# Patient Record
Sex: Female | Born: 1950 | Race: White | Hispanic: No | Smoking: Never smoker
Health system: Southern US, Community
[De-identification: ages and names within clinical notes are randomized; demographics above are authoritative.]

## PROBLEM LIST (undated history)

## (undated) DIAGNOSIS — F32A Depression, unspecified: Secondary | ICD-10-CM

## (undated) DIAGNOSIS — I509 Heart failure, unspecified: Secondary | ICD-10-CM

## (undated) DIAGNOSIS — I2510 Atherosclerotic heart disease of native coronary artery without angina pectoris: Secondary | ICD-10-CM

## (undated) DIAGNOSIS — E119 Type 2 diabetes mellitus without complications: Secondary | ICD-10-CM

## (undated) DIAGNOSIS — I4891 Unspecified atrial fibrillation: Secondary | ICD-10-CM

## (undated) DIAGNOSIS — F419 Anxiety disorder, unspecified: Secondary | ICD-10-CM

## (undated) DIAGNOSIS — K219 Gastro-esophageal reflux disease without esophagitis: Secondary | ICD-10-CM

## (undated) DIAGNOSIS — I499 Cardiac arrhythmia, unspecified: Secondary | ICD-10-CM

## (undated) DIAGNOSIS — I10 Essential (primary) hypertension: Secondary | ICD-10-CM

## (undated) DIAGNOSIS — E785 Hyperlipidemia, unspecified: Secondary | ICD-10-CM

## (undated) HISTORY — DX: Gastro-esophageal reflux disease without esophagitis: K21.9

## (undated) HISTORY — DX: Atherosclerotic heart disease of native coronary artery without angina pectoris: I25.10

## (undated) HISTORY — DX: Unspecified atrial fibrillation: I48.91

## (undated) HISTORY — DX: Hyperlipidemia, unspecified: E78.5

## (undated) HISTORY — PX: TRIGGER FINGER RELEASE: SHX641

## (undated) HISTORY — PX: CATARACT EXTRACTION, BILATERAL: SHX1313

## (undated) HISTORY — DX: Type 2 diabetes mellitus without complications: E11.9

## (undated) HISTORY — DX: Cardiac arrhythmia, unspecified: I49.9

## (undated) HISTORY — DX: Essential (primary) hypertension: I10

## (undated) HISTORY — DX: Depression, unspecified: F32.A

## (undated) HISTORY — PX: BELPHAROPTOSIS REPAIR: SHX369

## (undated) HISTORY — DX: Anxiety disorder, unspecified: F41.9

## (undated) HISTORY — DX: Heart failure, unspecified: I50.9

---

## 2005-09-27 ENCOUNTER — Ambulatory Visit: Payer: Self-pay | Admitting: Family Medicine

## 2006-02-15 ENCOUNTER — Ambulatory Visit: Payer: Self-pay | Admitting: Family Medicine

## 2008-04-30 ENCOUNTER — Ambulatory Visit: Payer: Self-pay | Admitting: Family Medicine

## 2013-11-06 DIAGNOSIS — IMO0001 Reserved for inherently not codable concepts without codable children: Secondary | ICD-10-CM | POA: Insufficient documentation

## 2014-08-04 DIAGNOSIS — H269 Unspecified cataract: Secondary | ICD-10-CM | POA: Insufficient documentation

## 2016-12-27 DIAGNOSIS — H2513 Age-related nuclear cataract, bilateral: Secondary | ICD-10-CM | POA: Insufficient documentation

## 2017-02-14 DIAGNOSIS — H2512 Age-related nuclear cataract, left eye: Secondary | ICD-10-CM | POA: Insufficient documentation

## 2017-07-10 DIAGNOSIS — H02403 Unspecified ptosis of bilateral eyelids: Secondary | ICD-10-CM | POA: Insufficient documentation

## 2018-08-20 ENCOUNTER — Ambulatory Visit
Admission: EM | Admit: 2018-08-20 | Discharge: 2018-08-20 | Disposition: A | Discharge status: Home / Self Care | Payer: BC Managed Care – PPO | Attending: Family Medicine | Admitting: Family Medicine

## 2018-08-20 ENCOUNTER — Other Ambulatory Visit: Payer: Self-pay

## 2018-08-20 DIAGNOSIS — R197 Diarrhea, unspecified: Secondary | ICD-10-CM

## 2018-08-20 LAB — COMPREHENSIVE METABOLIC PANEL
ALK PHOS: 103 U/L (ref 38–126)
ALT: 22 U/L (ref 0–44)
AST: 18 U/L (ref 15–41)
Albumin: 4.1 g/dL (ref 3.5–5.0)
Anion gap: 10 (ref 5–15)
BUN: 17 mg/dL (ref 8–23)
CO2: 25 mmol/L (ref 22–32)
CREATININE: 0.91 mg/dL (ref 0.44–1.00)
Calcium: 9.2 mg/dL (ref 8.9–10.3)
Chloride: 97 mmol/L — ABNORMAL LOW (ref 98–111)
GFR calc Af Amer: >60 mL/min (ref >60)
Glucose, Bld: 333 mg/dL — ABNORMAL HIGH (ref 70–99)
Potassium: 4 mmol/L (ref 3.5–5.1)
Sodium: 132 mmol/L — ABNORMAL LOW (ref 135–145)
Total Bilirubin: 0.4 mg/dL (ref 0.3–1.2)
Total Protein: 7.6 g/dL (ref 6.5–8.1)

## 2018-08-20 LAB — CBC WITH DIFFERENTIAL/PLATELET
BASOS ABS: 0.1 10*3/uL (ref 0–0.1)
Basophils Relative: 1 %
Eosinophils Absolute: 0.1 10*3/uL (ref 0–0.7)
Eosinophils Relative: 2 %
HEMATOCRIT: 44 % (ref 35.0–47.0)
HEMOGLOBIN: 15.1 g/dL (ref 12.0–16.0)
LYMPHS PCT: 23 %
Lymphs Abs: 1.7 10*3/uL (ref 1.0–3.6)
MCH: 29.6 pg (ref 26.0–34.0)
MCHC: 34.3 g/dL (ref 32.0–36.0)
MCV: 86.4 fL (ref 80.0–100.0)
MONO ABS: 0.6 10*3/uL (ref 0.2–0.9)
Monocytes Relative: 8 %
NEUTROS ABS: 5.1 10*3/uL (ref 1.4–6.5)
Neutrophils Relative %: 66 %
Platelets: 296 10*3/uL (ref 150–440)
RBC: 5.09 MIL/uL (ref 3.80–5.20)
RDW: 12.9 % (ref 11.5–14.5)
WBC: 7.6 10*3/uL (ref 3.6–11.0)

## 2018-08-20 NOTE — ED Triage Notes (Signed)
Patient complains of diarrhea x 3 weeks. Patient states that she had a 1/2 teaspoon of bad egg salad the day before symptoms started. Patient states that this has been occurring after eating. Patient states that she did not have any symptoms over the last weekend but symptoms returned last night.

## 2018-08-20 NOTE — Discharge Instructions (Signed)
Imodium AD over the counter as needed Clear liquids, then advance diet slowly as tolerated

## 2019-02-16 ENCOUNTER — Other Ambulatory Visit: Payer: Self-pay

## 2019-02-16 ENCOUNTER — Encounter: Payer: Self-pay | Admitting: Emergency Medicine

## 2019-02-16 ENCOUNTER — Emergency Department
Admission: EM | Admit: 2019-02-16 | Discharge: 2019-02-16 | Disposition: A | Discharge status: Home / Self Care | Payer: Medicare Other | Attending: Emergency Medicine | Admitting: Emergency Medicine

## 2019-02-16 DIAGNOSIS — E119 Type 2 diabetes mellitus without complications: Secondary | ICD-10-CM | POA: Diagnosis not present

## 2019-02-16 DIAGNOSIS — R197 Diarrhea, unspecified: Secondary | ICD-10-CM | POA: Diagnosis present

## 2019-02-16 DIAGNOSIS — Z794 Long term (current) use of insulin: Secondary | ICD-10-CM | POA: Insufficient documentation

## 2019-02-16 DIAGNOSIS — K529 Noninfective gastroenteritis and colitis, unspecified: Secondary | ICD-10-CM | POA: Diagnosis not present

## 2019-02-16 DIAGNOSIS — Z79899 Other long term (current) drug therapy: Secondary | ICD-10-CM | POA: Insufficient documentation

## 2019-02-16 LAB — COMPREHENSIVE METABOLIC PANEL
ALT: 12 U/L (ref 0–44)
ANION GAP: 12 (ref 5–15)
AST: 12 U/L — ABNORMAL LOW (ref 15–41)
Albumin: 3.6 g/dL (ref 3.5–5.0)
Alkaline Phosphatase: 70 U/L (ref 38–126)
BILIRUBIN TOTAL: 0.8 mg/dL (ref 0.3–1.2)
BUN: 21 mg/dL (ref 8–23)
CALCIUM: 8.7 mg/dL — AB (ref 8.9–10.3)
CO2: 21 mmol/L — ABNORMAL LOW (ref 22–32)
CREATININE: 0.84 mg/dL (ref 0.44–1.00)
Chloride: 101 mmol/L (ref 98–111)
Glucose, Bld: 445 mg/dL — ABNORMAL HIGH (ref 70–99)
Potassium: 4 mmol/L (ref 3.5–5.1)
Sodium: 134 mmol/L — ABNORMAL LOW (ref 135–145)
Total Protein: 7.1 g/dL (ref 6.5–8.1)

## 2019-02-16 LAB — URINALYSIS, COMPLETE (UACMP) WITH MICROSCOPIC
BILIRUBIN URINE: NEGATIVE
Bacteria, UA: NONE SEEN
HGB URINE DIPSTICK: NEGATIVE
Ketones, ur: 20 mg/dL — AB
LEUKOCYTE UA: NEGATIVE
NITRITE: NEGATIVE
PH: 6 (ref 5.0–8.0)
Protein, ur: NEGATIVE mg/dL
SPECIFIC GRAVITY, URINE: 1.022 (ref 1.005–1.030)

## 2019-02-16 LAB — CBC WITH DIFFERENTIAL/PLATELET
Abs Immature Granulocytes: 0.04 10*3/uL (ref 0.00–0.07)
BASOS PCT: 1 %
Basophils Absolute: 0 10*3/uL (ref 0.0–0.1)
EOS ABS: 0 10*3/uL (ref 0.0–0.5)
EOS PCT: 0 %
HEMATOCRIT: 37 % (ref 36.0–46.0)
Hemoglobin: 12.6 g/dL (ref 12.0–15.0)
Immature Granulocytes: 1 %
LYMPHS ABS: 0.7 10*3/uL (ref 0.7–4.0)
Lymphocytes Relative: 10 %
MCH: 29.5 pg (ref 26.0–34.0)
MCHC: 34.1 g/dL (ref 30.0–36.0)
MCV: 86.7 fL (ref 80.0–100.0)
MONO ABS: 0.3 10*3/uL (ref 0.1–1.0)
MONOS PCT: 4 %
Neutro Abs: 5.9 10*3/uL (ref 1.7–7.7)
Neutrophils Relative %: 84 %
Platelets: 258 10*3/uL (ref 150–400)
RBC: 4.27 MIL/uL (ref 3.87–5.11)
RDW: 12.7 % (ref 11.5–15.5)
WBC: 7 10*3/uL (ref 4.0–10.5)
nRBC: 0 % (ref 0.0–0.2)

## 2019-02-16 LAB — GLUCOSE, CAPILLARY
GLUCOSE-CAPILLARY: 389 mg/dL — AB (ref 70–99)
GLUCOSE-CAPILLARY: 329 mg/dL — AB (ref 70–99)

## 2019-02-16 LAB — TROPONIN I: Troponin I: <0.03 ng/mL (ref <0.03)

## 2019-02-16 MED ORDER — SODIUM CHLORIDE 0.9 % IV BOLUS
1000.0000 mL | Freq: Once | INTRAVENOUS | Status: AC
  Administered 2019-02-16: 1000 mL via INTRAVENOUS

## 2019-02-16 MED ORDER — ONDANSETRON HCL 4 MG/2ML IJ SOLN
4.0000 mg | Freq: Once | INTRAMUSCULAR | Status: AC
  Administered 2019-02-16: 4 mg via INTRAVENOUS
  Filled 2019-02-16: qty 2

## 2019-02-16 MED ORDER — SODIUM CHLORIDE 0.9 % IV BOLUS
500.0000 mL | Freq: Once | INTRAVENOUS | Status: AC
  Administered 2019-02-16: 500 mL via INTRAVENOUS

## 2019-02-16 MED ORDER — PROMETHAZINE HCL 25 MG/ML IJ SOLN: 12.5000 mg | Freq: Once | INTRAMUSCULAR | Status: DC

## 2019-02-16 MED ORDER — PROMETHAZINE HCL 12.5 MG PO TABS: 12.5000 mg | ORAL_TABLET | Freq: Four times a day (QID) | ORAL | 0 refills | Status: DC | PRN

## 2019-02-16 NOTE — ED Notes (Signed)
Pt verbalized understanding of discharge instructions. NAD at this time. 

## 2019-02-16 NOTE — ED Notes (Signed)
Dt Ginger Ale given for PO challenge.

## 2019-02-16 NOTE — Discharge Instructions (Signed)
Continue to  check your sugars and take your insulin as prescribed drink plenty of fluids that do not contain sugars, and return to the emergency room for any new or worrisome symptoms including abdominal pain vomiting or fever or bleeding.

## 2019-02-16 NOTE — ED Notes (Signed)
MD McShane at bedside  

## 2019-02-16 NOTE — ED Triage Notes (Signed)
Pt to ED by EMS with c/o of N/V and Hypoglycemia. Pt took 70/30 insulin at approx 11:30. Upon EMS arrival pt's CBG 430.

## 2019-02-16 NOTE — ED Provider Notes (Addendum)
[facility] Emergency Department Provider Note  ____________________________________________   I have reviewed the triage vital signs and the nursing notes. Where available I have reviewed prior notes and, if possible and indicated, outside hospital notes.    HISTORY  Chief Complaint Hyperglycemia    HPI Kylie Thomas is a 68 y.o. female  History of irritable bowel syndrome triggered by stress, history of diabetes mellitus with a hemoglobin A1c she describes as being around 8 or so, history of insulin-dependent diabetes, she had some diarrhea for last couple days and a few episodes of dry heaving today and she feels nauseated.  She denies any fever or chills, she does not any focal abdominal pain.  No melena bright red blood per rectum no hematemesis.  No other alleviating or aggravating symptoms.  No chest pain no exertional symptoms no shortness of breath. *   History reviewed. No pertinent past medical history.  There are no active problems to display for this patient.   Past Surgical History:  Procedure Laterality Date  . BELPHAROPTOSIS REPAIR    . CATARACT EXTRACTION, BILATERAL    . CESAREAN SECTION    . TRIGGER FINGER RELEASE     x 7    Prior to Admission medications   Medication Sig Start Date End Date Taking? Authorizing Provider  buPROPion (WELLBUTRIN XL) 150 MG 24 hr tablet Take 150 mg by mouth 2 (two) times daily. 06/03/18   [provider]  dorzolamide-timolol (COSOPT) 22.3-6.8 MG/ML ophthalmic solution INSTILL 1 DROP INTO AFFECTED EYE(S) TWICE DAILY 07/08/18   [provider]  DULoxetine (CYMBALTA) 60 MG capsule Take 60 mg by mouth daily. 06/03/18   [provider]  gabapentin (NEURONTIN) 300 MG capsule Take by mouth.    [provider]  glucose blood (ONE TOUCH ULTRA TEST) test strip OneTouch Ultra Blue Test Strip    [provider]  Insulin Detemir (LEVEMIR FLEXTOUCH) 100 UNIT/ML Pen every morning  before breakfast.    [provider]  rosuvastatin (CRESTOR) 10 MG tablet Take 10 mg by mouth daily. 06/12/18   [provider]  TRULICITY 1.5 MG/0.5ML SOPN INJECT 1 PEN WEEKLY AS DIRECTED 07/01/18   [provider]    Allergies Atorvastatin and Sulfa antibiotics  Family History  Problem Relation Age of Onset  . Heart disease Mother   . Diabetes Father   . Heart attack Father     Social History Social History   Tobacco Use  . Smoking status: Never Smoker  . Smokeless tobacco: Never Used  Substance Use Topics  . Alcohol use: Not Currently  . Drug use: Not Currently    Review of Systems Constitutional: No fever/chills Eyes: No visual changes. ENT: No sore throat. No stiff neck no neck pain Cardiovascular: Denies chest pain. Respiratory: Denies shortness of breath. Gastrointestinal:   no vomiting.  No diarrhea.  No constipation. Genitourinary: Negative for dysuria. Musculoskeletal: Negative lower extremity swelling Skin: Negative for rash. Neurological: Negative for severe headaches, focal weakness or numbness.   ____________________________________________   PHYSICAL EXAM:  VITAL SIGNS: ED Triage Vitals  Enc Vitals Group     BP 02/16/19 1306 (!) 180/87     Pulse Rate 02/16/19 1306 (!) 103     Resp 02/16/19 1306 14     Temp 02/16/19 1306 97.6 F (36.4 C)     Temp Source 02/16/19 1306 Oral     SpO2 02/16/19 1306 100 %     Weight 02/16/19 1301 105 lb (47.6  kg)     Height 02/16/19 1301 5' (1.524 m)     Head Circumference --      Peak Flow --      Pain Score 02/16/19 1259 0     Pain Loc --      Pain Edu? --      Excl. in GC? --     Constitutional: Alert and oriented. Well appearing and in no acute distress. Eyes: Conjunctivae are normal Head: Atraumatic HEENT: No congestion/rhinnorhea. Mucous membranes are moist.  Oropharynx non-erythematous Neck:   Nontender with no meningismus, no masses, no stridor Cardiovascular: Normal rate,  regular rhythm. Grossly normal heart sounds.  Good peripheral circulation. Respiratory: Normal respiratory effort.  No retractions. Lungs CTAB. Abdominal: Soft and nontender. No distention. No guarding no rebound Back:  There is no focal tenderness or step off.  there is no midline tenderness there are no lesions noted. there is no CVA tenderness Musculoskeletal: No lower extremity tenderness, no upper extremity tenderness. No joint effusions, no DVT signs strong distal pulses no edema Neurologic:  Normal speech and language. No gross focal neurologic deficits are appreciated.  Skin:  Skin is warm, dry and intact. No rash noted. Psychiatric: Mood and affect are normal. Speech and behavior are normal.  ____________________________________________   LABS (all labs ordered are listed, but only abnormal results are displayed)  Labs Reviewed  GLUCOSE, CAPILLARY - Abnormal; Notable for the following components:      Result Value   Glucose-Capillary 389 (*)    All other components within normal limits  BLOOD GAS, VENOUS - Abnormal; Notable for the following components:   pCO2, Ven 40 (*)    All other components within normal limits  CBC WITH DIFFERENTIAL/PLATELET  COMPREHENSIVE METABOLIC PANEL  URINALYSIS, COMPLETE (UACMP) WITH MICROSCOPIC  TROPONIN I    Pertinent labs  results that were available during my care of the patient were reviewed by me and considered in my medical decision making (see chart for details). ____________________________________________  EKG  I personally interpreted any EKGs ordered by me or triage Sinus rhythm rate 103, tachycardia noted, normal axis, no acute ST elevation or depression.  Borderline prolonged QT ____________________________________________  RADIOLOGY  Pertinent labs & imaging results that were available during my care of the patient were reviewed by me and considered in my medical decision making (see chart for details). If possible, patient  and/or family made aware of any abnormal findings.  No results found. ____________________________________________    PROCEDURES  Procedure(s) performed: None  Procedures  Critical Care performed: None  ____________________________________________   INITIAL IMPRESSION / ASSESSMENT AND PLAN / ED COURSE  Pertinent labs & imaging results that were available during my care of the patient were reviewed by me and considered in my medical decision making (see chart for details).  Patient here with nausea and vomiting, abdomen is benign, she seems somewhat anxious but otherwise well appearing, she has not actually vomited here she states she feels nauseated.  Low suspicion for ACS but we did send cardiac enzymes and EKG.  Belly is nonsurgical.  She is with diarrhea and vomiting large community burden of same this is most likely a viral pathology we will see if we get her feeling better.  His blood sugars are somewhat elevated here,, we will send a VBG to ensure that she is not in DKA and we will give her IV fluids.  ----------------------------------------- 2:51 PM on 02/16/2019 -----------------------------------------  Peers trending down patient is in no acute distress  tolerating p.o. requesting discharge, abdomen remains benign.  We talked about admission versus discharge she is strongly of the opinion that she would like to go home.  No evidence of DKA, she has not actually vomited or had any diarrhea here.  She looks quite well and she is texting on her telephone and requesting discharge.  We will discharge her with close outpatient follow-up return precautions given and understood.  Her blood sugar somewhat elevated still however it is trending down and again I do not want a be too aggressive in dropping it as she will take her insulin soon this evening.   ____________________________________________   FINAL CLINICAL IMPRESSION(S) / ED DIAGNOSES  Final diagnoses:  None       This chart was dictated using voice recognition software.  Despite best efforts to proofread,  errors can occur which can change meaning.      Jeanmarie PlantMcShane,  A, MD 02/16/19 1347    Jeanmarie PlantMcShane,  A, MD 02/16/19 [phone]

## 2019-02-25 LAB — BLOOD GAS, VENOUS
Bicarbonate: 23.7 mmol/L (ref 20.0–28.0)
PCO2 VEN: 40 mmHg — AB (ref 44.0–60.0)
pH, Ven: 7.38 (ref 7.250–7.430)

## 2020-04-05 ENCOUNTER — Ambulatory Visit: Payer: Self-pay | Admitting: Podiatry

## 2020-04-14 ENCOUNTER — Encounter: Payer: Self-pay | Admitting: Podiatry

## 2020-04-14 ENCOUNTER — Other Ambulatory Visit: Payer: Self-pay

## 2020-04-14 ENCOUNTER — Ambulatory Visit: Payer: Medicare PPO | Admitting: Podiatry

## 2020-04-14 DIAGNOSIS — M79676 Pain in unspecified toe(s): Secondary | ICD-10-CM | POA: Diagnosis not present

## 2020-04-14 DIAGNOSIS — B351 Tinea unguium: Secondary | ICD-10-CM

## 2020-04-14 DIAGNOSIS — M6530 Trigger finger, unspecified finger: Secondary | ICD-10-CM | POA: Insufficient documentation

## 2020-04-14 DIAGNOSIS — M19049 Primary osteoarthritis, unspecified hand: Secondary | ICD-10-CM | POA: Insufficient documentation

## 2020-04-14 NOTE — Progress Notes (Signed)
  Subjective:  Patient ID: Kylie Thomas, female    DOB: 1951-02-14,  MRN: [id number] HPI Chief Complaint  Patient presents with  . Diabetes    Foot exam - trim nails if needed today and would like to start coming every 6 months for nail care  . New Patient (Initial Visit)    69 y.o. female presents with the above complaint.   ROS: Denies fever chills nausea vomiting muscle aches pains calf pain back pain chest pain or headache.  No past medical history on file. Past Surgical History:  Procedure Laterality Date  . BELPHAROPTOSIS REPAIR    . CATARACT EXTRACTION, BILATERAL    . CESAREAN SECTION    . TRIGGER FINGER RELEASE     x 7    Current Outpatient Medications:  .  aspirin 325 MG tablet, Take 325 mg by mouth daily., Disp: , Rfl:  .  lisinopril (ZESTRIL) 10 MG tablet, Take 10 mg by mouth daily., Disp: , Rfl:  .  pantoprazole (PROTONIX) 20 MG tablet, Take 20 mg by mouth daily., Disp: , Rfl:  .  Pioglitazone HCl (ACTOS PO), Take by mouth., Disp: , Rfl:  .  buPROPion (WELLBUTRIN XL) 150 MG 24 hr tablet, Take 150 mg by mouth 2 (two) times daily., Disp: , Rfl: 2 .  dorzolamide-timolol (COSOPT) 22.3-6.8 MG/ML ophthalmic solution, INSTILL 1 DROP INTO AFFECTED EYE(S) TWICE DAILY, Disp: , Rfl: 0 .  DULoxetine (CYMBALTA) 60 MG capsule, Take 60 mg by mouth daily., Disp: , Rfl: 5 .  gabapentin (NEURONTIN) 300 MG capsule, Take by mouth., Disp: , Rfl:  .  glucose blood (ONE TOUCH ULTRA TEST) test strip, OneTouch Ultra Blue Test Strip, Disp: , Rfl:  .  Insulin Detemir (LEVEMIR FLEXTOUCH) 100 UNIT/ML Pen, every morning before breakfast., Disp: , Rfl:  .  TRULICITY 1.5 MG/0.5ML SOPN, INJECT 1 PEN WEEKLY AS DIRECTED, Disp: , Rfl: 5  Allergies  Allergen Reactions  . Atorvastatin Other (See Comments)    Other reaction(s): Arthralgia (Joint Pain) Severe cramps  . Remifentanil     Other reaction(s): Irregular Heart Rate  . Fentanyl   . Sulfa Antibiotics Nausea Only and Other (See Comments)    Review of Systems Objective:  There were no vitals filed for this visit.  General: Well developed, nourished, in no acute distress, alert and oriented x3   Dermatological: Skin is warm, dry and supple bilateral. Nails x 10 are thick yellow and dystrophic possibly mycotic.; remaining integument appears unremarkable at this time. There are no open sores, no preulcerative lesions, no rash or signs of infection present.  Vascular: Dorsalis Pedis artery and Posterior Tibial artery pedal pulses are 2/4 bilateral with immedate capillary fill time. Pedal hair growth present. No varicosities and no lower extremity edema present bilateral.   Neruologic: Grossly intact via light touch bilateral. Vibratory intact via tuning fork bilateral. Protective threshold with Semmes Wienstein monofilament intact to all pedal sites bilateral. Patellar and Achilles deep tendon reflexes 2+ bilateral. No Babinski or clonus noted bilateral.   Musculoskeletal: No gross boney pedal deformities bilateral. No pain, crepitus, or limitation noted with foot and ankle range of motion bilateral. Muscular strength 5/5 in all groups tested bilateral.  Gait: Unassisted, Nonantalgic.    Radiographs:  None taken  Assessment & Plan:   Assessment: Pain in limb secondary to onychomycosis.  Diabetes mellitus type 2 without complications.  Plan: Debrided toenails 1 through 5 bilateral.     Worth Kober T. [place], [place]

## 2020-10-18 ENCOUNTER — Ambulatory Visit: Payer: Medicare PPO | Admitting: Podiatry

## 2021-01-19 ENCOUNTER — Ambulatory Visit: Payer: Medicare PPO | Admitting: Podiatry

## 2021-05-15 ENCOUNTER — Inpatient Hospital Stay (HOSPITAL_COMMUNITY)
Admit: 2021-05-15 | Discharge: 2021-05-15 | Disposition: A | Discharge status: Home / Self Care | Payer: Medicare HMO | Attending: Internal Medicine | Admitting: Internal Medicine

## 2021-05-15 ENCOUNTER — Inpatient Hospital Stay
Admission: EM | Admit: 2021-05-15 | Discharge: 2021-05-19 | DRG: 286 | Disposition: A | Discharge status: Home / Self Care | Payer: Medicare HMO | Attending: Internal Medicine | Admitting: Internal Medicine

## 2021-05-15 ENCOUNTER — Emergency Department: Payer: Medicare HMO

## 2021-05-15 DIAGNOSIS — Z882 Allergy status to sulfonamides status: Secondary | ICD-10-CM

## 2021-05-15 DIAGNOSIS — I252 Old myocardial infarction: Secondary | ICD-10-CM | POA: Diagnosis not present

## 2021-05-15 DIAGNOSIS — E1169 Type 2 diabetes mellitus with other specified complication: Secondary | ICD-10-CM | POA: Diagnosis present

## 2021-05-15 DIAGNOSIS — I5043 Acute on chronic combined systolic (congestive) and diastolic (congestive) heart failure: Secondary | ICD-10-CM | POA: Diagnosis present

## 2021-05-15 DIAGNOSIS — R6510 Systemic inflammatory response syndrome (SIRS) of non-infectious origin without acute organ dysfunction: Secondary | ICD-10-CM | POA: Diagnosis not present

## 2021-05-15 DIAGNOSIS — Z8249 Family history of ischemic heart disease and other diseases of the circulatory system: Secondary | ICD-10-CM

## 2021-05-15 DIAGNOSIS — T68XXXA Hypothermia, initial encounter: Secondary | ICD-10-CM | POA: Diagnosis present

## 2021-05-15 DIAGNOSIS — N1831 Chronic kidney disease, stage 3a: Secondary | ICD-10-CM | POA: Diagnosis present

## 2021-05-15 DIAGNOSIS — Z833 Family history of diabetes mellitus: Secondary | ICD-10-CM

## 2021-05-15 DIAGNOSIS — Z87891 Personal history of nicotine dependence: Secondary | ICD-10-CM | POA: Diagnosis not present

## 2021-05-15 DIAGNOSIS — I480 Paroxysmal atrial fibrillation: Secondary | ICD-10-CM | POA: Diagnosis present

## 2021-05-15 DIAGNOSIS — Z7982 Long term (current) use of aspirin: Secondary | ICD-10-CM

## 2021-05-15 DIAGNOSIS — I5023 Acute on chronic systolic (congestive) heart failure: Secondary | ICD-10-CM

## 2021-05-15 DIAGNOSIS — E1165 Type 2 diabetes mellitus with hyperglycemia: Secondary | ICD-10-CM | POA: Diagnosis present

## 2021-05-15 DIAGNOSIS — Z955 Presence of coronary angioplasty implant and graft: Secondary | ICD-10-CM

## 2021-05-15 DIAGNOSIS — K219 Gastro-esophageal reflux disease without esophagitis: Secondary | ICD-10-CM | POA: Diagnosis present

## 2021-05-15 DIAGNOSIS — N183 Chronic kidney disease, stage 3 unspecified: Secondary | ICD-10-CM | POA: Diagnosis present

## 2021-05-15 DIAGNOSIS — Z79899 Other long term (current) drug therapy: Secondary | ICD-10-CM

## 2021-05-15 DIAGNOSIS — Z7901 Long term (current) use of anticoagulants: Secondary | ICD-10-CM

## 2021-05-15 DIAGNOSIS — Z794 Long term (current) use of insulin: Secondary | ICD-10-CM

## 2021-05-15 DIAGNOSIS — Z7902 Long term (current) use of antithrombotics/antiplatelets: Secondary | ICD-10-CM

## 2021-05-15 DIAGNOSIS — I482 Chronic atrial fibrillation, unspecified: Secondary | ICD-10-CM | POA: Diagnosis present

## 2021-05-15 DIAGNOSIS — J9601 Acute respiratory failure with hypoxia: Secondary | ICD-10-CM | POA: Diagnosis not present

## 2021-05-15 DIAGNOSIS — N390 Urinary tract infection, site not specified: Secondary | ICD-10-CM | POA: Diagnosis present

## 2021-05-15 DIAGNOSIS — E785 Hyperlipidemia, unspecified: Secondary | ICD-10-CM | POA: Diagnosis not present

## 2021-05-15 DIAGNOSIS — F418 Other specified anxiety disorders: Secondary | ICD-10-CM | POA: Diagnosis present

## 2021-05-15 DIAGNOSIS — E1122 Type 2 diabetes mellitus with diabetic chronic kidney disease: Secondary | ICD-10-CM | POA: Diagnosis present

## 2021-05-15 DIAGNOSIS — E1129 Type 2 diabetes mellitus with other diabetic kidney complication: Secondary | ICD-10-CM | POA: Diagnosis present

## 2021-05-15 DIAGNOSIS — J9621 Acute and chronic respiratory failure with hypoxia: Secondary | ICD-10-CM | POA: Diagnosis present

## 2021-05-15 DIAGNOSIS — F419 Anxiety disorder, unspecified: Secondary | ICD-10-CM | POA: Diagnosis present

## 2021-05-15 DIAGNOSIS — I130 Hypertensive heart and chronic kidney disease with heart failure and stage 1 through stage 4 chronic kidney disease, or unspecified chronic kidney disease: Secondary | ICD-10-CM | POA: Diagnosis present

## 2021-05-15 DIAGNOSIS — Z9981 Dependence on supplemental oxygen: Secondary | ICD-10-CM

## 2021-05-15 DIAGNOSIS — F32A Depression, unspecified: Secondary | ICD-10-CM | POA: Diagnosis present

## 2021-05-15 DIAGNOSIS — R778 Other specified abnormalities of plasma proteins: Secondary | ICD-10-CM | POA: Diagnosis present

## 2021-05-15 DIAGNOSIS — I2510 Atherosclerotic heart disease of native coronary artery without angina pectoris: Secondary | ICD-10-CM | POA: Diagnosis present

## 2021-05-15 DIAGNOSIS — I10 Essential (primary) hypertension: Secondary | ICD-10-CM | POA: Diagnosis present

## 2021-05-15 DIAGNOSIS — Z20822 Contact with and (suspected) exposure to covid-19: Secondary | ICD-10-CM | POA: Diagnosis present

## 2021-05-15 DIAGNOSIS — I509 Heart failure, unspecified: Secondary | ICD-10-CM | POA: Insufficient documentation

## 2021-05-15 DIAGNOSIS — D72829 Elevated white blood cell count, unspecified: Secondary | ICD-10-CM | POA: Diagnosis present

## 2021-05-15 DIAGNOSIS — I2584 Coronary atherosclerosis due to calcified coronary lesion: Secondary | ICD-10-CM | POA: Diagnosis present

## 2021-05-15 DIAGNOSIS — E860 Dehydration: Secondary | ICD-10-CM | POA: Diagnosis present

## 2021-05-15 DIAGNOSIS — A419 Sepsis, unspecified organism: Secondary | ICD-10-CM | POA: Diagnosis present

## 2021-05-15 DIAGNOSIS — Z888 Allergy status to other drugs, medicaments and biological substances status: Secondary | ICD-10-CM

## 2021-05-15 LAB — RESP PANEL BY RT-PCR (FLU A&B, COVID) ARPGX2
Influenza A by PCR: NEGATIVE
Influenza B by PCR: NEGATIVE
SARS Coronavirus 2 by RT PCR: NEGATIVE

## 2021-05-15 LAB — COMPREHENSIVE METABOLIC PANEL
ALT: 16 U/L (ref 0–44)
AST: 22 U/L (ref 15–41)
Albumin: 3.3 g/dL — ABNORMAL LOW (ref 3.5–5.0)
Alkaline Phosphatase: 104 U/L (ref 38–126)
Anion gap: 16 — ABNORMAL HIGH (ref 5–15)
BUN: 42 mg/dL — ABNORMAL HIGH (ref 8–23)
CO2: 20 mmol/L — ABNORMAL LOW (ref 22–32)
Calcium: 8.5 mg/dL — ABNORMAL LOW (ref 8.9–10.3)
Chloride: 96 mmol/L — ABNORMAL LOW (ref 98–111)
Creatinine, Ser: 1.7 mg/dL — ABNORMAL HIGH (ref 0.44–1.00)
GFR, Estimated: 32 mL/min — ABNORMAL LOW (ref >60)
Glucose, Bld: 378 mg/dL — ABNORMAL HIGH (ref 70–99)
Potassium: 3.8 mmol/L (ref 3.5–5.1)
Sodium: 132 mmol/L — ABNORMAL LOW (ref 135–145)
Total Bilirubin: 0.5 mg/dL (ref 0.3–1.2)
Total Protein: 7.2 g/dL (ref 6.5–8.1)

## 2021-05-15 LAB — PROCALCITONIN
Procalcitonin: <0.1 ng/mL
Procalcitonin: <0.1 ng/mL

## 2021-05-15 LAB — URINALYSIS, COMPLETE (UACMP) WITH MICROSCOPIC
Bilirubin Urine: NEGATIVE
Glucose, UA: >=500 mg/dL — AB
Hgb urine dipstick: NEGATIVE
Ketones, ur: NEGATIVE mg/dL
Nitrite: NEGATIVE
Protein, ur: NEGATIVE mg/dL
Specific Gravity, Urine: 1.009 (ref 1.005–1.030)
Squamous Epithelial / HPF: NONE SEEN (ref 0–5)
pH: 5 (ref 5.0–8.0)

## 2021-05-15 LAB — CBG MONITORING, ED
Glucose-Capillary: 342 mg/dL — ABNORMAL HIGH (ref 70–99)
Glucose-Capillary: 320 mg/dL — ABNORMAL HIGH (ref 70–99)
Glucose-Capillary: 270 mg/dL — ABNORMAL HIGH (ref 70–99)

## 2021-05-15 LAB — URINE DRUG SCREEN, QUALITATIVE (ARMC ONLY)
Amphetamines, Ur Screen: NOT DETECTED
Barbiturates, Ur Screen: NOT DETECTED
Benzodiazepine, Ur Scrn: NOT DETECTED
Cannabinoid 50 Ng, Ur ~~LOC~~: NOT DETECTED
Cocaine Metabolite,Ur ~~LOC~~: NOT DETECTED
MDMA (Ecstasy)Ur Screen: NOT DETECTED
Methadone Scn, Ur: NOT DETECTED
Opiate, Ur Screen: NOT DETECTED
Phencyclidine (PCP) Ur S: NOT DETECTED
Tricyclic, Ur Screen: NOT DETECTED

## 2021-05-15 LAB — TROPONIN I (HIGH SENSITIVITY)
Troponin I (High Sensitivity): 88 ng/L — ABNORMAL HIGH (ref <18)
Troponin I (High Sensitivity): 73 ng/L — ABNORMAL HIGH (ref <18)
Troponin I (High Sensitivity): 81 ng/L — ABNORMAL HIGH (ref <18)
Troponin I (High Sensitivity): 79 ng/L — ABNORMAL HIGH (ref <18)

## 2021-05-15 LAB — LACTIC ACID, PLASMA: Lactic Acid, Venous: 1.4 mmol/L (ref 0.5–1.9)

## 2021-05-15 LAB — ECHOCARDIOGRAM COMPLETE
AR max vel: 1.76 cm2
AV Area VTI: 1.72 cm2
AV Area mean vel: 1.8 cm2
AV Mean grad: 3 mmHg
AV Peak grad: 5.5 mmHg
Ao pk vel: 1.17 m/s
Area-P 1/2: 3.06 cm2
Calc EF: 53.8 %
Height: 60 in
MV VTI: 0.92 cm2
P 1/2 time: 719 msec
S' Lateral: 2.5 cm
Single Plane A2C EF: 51.9 %
Single Plane A4C EF: 54.1 %
Weight: 1744 oz

## 2021-05-15 LAB — CBC
HCT: 39.3 % (ref 36.0–46.0)
Hemoglobin: 12.9 g/dL (ref 12.0–15.0)
MCH: 30.1 pg (ref 26.0–34.0)
MCHC: 32.8 g/dL (ref 30.0–36.0)
MCV: 91.6 fL (ref 80.0–100.0)
Platelets: 408 10*3/uL — ABNORMAL HIGH (ref 150–400)
RBC: 4.29 MIL/uL (ref 3.87–5.11)
RDW: 12.9 % (ref 11.5–15.5)
WBC: 15.4 10*3/uL — ABNORMAL HIGH (ref 4.0–10.5)
nRBC: 0 % (ref 0.0–0.2)

## 2021-05-15 LAB — PROTIME-INR
INR: 1.3 — ABNORMAL HIGH (ref 0.8–1.2)
Prothrombin Time: 15.9 seconds — ABNORMAL HIGH (ref 11.4–15.2)

## 2021-05-15 LAB — APTT: aPTT: 30 seconds (ref 24–36)

## 2021-05-15 LAB — BRAIN NATRIURETIC PEPTIDE: B Natriuretic Peptide: 1372.8 pg/mL — ABNORMAL HIGH (ref 0.0–100.0)

## 2021-05-15 LAB — GLUCOSE, CAPILLARY: Glucose-Capillary: 179 mg/dL — ABNORMAL HIGH (ref 70–99)

## 2021-05-15 LAB — HIV ANTIBODY (ROUTINE TESTING W REFLEX): HIV Screen 4th Generation wRfx: NONREACTIVE

## 2021-05-15 MED ORDER — INSULIN ASPART 100 UNIT/ML IJ SOLN
0.0000 [IU] | Freq: Three times a day (TID) | INTRAMUSCULAR | Status: DC
  Administered 2021-05-15: 5 [IU] via SUBCUTANEOUS
  Administered 2021-05-15: 7 [IU] via SUBCUTANEOUS
  Administered 2021-05-16: 1 [IU] via SUBCUTANEOUS
  Administered 2021-05-16: 5 [IU] via SUBCUTANEOUS
  Administered 2021-05-16: 1 [IU] via SUBCUTANEOUS
  Administered 2021-05-17: 5 [IU] via SUBCUTANEOUS
  Administered 2021-05-17 (×2): 3 [IU] via SUBCUTANEOUS
  Administered 2021-05-18 (×2): 5 [IU] via SUBCUTANEOUS
  Administered 2021-05-19: 3 [IU] via SUBCUTANEOUS
  Filled 2021-05-15 (×11): qty 1

## 2021-05-15 MED ORDER — METOPROLOL SUCCINATE ER 25 MG PO TB24
25.0000 mg | ORAL_TABLET | Freq: Two times a day (BID) | ORAL | Status: DC
  Administered 2021-05-15: 25 mg via ORAL
  Filled 2021-05-15 (×3): qty 1

## 2021-05-15 MED ORDER — DORZOLAMIDE HCL-TIMOLOL MAL 2-0.5 % OP SOLN
1.0000 [drp] | Freq: Two times a day (BID) | OPHTHALMIC | Status: DC
  Administered 2021-05-15 – 2021-05-19 (×8): 1 [drp] via OPHTHALMIC
  Filled 2021-05-15 (×2): qty 10

## 2021-05-15 MED ORDER — NITROGLYCERIN 0.4 MG SL SUBL: 0.4000 mg | SUBLINGUAL_TABLET | SUBLINGUAL | Status: DC | PRN

## 2021-05-15 MED ORDER — SODIUM CHLORIDE 0.9 % IV SOLN
1.0000 g | INTRAVENOUS | Status: DC
  Administered 2021-05-15: 1 g via INTRAVENOUS
  Filled 2021-05-15 (×2): qty 10

## 2021-05-15 MED ORDER — INSULIN GLARGINE 100 UNIT/ML ~~LOC~~ SOLN
10.0000 [IU] | Freq: Every day | SUBCUTANEOUS | Status: DC
  Administered 2021-05-15 – 2021-05-19 (×3): 10 [IU] via SUBCUTANEOUS
  Filled 2021-05-15 (×5): qty 0.1

## 2021-05-15 MED ORDER — METOPROLOL SUCCINATE ER 50 MG PO TB24: 50.0000 mg | ORAL_TABLET | Freq: Two times a day (BID) | ORAL | Status: DC

## 2021-05-15 MED ORDER — FUROSEMIDE 10 MG/ML IJ SOLN: 40.0000 mg | Freq: Two times a day (BID) | INTRAMUSCULAR | Status: DC

## 2021-05-15 MED ORDER — FUROSEMIDE 10 MG/ML IJ SOLN
40.0000 mg | Freq: Two times a day (BID) | INTRAMUSCULAR | Status: DC
  Administered 2021-05-16: 40 mg via INTRAVENOUS
  Filled 2021-05-15: qty 4

## 2021-05-15 MED ORDER — LORATADINE 10 MG PO TABS: 10.0000 mg | ORAL_TABLET | Freq: Every day | ORAL | Status: DC | PRN

## 2021-05-15 MED ORDER — PANTOPRAZOLE SODIUM 20 MG PO TBEC
20.0000 mg | DELAYED_RELEASE_TABLET | Freq: Every day | ORAL | Status: DC
  Administered 2021-05-15 – 2021-05-19 (×5): 20 mg via ORAL
  Filled 2021-05-15 (×5): qty 1

## 2021-05-15 MED ORDER — ACETAMINOPHEN 325 MG PO TABS
650.0000 mg | ORAL_TABLET | Freq: Four times a day (QID) | ORAL | Status: DC | PRN
  Administered 2021-05-17: 650 mg via ORAL
  Filled 2021-05-15: qty 2

## 2021-05-15 MED ORDER — HEPARIN BOLUS VIA INFUSION
3000.0000 [IU] | Freq: Once | INTRAVENOUS | Status: DC
  Filled 2021-05-15: qty 3000

## 2021-05-15 MED ORDER — MIRTAZAPINE 15 MG PO TABS
7.5000 mg | ORAL_TABLET | Freq: Every day | ORAL | Status: DC
  Administered 2021-05-15 – 2021-05-18 (×4): 7.5 mg via ORAL
  Filled 2021-05-15 (×4): qty 1

## 2021-05-15 MED ORDER — INSULIN ASPART 100 UNIT/ML IJ SOLN
0.0000 [IU] | Freq: Every day | INTRAMUSCULAR | Status: DC
Start: 2021-05-15 — End: 2021-05-19
  Administered 2021-05-16: 5 [IU] via SUBCUTANEOUS
  Filled 2021-05-15: qty 1

## 2021-05-15 MED ORDER — GABAPENTIN 300 MG PO CAPS: 300.0000 mg | ORAL_CAPSULE | Freq: Every day | ORAL | Status: DC | PRN

## 2021-05-15 MED ORDER — FUROSEMIDE 10 MG/ML IJ SOLN
20.0000 mg | Freq: Once | INTRAMUSCULAR | Status: AC
  Administered 2021-05-15: 20 mg via INTRAVENOUS
  Filled 2021-05-15: qty 2

## 2021-05-15 MED ORDER — ONDANSETRON HCL 4 MG/2ML IJ SOLN
4.0000 mg | Freq: Three times a day (TID) | INTRAMUSCULAR | Status: DC | PRN
  Filled 2021-05-15: qty 2

## 2021-05-15 MED ORDER — AMIODARONE HCL 200 MG PO TABS
200.0000 mg | ORAL_TABLET | Freq: Every day | ORAL | Status: DC
  Administered 2021-05-15 – 2021-05-19 (×5): 200 mg via ORAL
  Filled 2021-05-15 (×5): qty 1

## 2021-05-15 MED ORDER — ONDANSETRON HCL 4 MG/2ML IJ SOLN
INTRAMUSCULAR | Status: AC
  Administered 2021-05-15: 4 mg via INTRAVENOUS
  Filled 2021-05-15: qty 2

## 2021-05-15 MED ORDER — ROSUVASTATIN CALCIUM 10 MG PO TABS
40.0000 mg | ORAL_TABLET | Freq: Every morning | ORAL | Status: DC
  Administered 2021-05-15 – 2021-05-19 (×5): 40 mg via ORAL
  Filled 2021-05-15 (×2): qty 4
  Filled 2021-05-15: qty 2
  Filled 2021-05-15 (×2): qty 4

## 2021-05-15 MED ORDER — ONDANSETRON HCL 4 MG/2ML IJ SOLN: 4.0000 mg | Freq: Once | INTRAMUSCULAR | Status: AC

## 2021-05-15 MED ORDER — ALBUTEROL SULFATE (2.5 MG/3ML) 0.083% IN NEBU: 3.0000 mL | INHALATION_SOLUTION | RESPIRATORY_TRACT | Status: DC | PRN

## 2021-05-15 MED ORDER — APIXABAN 5 MG PO TABS
5.0000 mg | ORAL_TABLET | Freq: Two times a day (BID) | ORAL | Status: DC
  Administered 2021-05-15 – 2021-05-16 (×3): 5 mg via ORAL
  Filled 2021-05-15 (×3): qty 1

## 2021-05-15 MED ORDER — MAGNESIUM OXIDE 400 MG PO TABS
400.0000 mg | ORAL_TABLET | Freq: Every day | ORAL | Status: DC
  Administered 2021-05-15: 400 mg via ORAL
  Filled 2021-05-15 (×4): qty 1

## 2021-05-15 MED ORDER — HEPARIN (PORCINE) 25000 UT/250ML-% IV SOLN: 600.0000 [IU]/h | INTRAVENOUS | Status: DC

## 2021-05-15 MED ORDER — FUROSEMIDE 10 MG/ML IJ SOLN
20.0000 mg | Freq: Once | INTRAMUSCULAR | Status: AC
  Administered 2021-05-15: 20 mg via INTRAVENOUS
  Filled 2021-05-15: qty 4

## 2021-05-15 MED ORDER — DULOXETINE HCL 30 MG PO CPEP
60.0000 mg | ORAL_CAPSULE | Freq: Every day | ORAL | Status: DC
  Administered 2021-05-15 – 2021-05-19 (×5): 60 mg via ORAL
  Filled 2021-05-15: qty 2
  Filled 2021-05-15: qty 1
  Filled 2021-05-15 (×3): qty 2

## 2021-05-15 MED ORDER — HYDRALAZINE HCL 20 MG/ML IJ SOLN: 5.0000 mg | INTRAMUSCULAR | Status: DC | PRN

## 2021-05-15 MED ORDER — CLOPIDOGREL BISULFATE 75 MG PO TABS
75.0000 mg | ORAL_TABLET | Freq: Every day | ORAL | Status: DC
  Administered 2021-05-15 – 2021-05-19 (×5): 75 mg via ORAL
  Filled 2021-05-15 (×5): qty 1

## 2021-05-15 NOTE — ED Triage Notes (Addendum)
Pt arrives with rales, orthopnea. Pt is nauseated. Pt appears in distress. Pt pale.80%^per ems pox at home 74%, pt on 3lpm with pox improved to 92%.

## 2021-05-15 NOTE — ED Notes (Signed)
Pt tolerating bipap in triage well.

## 2021-05-15 NOTE — ED Notes (Signed)
Pt refused purewick.

## 2021-05-15 NOTE — Plan of Care (Signed)
  Problem: Activity: Goal: Risk for activity intolerance will decrease Outcome: Progressing   Problem: Nutrition: Goal: Adequate nutrition will be maintained Outcome: Progressing   Problem: Coping: Goal: Level of anxiety will decrease Outcome: Progressing   

## 2021-05-15 NOTE — ED Provider Notes (Signed)
[facility] Emergency Department Provider Note  ____________________________________________  Time seen: Approximately 7:18 AM  I have reviewed the triage vital signs and the nursing notes.   HISTORY  Chief Complaint Respiratory Distress   HPI Kylie Thomas is a 70 y.o. female with history of STEMI 2 weeks ago with severe CAD status post PCI to the mid circumflex with a drug-eluting stent on Plavix, paroxysmal atrial fibrillation on Eliquis, diabetes, former smoker who presents for evaluation of shortness of breath.  Patient reports intermittent shortness of breath with exertion and orthopnea since being discharged after her STEMI.  Last week she was told by her doctor that she looked dehydrated and therefore her Lasix was cut in half.  Over the last 48 hours she reports progressively worsening shortness of breath and orthopnea with became severe this evening.  Patient reports that she was not able to sleep last night at all.  She has a cough productive of clear phlegm.  She denies chest pain, fever chills, nausea or vomiting.  She denies missing any of her doses of Eliquis or Plavix.  She denies any personal or family history of PE or DVT, hemoptysis, exogenous hormones, leg pain or swelling.  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 Authorizing Provider  aspirin 325 MG tablet Take 325 mg by mouth daily.    [provider]  buPROPion (WELLBUTRIN XL) 150 MG 24 hr tablet Take 150 mg by mouth 2 (two) times daily. 06/03/18   [provider]  dorzolamide-timolol (COSOPT) 22.3-6.8 MG/ML ophthalmic solution INSTILL 1 DROP INTO AFFECTED EYE(S) TWICE DAILY 07/08/18   [provider]  DULoxetine (CYMBALTA) 60 MG capsule Take 60 mg by mouth daily. 06/03/18   [provider]  gabapentin (NEURONTIN) 300 MG capsule Take by mouth.    [provider]  glucose blood (ONE TOUCH ULTRA TEST) test strip OneTouch Ultra Blue Test Strip    [provider]  Insulin Detemir (LEVEMIR FLEXTOUCH) 100 UNIT/ML Pen every morning before breakfast.    [provider]  lisinopril (ZESTRIL) 10 MG tablet Take 10 mg by mouth daily.    [provider]  pantoprazole (PROTONIX) 20 MG tablet Take 20 mg by mouth daily.    [provider]  Pioglitazone HCl (ACTOS PO) Take by mouth.    [provider]  TRULICITY 1.5 MG/0.5ML SOPN INJECT 1 PEN WEEKLY AS DIRECTED 07/01/18   [provider]    Allergies Atorvastatin, Remifentanil, Fentanyl, and Sulfa antibiotics  Family History  Problem Relation Age of Onset   Heart  disease Mother    Diabetes Father    Heart attack Father     Social History Social History   Tobacco Use   Smoking status: Never   Smokeless tobacco: Never  Vaping Use   Vaping Use: Never used  Substance Use Topics   Alcohol use: Not Currently   Drug use: Not Currently    Review of Systems  Constitutional: Negative for fever. Eyes: Negative for visual changes. ENT:  Negative for sore throat. Neck: No neck pain  Cardiovascular: Negative for chest pain. + orthopnea Respiratory: +shortness of breath. Gastrointestinal: Negative for abdominal pain, vomiting or diarrhea. Genitourinary: Negative for dysuria. Musculoskeletal: Negative for back pain. Skin: Negative for rash. Neurological: Negative for headaches, weakness or numbness. Psych: No SI or HI  ____________________________________________   PHYSICAL EXAM:  VITAL SIGNS: ED Triage Vitals  Enc Vitals Group     BP 05/15/21 0617 (!) 154/112     Pulse Rate 05/15/21 0617 74     Resp 05/15/21 0617 (!) 36     Temp 05/15/21 0620 (!) 92 F (33.3 C)     Temp Source 05/15/21 0620 Axillary     SpO2 05/15/21 0617 90 %     Weight 05/15/21 0617 109 lb (49.4 kg)     Height 05/15/21 0617 5' (1.524 m)     Head Circumference --      Peak Flow --      Pain Score --      Pain Loc --      Pain Edu? --      Excl. in GC? --     Constitutional: Alert and oriented, moderate respiratory distress.  HEENT:      Head: Normocephalic and atraumatic.         Eyes: Conjunctivae are normal. Sclera is non-icteric.       Mouth/Throat: Mucous membranes are moist.       Neck: Supple with no signs of meningismus. Cardiovascular: Regular rate and rhythm. No murmurs, gallops, or rubs. 2+ symmetrical distal pulses are present in all extremities. No JVD. Respiratory: Increased work of breathing, retractions, tachypneic, hypoxic to the low 80s on room air which improved to 91% on 15 L nonrebreather, crackles bilaterally with decreased breath sounds on bilateral bases Gastrointestinal: Soft, non tender, and non distended. Musculoskeletal:  No edema, cyanosis, or erythema of extremities. Neurologic: Normal speech and language. Face is symmetric. Moving all extremities. No gross focal neurologic deficits are appreciated. Skin: Skin is warm, dry and intact. No rash noted. Psychiatric: Mood and affect are normal. Speech and  behavior are normal.  ____________________________________________   LABS (all labs ordered are listed, but only abnormal results are displayed)  Labs Reviewed  CBC - Abnormal; Notable for the following components:      Result Value   WBC 15.4 (*)    Platelets 408 (*)    All other components within normal limits  COMPREHENSIVE METABOLIC PANEL - Abnormal; Notable for the following components:   Sodium 132 (*)    Chloride 96 (*)    CO2 20 (*)    Glucose, Bld 378 (*)    BUN 42 (*)    Creatinine, Ser 1.70 (*)    Calcium 8.5 (*)    Albumin 3.3 (*)    GFR, Estimated 32 (*)    Anion gap 16 (*)    All other components within normal limits  CBG MONITORING, ED - Abnormal; Notable for the following components:   Glucose-Capillary 342 (*)  All other components within normal limits  TROPONIN I (HIGH SENSITIVITY) - Abnormal; Notable for the following components:   Troponin I (High Sensitivity) 88 (*)    All other components within normal limits  RESP PANEL BY RT-PCR (FLU A&B, COVID) ARPGX2  BRAIN NATRIURETIC PEPTIDE  PROCALCITONIN   ____________________________________________  EKG  ED ECG REPORT I, Nita Sickle, the attending physician, personally viewed and interpreted this ECG.  Sinus rhythm with a rate of 72, low voltage QRS, normal QTC, normal axis, no ST elevations or depressions. ____________________________________________  RADIOLOGY  I have personally reviewed the images performed during this visit and I agree with the Radiologist's read.   Interpretation by Radiologist:  DG Chest 1 View  Result Date: 05/15/2021 CLINICAL DATA:  70 year old female with shortness of breath and respiratory distress. EXAM: CHEST  1 VIEW COMPARISON:  Chest radiographs 02/15/2006. FINDINGS: Portable AP upright view at 0634 hours. Similar lung volumes, and visible mediastinal contours appear normal. But there is diffuse increased pulmonary interstitial opacity, patchy bilateral  perihilar opacity and confluent bibasilar opacity which most resembles pleural effusions. No pneumothorax or air bronchograms. Visualized tracheal air column is within normal limits. Paucity of bowel gas in the upper abdomen. No acute osseous abnormality identified. IMPRESSION: Abnormal lungs most suggestive of acute pulmonary edema with bilateral pleural effusions and atelectasis. Bilateral infection with pleural effusions felt less likely. Electronically Signed   By: Odessa Fleming M.D.   On: 05/15/2021 07:06     ____________________________________________   PROCEDURES  Procedure(s) performed:yes .1-3 Lead EKG Interpretation  Date/Time: 05/15/2021 7:21 AM Performed by: Nita Sickle, MD Authorized by: Nita Sickle, MD     Interpretation: abnormal     ECG rate assessment: normal     Rhythm: sinus rhythm     Ectopy: none     Conduction: normal    Critical Care performed: yes  CRITICAL CARE5 min Performed by: Nita Sickle  ?  Total critical care time: 35 min  Critical care time was exclusive of separately billable procedures and treating other patients.  Critical care was necessary to treat or prevent imminent or life-threatening deterioration.  Critical care was time spent personally by me on the following activities: development of treatment plan with patient and/or surrogate as well as nursing, discussions with consultants, evaluation of patient's response to treatment, examination of patient, obtaining history from patient or surrogate, ordering and performing treatments and interventions, ordering and review of laboratory studies, ordering and review of radiographic studies, pulse oximetry and re-evaluation of patient's condition.  ____________________________________________   INITIAL IMPRESSION / ASSESSMENT AND PLAN / ED COURSE  70 y.o. female with history of STEMI 2 weeks ago with severe CAD status post PCI to the mid circumflex with a drug-eluting stent on  Plavix, paroxysmal atrial fibrillation on Eliquis, diabetes, former smoker who presents for evaluation of shortness of breath and orthopnea.  Patient arrives in moderate respiratory distress, hypoxic to the low 80s which only improved to 91% on a 15 L nonrebreather, patient looks gray, diaphoretic, actively vomiting.  She has no pitting edema but crackles bilaterally with decrease air movement on the basis bilaterally.  Chest x-ray visualized by me consistent with a CHF exacerbation, confirmed by radiology.  Patient was placed on BiPAP.  EKG with no signs of ischemia.  Elevated white count of 15.4 and elevated platelets of 408.  First troponin is elevated at 88, last trop at [facility] was 119K on 5/27.  Chemistry panel showing creatinine of 1.7 which is improved from  prior from 3 days ago which was 2.03.  Blood glucose of 378 with an anion gap of 16.  Procalcitonin and BNP are pending.  Will give IV Lasix, start patient on IV heparin and get her admitted to the hospitalist service.      _____________________________________________ Please note:  Patient was evaluated in Emergency Department today for the symptoms described in the history of present illness. Patient was evaluated in the context of the global COVID-19 pandemic, which necessitated consideration that the patient might be at risk for infection with the SARS-CoV-2 virus that causes COVID-19. Institutional protocols and algorithms that pertain to the evaluation of patients at risk for COVID-19 are in a state of rapid change based on information released by regulatory bodies including the CDC and federal and state organizations. These policies and algorithms were followed during the patient's care in the ED.  Some ED evaluations and interventions may be delayed as a result of limited staffing during the pandemic.   [place] Controlled Substance Database was reviewed by me. ____________________________________________   FINAL CLINICAL IMPRESSION(S) / ED  DIAGNOSES   Final diagnoses:  Acute respiratory failure with hypoxia (HCC)  Acute congestive heart failure, unspecified heart failure type (HCC)      NEW MEDICATIONS STARTED DURING THIS VISIT:  ED Discharge Orders     None        Note:  This document was prepared using Dragon voice recognition software and may include unintentional dictation errors.    Don PerkingVeronese, [place], MD 05/15/21 [phone]

## 2021-05-15 NOTE — ED Notes (Signed)
Called lab to come draw second set of blood cultures

## 2021-05-15 NOTE — H&P (Signed)
History and Physical    [name]:[id number] DOB: 07/09/1951 DOA: 05/15/2021  Referring MD/NP/PA:   PCP: Estell Harpin, MD   Patient coming from:  The patient is coming from home.  At baseline, pt is independent for most of ADL.        Chief Complaint: SOB  HPI: Kylie Thomas is a 70 y.o. female with medical history significant of hypertension, hyperlipidemia, diabetes mellitus, GERD, depression with anxiety, cataract, atrial fibrillation on Eliquis, former smoker, CKD stage III, CAD, recent STEMI s/p PCI w/ DESx1, sCHF with EF of 45%, who presents with shortness of breath.  Patient was recently hospitalized to [facility] due to STEMI and s/p PCI w/ DES two weeks ago. She has new CHF and A fib. She is discharged on lasix and Eliquis. She is taking lasix 60 mg bid. Last week she was told by her doctor that she looked dehydrated and therefore her Lasix was cut in half. She states that she developed shortness of breath in the past 2 days, which has been progressively worsening, particularly on exertion.  Patient denies chest pain, no fever or chills.  She has dry cough.  Patient has nausea, no vomiting or abdominal pain.  She states she has chronic mild intermittent diarrhea due to IBS, which has improved recently.  No symptoms of UTI. Patient has severe respiratory distress, BiPAP is started in ED.  ED Course: pt was found to have BNP 1372, troponin level 88, 73, negative COVID PCR, positive urinalysis for UTI (hazy appearance, moderate amount of leukocyte, many bacteria, WBC 11-20), worsening renal function, temperature 92, blood pressure 144/106, heart rate 55, RR 36.  Chest x-ray showed pulmonary edema.  Patient is admitted to progressive bed as inpatient.  Review of Systems:   General: no fevers, chills, no body weight gain, has fatigue HEENT: no blurry vision, hearing changes or sore throat Respiratory: has dyspnea, coughing, no wheezing CV: no chest pain, no palpitations GI: has  nausea,no  vomiting, abdominal pain, has diarrhea, no constipation GU: no dysuria, burning on urination, increased urinary frequency, hematuria  Ext: has trace leg edema Neuro: no unilateral weakness, numbness, or tingling, no vision change or hearing loss Skin: no rash, no skin tear. MSK: No muscle spasm, no deformity, no limitation of range of movement in spin Heme: No easy bruising.  Travel history: No recent long distant travel.  Allergy:  Allergies  Allergen Reactions   Atorvastatin Other (See Comments)    Other reaction(s): Arthralgia (Joint Pain) Severe cramps   Remifentanil     Other reaction(s): Irregular Heart Rate   Fentanyl    Sulfa Antibiotics Nausea Only and Other (See Comments)    No past medical history on file.  Past Surgical History:  Procedure Laterality Date   BELPHAROPTOSIS REPAIR     CATARACT EXTRACTION, BILATERAL     CESAREAN SECTION     TRIGGER FINGER RELEASE     x 7    Social History:  reports that she has never smoked. She has never used smokeless tobacco. She reports previous alcohol use. She reports previous drug use.  Family History:  Family History  Problem Relation Age of Onset   Heart disease Mother    Diabetes Father    Heart attack Father      Prior to Admission medications   Medication Sig Start Date End Date Taking? Authorizing Provider  aspirin 325 MG tablet Take 325 mg by mouth daily.    [provider]  buPROPion (WELLBUTRIN XL) 150 MG 24 hr tablet Take 150 mg by mouth 2 (two) times daily. 06/03/18   [provider]  dorzolamide-timolol (COSOPT) 22.3-6.8 MG/ML ophthalmic solution INSTILL 1 DROP INTO AFFECTED EYE(S) TWICE DAILY 07/08/18   [provider]  DULoxetine (CYMBALTA) 60 MG capsule Take 60 mg by mouth daily. 06/03/18   [provider]  gabapentin (NEURONTIN) 300 MG capsule Take by mouth.    [provider]  glucose blood (ONE TOUCH ULTRA TEST) test strip OneTouch Ultra Blue Test  Strip    [provider]  Insulin Detemir (LEVEMIR FLEXTOUCH) 100 UNIT/ML Pen every morning before breakfast.    [provider]  lisinopril (ZESTRIL) 10 MG tablet Take 10 mg by mouth daily.    [provider]  pantoprazole (PROTONIX) 20 MG tablet Take 20 mg by mouth daily.    [provider]  Pioglitazone HCl (ACTOS PO) Take by mouth.    [provider]  TRULICITY 1.5 MG/0.5ML SOPN INJECT 1 PEN WEEKLY AS DIRECTED 07/01/18   [provider]    Physical Exam: Vitals:   05/15/21 0720 05/15/21 1230 05/15/21 1315 05/15/21 1330  BP: (!) 144/106 113/63  120/64  Pulse: (!) 55 (!) 58 64 (!) 57  Resp: 10 14 15 16   Temp:      TempSrc:      SpO2: 100% 96% 95% 91%  Weight:      Height:       General: Not in acute distress HEENT:       Eyes: PERRL, EOMI, no scleral icterus.       ENT: No discharge from the ears and nose, no pharynx injection, no tonsillar enlargement.        Neck: positive JVD, no bruit, no mass felt. Heme: No neck lymph node enlargement. Cardiac: S1/S2, RRR, No murmurs, No gallops or rubs. Respiratory: has fine crackles bilaterally GI: Soft, nondistended, nontender, no rebound pain, no organomegaly, BS present. GU: No hematuria Ext: has trace leg edema bilaterally. 1+DP/PT pulse bilaterally. Musculoskeletal: No joint deformities, No joint redness or warmth, no limitation of ROM in spin. Skin: No rashes.  Neuro: Alert, oriented X3, cranial nerves II-XII grossly intact, moves all extremities normally. Psych: Patient is not psychotic, no suicidal or hemocidal ideation.  Labs on Admission: I have personally reviewed following labs and imaging studies  CBC: Recent Labs  Lab 05/15/21 0617  WBC 15.4*  HGB 12.9  HCT 39.3  MCV 91.6  PLT 408*   Basic Metabolic Panel: Recent Labs  Lab 05/15/21 0617  NA 132*  K 3.8  CL 96*  CO2 20*  GLUCOSE 378*  BUN 42*  CREATININE 1.70*  CALCIUM 8.5*   GFR: Estimated  Creatinine Clearance: 22.1 mL/min (A) (by C-G formula based on SCr of 1.7 mg/dL (H)). Liver Function Tests: Recent Labs  Lab 05/15/21 0617  AST 22  ALT 16  ALKPHOS 104  BILITOT 0.5  PROT 7.2  ALBUMIN 3.3*   No results for input(s): LIPASE, AMYLASE in the last 168 hours. No results for input(s): AMMONIA in the last 168 hours. Coagulation Profile: Recent Labs  Lab 05/15/21 0628  INR 1.3*   Cardiac Enzymes: No results for input(s): CKTOTAL, CKMB, CKMBINDEX, TROPONINI in the last 168 hours. BNP (last 3 results) No results for input(s): PROBNP in the last 8760 hours. HbA1C: No results for input(s): HGBA1C in the last 72 hours. CBG: Recent Labs  Lab 05/15/21 0617 05/15/21 1257  GLUCAP 342* 320*  Lipid Profile: No results for input(s): CHOL, HDL, LDLCALC, TRIG, CHOLHDL, LDLDIRECT in the last 72 hours. Thyroid Function Tests: No results for input(s): TSH, T4TOTAL, FREET4, T3FREE, THYROIDAB in the last 72 hours. Anemia Panel: No results for input(s): VITAMINB12, FOLATE, FERRITIN, TIBC, IRON, RETICCTPCT in the last 72 hours. Urine analysis:    Component Value Date/Time   COLORURINE YELLOW (A) 05/15/2021 0942   APPEARANCEUR HAZY (A) 05/15/2021 0942   LABSPEC 1.009 05/15/2021 0942   PHURINE 5.0 05/15/2021 0942   GLUCOSEU >=500 (A) 05/15/2021 0942   HGBUR NEGATIVE 05/15/2021 0942   BILIRUBINUR NEGATIVE 05/15/2021 0942   KETONESUR NEGATIVE 05/15/2021 0942   PROTEINUR NEGATIVE 05/15/2021 0942   NITRITE NEGATIVE 05/15/2021 0942   LEUKOCYTESUR MODERATE (A) 05/15/2021 0942   Sepsis Labs: @LABRCNTIP (procalcitonin:4,lacticidven:4) ) Recent Results (from the past 240 hour(s))  Resp Panel by RT-PCR (Flu A&B, Covid) Nasopharyngeal Swab     Status: None   Collection Time: 05/15/21  7:56 AM   Specimen: Nasopharyngeal Swab; Nasopharyngeal(NP) swabs in vial transport medium  Result Value Ref Range Status   SARS Coronavirus 2 by RT PCR NEGATIVE NEGATIVE Final    Comment:  (NOTE) SARS-CoV-2 target nucleic acids are NOT DETECTED.  The SARS-CoV-2 RNA is generally detectable in upper respiratory specimens during the acute phase of infection. The lowest concentration of SARS-CoV-2 viral copies this assay can detect is 138 copies/mL. A negative result does not preclude SARS-Cov-2 infection and should not be used as the sole basis for treatment or other patient management decisions. A negative result may occur with  improper specimen collection/handling, submission of specimen other than nasopharyngeal swab, presence of viral mutation(s) within the areas targeted by this assay, and inadequate number of viral copies(<138 copies/mL). A negative result must be combined with clinical observations, patient history, and epidemiological information. The expected result is Negative.  Fact Sheet for Patients:  05/17/21  Fact Sheet for Healthcare Providers:  BloggerCourse.com  This test is no t yet approved or cleared by the [employer] FDA and  has been authorized for detection and/or diagnosis of SARS-CoV-2 by FDA under an Emergency Use Authorization (EUA). This EUA will remain  in effect (meaning this test can be used) for the duration of the COVID-19 declaration under Section 564(b)(1) of the Act, 21 U.S.C.section 360bbb-3(b)(1), unless the authorization is terminated  or revoked sooner.       Influenza A by PCR NEGATIVE NEGATIVE Final   Influenza B by PCR NEGATIVE NEGATIVE Final    Comment: (NOTE) The Xpert Xpress SARS-CoV-2/FLU/RSV plus assay is intended as an aid in the diagnosis of influenza from Nasopharyngeal swab specimens and should not be used as a sole basis for treatment. Nasal washings and aspirates are unacceptable for Xpert Xpress SARS-CoV-2/FLU/RSV testing.  Fact Sheet for Patients: [place]  Fact Sheet for Healthcare  Providers: BloggerCourse.com  This test is not yet approved or cleared by the [employer].[place] FDA and has been authorized for detection and/or diagnosis of SARS-CoV-2 by FDA under an Emergency Use Authorization (EUA). This EUA will remain in effect (meaning this test can be used) for the duration of the COVID-19 declaration under Section 564(b)(1) of the Act, 21 U.S.C. section 360bbb-3(b)(1), unless the authorization is terminated or revoked.  Performed at [facility], [address] [place], [place]      Radiological Exams on Admission: DG Chest 1 View  Result Date: 05/15/2021 CLINICAL DATA:  70 year old female with shortness of breath and respiratory distress. EXAM: CHEST  1  VIEW COMPARISON:  Chest radiographs 02/15/2006. FINDINGS: Portable AP upright view at 0634 hours. Similar lung volumes, and visible mediastinal contours appear normal. But there is diffuse increased pulmonary interstitial opacity, patchy bilateral perihilar opacity and confluent bibasilar opacity which most resembles pleural effusions. No pneumothorax or air bronchograms. Visualized tracheal air column is within normal limits. Paucity of bowel gas in the upper abdomen. No acute osseous abnormality identified. IMPRESSION: Abnormal lungs most suggestive of acute pulmonary edema with bilateral pleural effusions and atelectasis. Bilateral infection with pleural effusions felt less likely. Electronically Signed   By: Odessa Fleming M.D.   On: 05/15/2021 07:06     EKG: I have personally reviewed.  Seems to be sinus rhythm, QTC 466, low voltage, poor R wave progression.   Assessment/Plan Principal Problem:   Acute on chronic systolic CHF (congestive heart failure) (HCC) Active Problems:   Acute respiratory failure with hypoxia (HCC)   HTN (hypertension)   HLD (hyperlipidemia)   Type II diabetes mellitus with renal manifestations (HCC)   Depression with anxiety   CKD (chronic  kidney disease), stage IIIa   CAD (coronary artery disease)   Atrial fibrillation, chronic (HCC)   Hypothermia   Sepsis (HCC)   Elevated troponin   UTI (urinary tract infection)   Acute respiratory failure with hypoxia due to acute on chronic systolic CHF (congestive heart failure) [facility]): Patient has trace leg edema, but has positive JVD, elevated BNP 1372, pulmonary edema chest x-ray, clinically consistent with CHF exacerbation.  2D echo on 04/27/2021 showed EF of 45%.  -Will admit to progressive unit as inpatient -Lasix 40 mg bid by IV ( pt received 20 mg Lasix earlier, will give another 20 mg today) -2d echo -Daily weights -strict I/O's -Low salt diet -Fluid restriction -Obtain REDs Vest reading  Elevated troponin and hx of CAD: s/p of DES recently. Trop 88 -->73. No chest pain.  Likely due to demand ischemia. -Continue Crestor, Plavix, metoprolol -Trend troponin - Check FLP - Patient had A1c 11.8 on 04/22/2021 - pt is not taking ASA  Atrial fibrillation, chronic: Heart rate 55 -Continue Eliquis and amiodarone -Decrease metoprolol dose from 50 to 25 mg twice daily due to bradycardia, with holding parameter for heart rate less than 60  HTN (hypertension) -IV hydralazine as needed -Patient is on IV Lasix -Hold Cozaar and lisinopril due to worsening renal function -Metoprolol  HLD (hyperlipidemia) -Crestor  Type II diabetes mellitus with renal manifestations [facility]): Recent A1c 11.8, poorly controlled.  Patient taking NovoLog and Levemir.  Blood sugar 378, bicarbonate 20, anion gap 16, slightly elevated.  Urinalysis negative for ketones.  Will not treat as DKA. -Continue home dose of Levemir 10 unit daily -Sliding scale insulin  Depression with anxiety -Continue home medications  CKD (chronic kidney disease), stage IIIa: Recent creatinine 0.88 on 6/2, 1.45 on 6/8, 2.03 on 6/16, today patient's creatinine is 1.70, BUN 42, still worse than baseline.  Likely due to UTI -Hold  lisinopril mucosa -Follow-up renal function by BMP  Hypothermia -Bair hugger  Sepsis due to UTI (urinary tract infection): Patient meets criteria for sepsis with leukocytosis with WBC 15.4, tachypnea with RR 36 and hypothermia with temperature 92. -IV Rocephin -Follow-up blood culture urine culture -Will not give IV fluids due to CHF exacerbation -Check procalcitonin and lactic acid level     DVT ppx: on eliquis Code Status: Full code Family Communication:   Yes, patient's  son by phone Disposition Plan:  Anticipate discharge back to previous environment Consults called:  none Admission status and Level of care: Progressive Cardiac:    as inpt        Status is: Inpatient  Remains inpatient appropriate because:Inpatient level of care appropriate due to severity of illness  Dispo: The patient is from: Home              Anticipated d/c is to: Home              Patient currently is not medically stable to d/c.   Difficult to place patient No          Date of Service 05/15/2021    Lorretta Harp [facility]   If 7PM-7AM, please contact night-coverage [email or web address] 05/15/2021, 2:27 PM

## 2021-05-15 NOTE — ED Triage Notes (Signed)
Pt arrives in resp distress. Pale, vomiting, diaphoretic. Pt placed on nrb at 15lpm for pox on 6lpm of 80%. Pt to have bipap in triage while waiting on room. Pt states she also believes her blood sugar is low.

## 2021-05-15 NOTE — ED Notes (Signed)
RT removed Bipap from pt and placed pt on 2L oxygen [place]. Pt remains 93% on 2L.

## 2021-05-16 LAB — CBC
HCT: 33.4 % — ABNORMAL LOW (ref 36.0–46.0)
Hemoglobin: 11.1 g/dL — ABNORMAL LOW (ref 12.0–15.0)
MCH: 29.4 pg (ref 26.0–34.0)
MCHC: 33.2 g/dL (ref 30.0–36.0)
MCV: 88.4 fL (ref 80.0–100.0)
Platelets: 311 10*3/uL (ref 150–400)
RBC: 3.78 MIL/uL — ABNORMAL LOW (ref 3.87–5.11)
RDW: 12.9 % (ref 11.5–15.5)
WBC: 8.9 10*3/uL (ref 4.0–10.5)
nRBC: 0 % (ref 0.0–0.2)

## 2021-05-16 LAB — BASIC METABOLIC PANEL
Anion gap: 6 (ref 5–15)
BUN: 35 mg/dL — ABNORMAL HIGH (ref 8–23)
CO2: 33 mmol/L — ABNORMAL HIGH (ref 22–32)
Calcium: 8.5 mg/dL — ABNORMAL LOW (ref 8.9–10.3)
Chloride: 99 mmol/L (ref 98–111)
Creatinine, Ser: 1.63 mg/dL — ABNORMAL HIGH (ref 0.44–1.00)
GFR, Estimated: 34 mL/min — ABNORMAL LOW (ref >60)
Glucose, Bld: 135 mg/dL — ABNORMAL HIGH (ref 70–99)
Potassium: 3.5 mmol/L (ref 3.5–5.1)
Sodium: 138 mmol/L (ref 135–145)

## 2021-05-16 LAB — GLUCOSE, CAPILLARY
Glucose-Capillary: 141 mg/dL — ABNORMAL HIGH (ref 70–99)
Glucose-Capillary: 125 mg/dL — ABNORMAL HIGH (ref 70–99)
Glucose-Capillary: 284 mg/dL — ABNORMAL HIGH (ref 70–99)
Glucose-Capillary: 374 mg/dL — ABNORMAL HIGH (ref 70–99)

## 2021-05-16 LAB — MAGNESIUM: Magnesium: 2 mg/dL (ref 1.7–2.4)

## 2021-05-16 LAB — TROPONIN I (HIGH SENSITIVITY): Troponin I (High Sensitivity): 78 ng/L — ABNORMAL HIGH (ref <18)

## 2021-05-16 LAB — LIPID PANEL
Cholesterol: 95 mg/dL (ref 0–200)
HDL: 40 mg/dL — ABNORMAL LOW (ref >40)
LDL Cholesterol: 45 mg/dL (ref 0–99)
Total CHOL/HDL Ratio: 2.4 RATIO
Triglycerides: 52 mg/dL (ref <150)
VLDL: 10 mg/dL (ref 0–40)

## 2021-05-16 MED ORDER — FUROSEMIDE 10 MG/ML IJ SOLN: 40.0000 mg | Freq: Every day | INTRAMUSCULAR | Status: DC

## 2021-05-16 MED ORDER — APIXABAN 2.5 MG PO TABS: 2.5000 mg | ORAL_TABLET | Freq: Two times a day (BID) | ORAL | Status: DC

## 2021-05-16 NOTE — Progress Notes (Signed)
[facility] Progress Note  Patient: Kylie Thomas    YQI:[id number]  DOA: 05/15/2021     Date of Service: the patient was seen and examined on 05/16/2021  Brief hospital course: Past medical history of HTN, HLD, type II DM, GERD, depression chronic A. fib, CKD 3B, CAD with recent STEMI SP PCI to circumflex, HFrEF with EF 45%. Presents with complaints of acute worsening of shortness of breath and hypoxia likely secondary to flash pulmonary edema. Currently plan is IV Lasix and follow cardiology recommendation.  Assessment and Plan: 1.  Acute hypoxic respiratory failure, POA Acute on chronic systolic CHF/pulmonary edema Presenting complaints of shortness of breath.  Currently needing 3 L of oxygen. Positive JVD on exam.  Chest x-ray shows vascular congestion. BNP elevated. Started on IV Lasix 40 mg twice daily. EKG shows no acute ischemia and troponins are also not consistent with ACS territory. Continue with IV Lasix and monitor response. Currently consulted for further assistance.  2.  CAD with recent STEMI Elevated troponin Posterior STEMI on May 2022.  Cath showed circumflex and LAD disease.  Had stent placed on circumflex. Loaded with Brilinta back then but currently on Plavix and therapeutic anticoagulation with Eliquis. Continue Crestor continue metoprolol. Troponins not consistent with ACS although not sure whether LAD stenosis is playing a role in patient's presentation with flash pulmonary edema. Cardiology consult for further assistance.  3.  Chronic A. fib Started to have this since her recent hospitalization for STEMI. Continue metoprolol continue Eliquis continue amiodarone.  4.  HTN Blood pressure soft. Will hold medication.  5.  HLD Continue Crestor  6.  Type II DM, uncontrolled with hyperglycemia with long-term insulin use with renal complication Renal function stable. Holding home regimen. Continue current sliding scale and Levemir. Monitor.  7.   CKD 3A Baseline serum creatinine around 1.41.5.  Currently stable.  Monitor.  8.  Depression with anxiety. Continue home medication. No suicidal ideation.  9.  Concern for sepsis or UTI.  Ruled out. Patient had met SIRS criteria with leucocytosis and tachypnea but likely due to presentation with acute CHF.  Leucocytosis is resolved, tachypnea improving, no fever or chills.  Stop Antibiotics.   Diet: cardiac diet DVT Prophylaxis: Therapeutic Anticoagulation with apixaban       Advance goals of care discussion: Full code  Family Communication: no family was present at bedside, at the time of interview.   Disposition:  Status is: Inpatient  Remains inpatient appropriate because:Inpatient level of care appropriate due to severity of illness  Dispo: The patient is from: Home              Anticipated d/c is to: Home              Patient currently is not medically stable to d/c.   Difficult to place patient No        Subjective: continue to to have shortness of breath, no chest pain has some chest tightness. No nausea  Physical Exam:  General: Appear in mild distress, no Rash; Oral Mucosa Clear, moist. no Abnormal Neck Mass Or lumps, Conjunctiva normal  Cardiovascular: S1 and S2 Present, no Murmur, Respiratory: increased respiratory effort, Bilateral Air entry present and bilateral  Crackles, no wheezes Abdomen: Bowel Sound present, Soft and no tenderness Extremities: trace Pedal edema Neurology: alert and oriented to time, place, and person affect appropriate. no new focal deficit Gait not checked due to patient safety concerns  Vitals:   05/16/21 0540 05/16/21 0740 05/16/21 1131  05/16/21 1639  BP: (!) 108/57 (!) 107/52 (!) 98/52 (!) 96/49  Pulse: (!) 56 (!) 58 (!) 58 61  Resp: '19 18 18 20  ' Temp: 98.7 F (37.1 C) 98.5 F (36.9 C) 97.9 F (36.6 C) 98 F (36.7 C)  TempSrc: Oral     SpO2: 95% 93% 92% 93%  Weight:      Height:        Intake/Output Summary (Last  24 hours) at 05/16/2021 1756 Last data filed at 05/16/2021 1050 Gross per 24 hour  Intake 100 ml  Output 900 ml  Net -800 ml   Filed Weights   05/15/21 0617  Weight: 49.4 kg    Data Reviewed: I have personally reviewed and interpreted daily labs, tele strips, imaging. I reviewed all nursing notes, pharmacy notes, vitals, pertinent old records I have discussed plan of care as described above with RN and patient/family.  CBC: Recent Labs  Lab 05/15/21 0617 05/16/21 0540  WBC 15.4* 8.9  HGB 12.9 11.1*  HCT 39.3 33.4*  MCV 91.6 88.4  PLT 408* 024   Basic Metabolic Panel: Recent Labs  Lab 05/15/21 0617 05/16/21 0540  NA 132* 138  K 3.8 3.5  CL 96* 99  CO2 20* 33*  GLUCOSE 378* 135*  BUN 42* 35*  CREATININE 1.70* 1.63*  CALCIUM 8.5* 8.5*  MG  --  2.0    Studies: No results found.  Scheduled Meds:  amiodarone  200 mg Oral Daily   clopidogrel  75 mg Oral Daily   dorzolamide-timolol  1 drop Both Eyes BID   DULoxetine  60 mg Oral Daily   [START ON 05/17/2021] furosemide  40 mg Intravenous Daily   insulin aspart  0-5 Units Subcutaneous QHS   insulin aspart  0-9 Units Subcutaneous TID WC   insulin glargine  10 Units Subcutaneous Daily   mirtazapine  7.5 mg Oral QHS   pantoprazole  20 mg Oral Daily   rosuvastatin  40 mg Oral q morning   Continuous Infusions: PRN Meds: acetaminophen, albuterol, gabapentin, hydrALAZINE, loratadine, nitroGLYCERIN, ondansetron (ZOFRAN) IV  Time spent: 35 minutes  Author: Berle Mull, MD [facility] 05/16/2021 5:56 PM  To reach On-call, see care teams to locate the attending and reach out via [email or web address]. Between 7PM-7AM, please contact night-coverage If you still have difficulty reaching the attending provider, please page the [facility] (Director on Call) for [facility] on amion for assistance.

## 2021-05-16 NOTE — Plan of Care (Signed)
  Problem: Nutrition: Goal: Adequate nutrition will be maintained Outcome: Progressing   Problem: Coping: Goal: Level of anxiety will decrease Outcome: Progressing   

## 2021-05-16 NOTE — Evaluation (Signed)
Physical Therapy Evaluation Patient Details Name: Kylie Thomas MRN: [id number] DOB: September 30, 1951 Today's Date: 05/16/2021   History of Present Illness  Pt admitted for acute/chronic CHF with recent hospital stay at [facility] due to STEMI and is s/p PCI with stent. History includes HTN, HLD, DM, GERD, Depression, and AFib. Primary complaint is nausea. Per patient, possibly going for stent placement tomorrow.  Clinical Impression  Pt is a pleasant 70 year old female who was admitted for acute/chronic CHF. Pt performs bed mobility with indep, transfers with mod I, and ambulation with cga and RW. Pt then able to perform further ambulation without AD with slight unsteadiness. Educated on continued use of AD due to acute illness. Pt demonstrates all bed mobility/transfers/ambulation at baseline level. More appropriate for cardiac rehab at this time to address needs. Pt does not require any further PT needs at this time. Pt will be dc in house and does not require follow up. RN aware. Will dc current orders.  SaO2 on room air at rest = 86% SaO2 on 3L at rest = 90% SaO2 on 3 liters of O2 while ambulating = 85%; cues for PLB with improvement to 95% upon resting     Follow Up Recommendations No PT follow up (recommend cardiac rehab-wants to go to [facility])    Equipment Recommendations  None recommended by PT    Recommendations for Other Services       Precautions / Restrictions Precautions Precautions: None Restrictions Weight Bearing Restrictions: No      Mobility  Bed Mobility Overal bed mobility: Independent             General bed mobility comments: safe technique    Transfers Overall transfer level: Modified independent Equipment used: Rolling walker (2 wheeled)             General transfer comment: slightly impulsive, however generally stable throughout.  Ambulation/Gait Ambulation/Gait assistance: Min guard Gait Distance (Feet): 200 Feet Assistive device: Rolling walker (2  wheeled) Gait Pattern/deviations: Step-through pattern     General Gait Details: ambulated around RN station with step through gait pattern. Does fatigue with increased ambulation with O2 sats decreasing to 85% on 3L of O2. Able to perform last 20' without AD with slight unsteadiness, however no overt LOB.  Stairs            Wheelchair Mobility    Modified Rankin (Stroke Patients Only)       Balance Overall balance assessment: History of Falls;Needs assistance Sitting-balance support: Feet supported Sitting balance-Leahy Scale: Normal     Standing balance support: No upper extremity supported Standing balance-Leahy Scale: Fair                               Pertinent Vitals/Pain Pain Assessment: No/denies pain    Home Living Family/patient expects to be discharged to:: Private residence Living Arrangements: Children (recovering at daughter's house since dc) Available Help at Discharge: Family;Available 24 hours/day (daughter) Type of Home: House Home Access: Stairs to enter Entrance Stairs-Rails: Can reach both Entrance Stairs-Number of Steps: 4 Home Layout: One level Home Equipment:  (3 wheeled RW, standing RW) Additional Comments: planning to return to daughter's home for recovery    Prior Function Level of Independence: Independent with assistive device(s)         Comments: + 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 Was using rollator prior to Helen.     Hand Dominance  Extremity/Trunk Assessment   Upper Extremity Assessment Upper Extremity Assessment: Overall WFL for tasks assessed    Lower Extremity Assessment Lower Extremity Assessment: Overall WFL for tasks assessed       Communication   Communication: No difficulties  Cognition Arousal/Alertness: Awake/alert Behavior During Therapy: WFL for tasks assessed/performed Overall Cognitive Status: Within Functional Limits for tasks assessed                                  General Comments: slightly manic behavior, impulsive      General Comments      Exercises     Assessment/Plan    PT Assessment All further PT needs can be met in the next venue of care  PT Problem List Decreased balance;Decreased mobility;Cardiopulmonary status limiting activity       PT Treatment Interventions      PT Goals (Current goals can be found in the Care Plan section)  Acute Rehab PT Goals Patient Stated Goal: to go home PT Goal Formulation: All assessment and education complete, DC therapy Time For Goal Achievement: 05/16/21 Potential to Achieve Goals: Good    Frequency     Barriers to discharge        Co-evaluation               AM-PAC PT "6 Clicks" Mobility  Outcome Measure Help needed turning from your back to your side while in a flat bed without using bedrails?: None Help needed moving from lying on your back to sitting on the side of a flat bed without using bedrails?: None Help needed moving to and from a bed to a chair (including a wheelchair)?: None Help needed standing up from a chair using your arms (e.g., wheelchair or bedside chair)?: None Help needed to walk in hospital room?: None Help needed climbing 3-5 steps with a railing? : A Little 6 Click Score: 23    End of Session Equipment Utilized During Treatment: Gait belt;Oxygen Activity Tolerance: Patient tolerated treatment well Patient left: in bed Nurse Communication: Mobility status PT Visit Diagnosis: Difficulty in walking, not elsewhere classified (R26.2)    Time: 1245-8099 PT Time Calculation (min) (ACUTE ONLY): 35 min   Charges:   PT Evaluation $PT Eval Low Complexity: 1 Low PT Treatments $Gait Training: 8-22 mins        Greggory Stallion, PT, DPT [phone]   [name] 05/16/2021, 4:12 PM

## 2021-05-16 NOTE — Consult Note (Signed)
[facility] Cardiology Consultation Note  Patient ID: Kylie Thomas, MRN: [id number], DOB/AGE: [birth year] 70 y.o. Admit date: 05/15/2021   Date of Consult: 05/16/2021 Primary Physician: Estell Harpin, MD Primary Cardiologist: [facility]  Chief Complaint:  Chief Complaint  Patient presents with   Respiratory Distress   Reason for Consult:  Heart failure  HPI: 70 y.o. female with known coronary disease status post PCI and stent placement of proximal left circumflex and occluded PDA and a 70% left anterior descending artery who has had multiple episodes of severe shortness of breath and chest discomfort since her primary PCI.  Patient does have diabetes hypertension hyperlipidemia and now a new onset flash pulmonary edema with an EKG showing normal sinus rhythm left atrial enlargement and poor R wave progression.  After intravenous Lasix the patient did feel somewhat better and her oxygenation improved.  BNP was 1372 glomerular filtration rate is 32 troponin is 80/73 without evidence of acute coronary syndrome.  She does have known paroxysmal nonvalvular atrial fibrillation on anticoagulation as well as amiodarone maintaining normal sinus rhythm at this time  No past medical history on file.    Surgical History:  Past Surgical History:  Procedure Laterality Date   BELPHAROPTOSIS REPAIR     CATARACT EXTRACTION, BILATERAL     CESAREAN SECTION     TRIGGER FINGER RELEASE     x 7     Home Meds: Prior to Admission medications   Medication Sig Start Date End Date Taking? Authorizing Provider  amiodarone (PACERONE) 200 MG tablet Take 200 mg by mouth daily. 04/28/21  Yes [provider]  clopidogrel (PLAVIX) 75 MG tablet Take 75 mg by mouth daily. 04/28/21  Yes [provider]  dorzolamide-timolol (COSOPT) 22.3-6.8 MG/ML ophthalmic solution Place 1 drop into both eyes 2 (two) times daily. 07/08/18  Yes [provider]  DULoxetine (CYMBALTA) 60 MG capsule Take 60 mg by mouth  daily. 06/03/18  Yes [provider]  ELIQUIS 5 MG TABS tablet Take 5 mg by mouth 2 (two) times daily. 04/28/21  Yes [provider]  furosemide (LASIX) 20 MG tablet Take 40 mg by mouth 2 (two) times daily. 05/05/21  Yes [provider]  gabapentin (NEURONTIN) 300 MG capsule Take 300 mg by mouth daily as needed.   Yes [provider]  insulin aspart (NOVOLOG) 100 UNIT/ML injection Inject 4 Units into the skin with breakfast, with lunch, and with evening meal. And sliding scale 04/28/21 05/28/21 Yes [provider]  insulin glargine (LANTUS) 100 UNIT/ML injection Inject 10 Units into the skin daily. 04/28/21 05/28/21 Yes [provider]  loratadine (CLARITIN) 10 MG tablet Take 10 mg by mouth daily as needed for allergies.   Yes [provider]  losartan (COZAAR) 25 MG tablet Take 12.5 mg by mouth every morning. 04/28/21  Yes [provider]  magnesium oxide (MAG-OX) 400 MG tablet Take 400 mg by mouth daily.   Yes [provider]  metoprolol succinate (TOPROL-XL) 50 MG 24 hr tablet Take 50 mg by mouth 2 (two) times daily. 04/28/21  Yes [provider]  mirtazapine (REMERON) 7.5 MG tablet Take 7.5 mg by mouth at bedtime. 04/28/21  Yes [provider]  nitroGLYCERIN (NITROSTAT) 0.4 MG SL tablet Place 0.4 mg under the tongue every 5 (five) minutes as needed. 04/28/21  Yes [provider]  pantoprazole (PROTONIX) 20 MG tablet Take 20 mg by mouth daily.   Yes [provider]  rosuvastatin (CRESTOR) 40 MG tablet  Take 40 mg by mouth every morning. 04/28/21  Yes [provider]  aspirin 325 MG tablet Take 325 mg by mouth daily. Patient not taking: Reported on 05/15/2021    [provider]  buPROPion (WELLBUTRIN XL) 150 MG 24 hr tablet Take 150 mg by mouth 2 (two) times daily. Patient not taking: Reported on 05/15/2021 06/03/18   [provider]  glucose blood (ONE TOUCH ULTRA TEST) test strip  OneTouch Ultra Blue Test Strip    [provider]  hyoscyamine (LEVBID) 0.375 MG 12 hr tablet Take 0.375 mg by mouth daily. Patient not taking: Reported on 05/15/2021 11/25/20   [provider]  insulin detemir (LEVEMIR) 100 UNIT/ML FlexPen every morning before breakfast. Patient not taking: Reported on 05/15/2021    [provider]  lisinopril (ZESTRIL) 10 MG tablet Take 10 mg by mouth daily. Patient not taking: Reported on 05/15/2021    [provider]  Pioglitazone HCl (ACTOS PO) Take by mouth. Patient not taking: Reported on 05/15/2021    [provider]  TRULICITY 1.5 MG/0.5ML SOPN INJECT 1 PEN WEEKLY AS DIRECTED Patient not taking: Reported on 05/15/2021 07/01/18   [provider]    Inpatient Medications:   amiodarone  200 mg Oral Daily   clopidogrel  75 mg Oral Daily   dorzolamide-timolol  1 drop Both Eyes BID   DULoxetine  60 mg Oral Daily   [START ON 05/17/2021] furosemide  40 mg Intravenous Daily   insulin aspart  0-5 Units Subcutaneous QHS   insulin aspart  0-9 Units Subcutaneous TID WC   insulin glargine  10 Units Subcutaneous Daily   mirtazapine  7.5 mg Oral QHS   pantoprazole  20 mg Oral Daily   rosuvastatin  40 mg Oral q morning     Allergies:  Allergies  Allergen Reactions   Atorvastatin Other (See Comments)    Other reaction(s): Arthralgia (Joint Pain) Severe cramps   Remifentanil     Other reaction(s): Irregular Heart Rate   Fentanyl    Sulfa Antibiotics Nausea Only and Other (See Comments)    Social History   Socioeconomic History   Marital status: Married    Spouse name: Not on file   Number of children: Not on file   Years of education: Not on file   Highest education level: Not on file  Occupational History   Not on file  Tobacco Use   Smoking status: Never   Smokeless tobacco: Never  Vaping Use   Vaping Use: Never used  Substance and Sexual Activity   Alcohol use: Not Currently   Drug use:  Not Currently   Sexual activity: Not on file  Other Topics Concern   Not on file  Social History Narrative   Not on file   Social Determinants of Health   Financial Resource Strain: Not on file  Food Insecurity: Not on file  Transportation Needs: Not on file  Physical Activity: Not on file  Stress: Not on file  Social Connections: Not on file  Intimate Partner Violence: Not on file     Family History  Problem Relation Age of Onset   Heart disease Mother    Diabetes Father    Heart attack Father      Review of Systems Positive for shortness of breath Negative for: General:  chills, fever, night sweats or weight changes.  Cardiovascular: PND orthopnea syncope dizziness  Dermatological skin lesions rashes Respiratory: Cough congestion Urologic: Frequent urination urination at night and hematuria  Abdominal: negative for nausea, vomiting, diarrhea, bright red blood per rectum, melena, or hematemesis Neurologic: negative for visual changes, and/or hearing changes  All other systems reviewed and are otherwise negative except as noted above.  Labs: No results for input(s): CKTOTAL, CKMB, TROPONINI in the last 72 hours. Lab Results  Component Value Date   WBC 8.9 05/16/2021   HGB 11.1 (L) 05/16/2021   HCT 33.4 (L) 05/16/2021   MCV 88.4 05/16/2021   PLT 311 05/16/2021    Recent Labs  Lab 05/15/21 0617 05/16/21 0540  NA 132* 138  K 3.8 3.5  CL 96* 99  CO2 20* 33*  BUN 42* 35*  CREATININE 1.70* 1.63*  CALCIUM 8.5* 8.5*  PROT 7.2  --   BILITOT 0.5  --   ALKPHOS 104  --   ALT 16  --   AST 22  --   GLUCOSE 378* 135*   Lab Results  Component Value Date   CHOL 95 05/16/2021   HDL 40 (L) 05/16/2021   LDLCALC 45 05/16/2021   TRIG 52 05/16/2021   No results found for: DDIMER  Radiology/Studies:  DG Chest 1 View  Result Date: 05/15/2021 CLINICAL DATA:  70 year old female with shortness of breath and respiratory distress. EXAM: CHEST  1 VIEW COMPARISON:  Chest  radiographs 02/15/2006. FINDINGS: Portable AP upright view at 0634 hours. Similar lung volumes, and visible mediastinal contours appear normal. But there is diffuse increased pulmonary interstitial opacity, patchy bilateral perihilar opacity and confluent bibasilar opacity which most resembles pleural effusions. No pneumothorax or air bronchograms. Visualized tracheal air column is within normal limits. Paucity of bowel gas in the upper abdomen. No acute osseous abnormality identified. IMPRESSION: Abnormal lungs most suggestive of acute pulmonary edema with bilateral pleural effusions and atelectasis. Bilateral infection with pleural effusions felt less likely. Electronically Signed   By: Odessa Fleming M.D.   On: 05/15/2021 07:06   ECHOCARDIOGRAM COMPLETE  Result Date: 05/15/2021    ECHOCARDIOGRAM REPORT   Patient Name:   KAITLYNNE WENZ Date of Exam: 05/15/2021 Medical Rec #:  [id number]      Height:       60.0 in Accession #:    [id number]     Weight:       109.0 lb Date of Birth:  04/25/1951      BSA:          1.442 m Patient Age:    69 years       BP:           144/106 mmHg Patient Gender: F              HR:           55 bpm. Exam Location:  [facility] Procedure: 2D Echo Indications:     Systolic CHF  History:         Patient has no prior history of Echocardiogram examinations.                  Previous Myocardial Infarction; Risk Factors:Hypertension.  Sonographer:     L Thornton-Maynard Referring Phys:  [id number] Brien Few NIU Diagnosing Phys: Julien Nordmann MD IMPRESSIONS  1. Select images concerning for small inferior wall thrombus.  2. Left ventricular ejection fraction, by estimation, is 40 to 45%. The left ventricle has mildly decreased function. The left ventricle demonstrates regional wall motion abnormalities (inferior wall hypokinesis). There is mild left ventricular hypertrophy. Left ventricular diastolic parameters are consistent with Grade II diastolic dysfunction (pseudonormalization).  3. Right ventricular systolic  function is normal. The right ventricular size is normal. There is moderately elevated pulmonary artery systolic pressure. The estimated right ventricular systolic pressure is 49.7 mmHg.  4. Left atrial size was mildly dilated.  5. The mitral valve is normal in structure, though not well visualized. Severe mitral valve regurgitation.  6. Tricuspid valve regurgitation is moderate.  7. The aortic valve is normal in structure. Aortic valve regurgitation is mild.  8. The inferior vena cava is dilated in size with >50% respiratory variability, suggesting right atrial pressure of 8 mmHg. FINDINGS  Left Ventricle: Left ventricular ejection fraction, by estimation, is 40 to 45%. The left ventricle has mildly decreased function. The left ventricle demonstrates regional wall motion abnormalities. The left ventricular internal cavity size was normal in size. There is mild left ventricular hypertrophy. Left ventricular diastolic parameters are consistent with Grade II diastolic dysfunction (pseudonormalization). Right Ventricle: The right ventricular size is normal. No increase in right ventricular wall thickness. Right ventricular systolic function is normal. There is moderately elevated pulmonary artery systolic pressure. The tricuspid regurgitant velocity is 3.15 m/s, and with an assumed right atrial pressure of 10 mmHg, the estimated right ventricular systolic pressure is 49.7 mmHg. Left Atrium: Left atrial size was mildly dilated. Right Atrium: Right atrial size was normal in size. Pericardium: There is no evidence of pericardial effusion. Mitral Valve: The mitral valve was not well visualized. Severe mitral valve regurgitation. No evidence of mitral valve stenosis. MV peak gradient, 9.7 mmHg. The mean mitral valve gradient is 3.0 mmHg. Tricuspid Valve: The tricuspid valve is normal in structure. Tricuspid valve regurgitation is moderate . No evidence of tricuspid stenosis. Aortic Valve: The aortic valve is normal in  structure. Aortic valve regurgitation is mild. Aortic regurgitation PHT measures 719 msec. No aortic stenosis is present. Aortic valve mean gradient measures 3.0 mmHg. Aortic valve peak gradient measures 5.5 mmHg. Aortic valve area, by VTI measures 1.72 cm. Pulmonic Valve: The pulmonic valve was normal in structure. Pulmonic valve regurgitation is not visualized. No evidence of pulmonic stenosis. Aorta: The aortic root is normal in size and structure. Venous: The inferior vena cava is dilated in size with greater than 50% respiratory variability, suggesting right atrial pressure of 8 mmHg. IAS/Shunts: No atrial level shunt detected by color flow Doppler.  LEFT VENTRICLE PLAX 2D LVIDd:         3.70 cm     Diastology LVIDs:         2.50 cm     LV e' medial:    3.08 cm/s LV PW:         1.00 cm     LV E/e' medial:  41.2 LV IVS:        1.30 cm     LV e' lateral:   4.65 cm/s LVOT diam:     1.80 cm     LV E/e' lateral: 27.3 LV SV:         49 LV SV Index:   34 LVOT Area:     2.54 cm  LV Volumes (MOD) LV vol d, MOD A2C: 68.4 ml LV vol d, MOD A4C: 68.6 ml LV vol s, MOD A2C: 32.9 ml LV vol s, MOD A4C: 31.5 ml LV SV MOD A2C:     35.5 ml LV SV MOD A4C:     68.6 ml LV SV MOD BP:      37.2 ml RIGHT VENTRICLE RV S prime:     11.40 cm/s TAPSE (  M-mode): 2.6 cm LEFT ATRIUM             Index       RIGHT ATRIUM          Index LA diam:        4.40 cm 3.05 cm/m  RA Area:     9.65 cm LA Vol (A2C):   50.2 ml 34.81 ml/m RA Volume:   19.80 ml 13.73 ml/m LA Vol (A4C):   54.5 ml 37.79 ml/m LA Biplane Vol: 54.4 ml 37.72 ml/m  AORTIC VALVE                   PULMONIC VALVE AV Area (Vmax):    1.76 cm    PV Vmax:       0.90 m/s AV Area (Vmean):   1.80 cm    PV Peak grad:  3.2 mmHg AV Area (VTI):     1.72 cm AV Vmax:           117.00 cm/s AV Vmean:          86.300 cm/s AV VTI:            0.284 m AV Peak Grad:      5.5 mmHg AV Mean Grad:      3.0 mmHg LVOT Vmax:         80.90 cm/s LVOT Vmean:        61.100 cm/s LVOT VTI:          0.192 m  LVOT/AV VTI ratio: 0.68 AI PHT:            719 msec  AORTA Ao Root diam: 2.80 cm MITRAL VALVE                TRICUSPID VALVE MV Area (PHT): 3.06 cm     TR Peak grad:   39.7 mmHg MV Area VTI:   0.92 cm     TR Vmax:        315.00 cm/s MV Peak grad:  9.7 mmHg MV Mean grad:  3.0 mmHg     SHUNTS MV Vmax:       1.56 m/s     Systemic VTI:  0.19 m MV Vmean:      73.0 cm/s    Systemic Diam: 1.80 cm MV E velocity: 127.00 cm/s MV A velocity: 93.80 cm/s MV E/A ratio:  1.35 Julien Nordmannimothy Gollan MD Electronically signed by Julien Nordmannimothy Gollan MD Signature Date/Time: 05/15/2021/3:45:18 PM    Final     EKG: Normal sinus rhythm left atrial enlargement poor R wave progression  Weights: Filed Weights   05/15/21 0617  Weight: 49.4 kg     Physical Exam: Blood pressure (!) 96/49, pulse 61, temperature 98 F (36.7 C), resp. rate 20, height 5' (1.524 m), weight 49.4 kg, SpO2 93 %. Body mass index is 21.29 kg/m. General: Well developed, well nourished, in no acute distress. Head eyes ears nose throat: Normocephalic, atraumatic, sclera non-icteric, no xanthomas, nares are without discharge. No apparent thyromegaly and/or mass  Lungs: Normal respiratory effort.  no wheezes, n bibasilar rales, no rhonchi.  Heart: RRR with normal S1 S2. no murmur gallop, no rub, PMI is normal size and placement, carotid upstroke normal without bruit, jugular venous pressure is normal Abdomen: Soft, non-tender, non-distended with normoactive bowel sounds. No hepatomegaly. No rebound/guarding. No obvious abdominal masses. Abdominal aorta is normal size without bruit Extremities: N trace edema. no cyanosis, no clubbing, no ulcers  Peripheral : 2+ bilateral upper extremity pulses,  2+ bilateral femoral pulses, 2+ bilateral dorsal pedal pulse Neuro: Alert and oriented. No facial asymmetry. No focal deficit. Moves all extremities spontaneously. Musculoskeletal: Normal muscle tone without kyphosis Psych:  Responds to questions appropriately with a normal  affect.    Assessment: 70 year old female with recent ST elevation myocardial infarction status post PCI and stent placement left circumflex with a completely occluded PDA and collaterals from LAD to PDA with 70% stenosis of LAD with paroxysmal nonvalvular atrial fibrillation and acute systolic dysfunction congestive heart failure with glomerular filtration rate 32 and an elevated BNP and no current evidence of acute coronary syndrome  Plan: 1.  Continuation of amiodarone for maintenance of normal sinus rhythm 2.  Single antiplatelet therapy for recent episode of STEMI 3.  No additional blood pressure medication management due to low blood pressure 4.  Intravenous furosemide for pulmonary edema lower extremity edema and acute systolic dysfunction congestive heart failure and flash pulmonary edema 5.  Further consideration of cardiac catheterization to assess coronary anatomy and further treatment thereof is necessary.  Patient understands the risk and benefits of cardiac catheterization.  This includes the possibility of death stroke heart attack infection bleeding or blood clot she is at low risk for conscious sedation  Signed, Lamar Blinks M.D. [facility] Cardiology 05/16/2021, 7:33 PM

## 2021-05-17 LAB — BASIC METABOLIC PANEL
Anion gap: 7 (ref 5–15)
BUN: 30 mg/dL — ABNORMAL HIGH (ref 8–23)
CO2: 31 mmol/L (ref 22–32)
Calcium: 8.4 mg/dL — ABNORMAL LOW (ref 8.9–10.3)
Chloride: 98 mmol/L (ref 98–111)
Creatinine, Ser: 1.1 mg/dL — ABNORMAL HIGH (ref 0.44–1.00)
GFR, Estimated: 54 mL/min — ABNORMAL LOW (ref >60)
Glucose, Bld: 218 mg/dL — ABNORMAL HIGH (ref 70–99)
Potassium: 3.2 mmol/L — ABNORMAL LOW (ref 3.5–5.1)
Sodium: 136 mmol/L (ref 135–145)

## 2021-05-17 LAB — GLUCOSE, CAPILLARY
Glucose-Capillary: 238 mg/dL — ABNORMAL HIGH (ref 70–99)
Glucose-Capillary: 299 mg/dL — ABNORMAL HIGH (ref 70–99)
Glucose-Capillary: 232 mg/dL — ABNORMAL HIGH (ref 70–99)
Glucose-Capillary: 66 mg/dL — ABNORMAL LOW (ref 70–99)
Glucose-Capillary: 84 mg/dL (ref 70–99)
Glucose-Capillary: 235 mg/dL — ABNORMAL HIGH (ref 70–99)

## 2021-05-17 LAB — MAGNESIUM: Magnesium: 1.9 mg/dL (ref 1.7–2.4)

## 2021-05-17 MED ORDER — SODIUM CHLORIDE 0.9 % IV SOLN: 250.0000 mL | INTRAVENOUS | Status: DC | PRN

## 2021-05-17 MED ORDER — FUROSEMIDE 10 MG/ML IJ SOLN
40.0000 mg | Freq: Every day | INTRAMUSCULAR | Status: DC
  Administered 2021-05-17 – 2021-05-18 (×2): 40 mg via INTRAVENOUS
  Filled 2021-05-17 (×2): qty 4

## 2021-05-17 MED ORDER — POTASSIUM CHLORIDE CRYS ER 20 MEQ PO TBCR
40.0000 meq | EXTENDED_RELEASE_TABLET | Freq: Once | ORAL | Status: AC
  Administered 2021-05-17: 40 meq via ORAL
  Filled 2021-05-17: qty 2

## 2021-05-17 MED ORDER — ASPIRIN 81 MG PO CHEW
81.0000 mg | CHEWABLE_TABLET | ORAL | Status: AC
  Administered 2021-05-18: 81 mg via ORAL
  Filled 2021-05-17: qty 1

## 2021-05-17 MED ORDER — SODIUM CHLORIDE 0.9 % WEIGHT BASED INFUSION
1.0000 mL/kg/h | INTRAVENOUS | Status: DC
  Administered 2021-05-18: 1 mL/kg/h via INTRAVENOUS

## 2021-05-17 MED ORDER — SODIUM CHLORIDE 0.9 % WEIGHT BASED INFUSION
3.0000 mL/kg/h | INTRAVENOUS | Status: AC
  Administered 2021-05-18: 3 mL/kg/h via INTRAVENOUS

## 2021-05-17 MED ORDER — SODIUM CHLORIDE 0.9% FLUSH: 3.0000 mL | INTRAVENOUS | Status: DC | PRN

## 2021-05-17 MED ORDER — SODIUM CHLORIDE 0.9% FLUSH
3.0000 mL | Freq: Two times a day (BID) | INTRAVENOUS | Status: DC
  Administered 2021-05-17 – 2021-05-19 (×4): 3 mL via INTRAVENOUS

## 2021-05-17 NOTE — Consult Note (Signed)
   Heart Failure Nurse Navigator Note  HFrEF 45%  She presented to the emergency room with complaints of SOB.  BNP was 1373  Comorbidities:  Hypertension Hyperlipidemia Diabetes Atrial fibrillation Chronic kidney disease Coronary artery disease with recent stenting  Medications:  Amiodarone 200 mg daily Plavix 75 mg daily Lasix 40 mg iv Crestor 40 mg every morning  Labs:  Sodium 136, potassium 3.2, chloride 98, CO2 21, BUN 30, creatinine 1.10 magnesium 1.9 Intake not documented Output 900 Weight 50.8 up from 49.9 kg of yesterday     Initial meeting with patient.  She is currently lying in bed in no acute distress.   Discussed heart failure and function of her heart.  Stressed the importance of low-sodium diet and fluid restriction.  Discussed daily weights and what to report to physician.  Discussed following up in the heart failure outpatient clinic.  She was given living with heart failure teaching booklet, zone magnet.  Continue to follow.  Tresa Endo RN [facility]

## 2021-05-17 NOTE — Progress Notes (Signed)
CSW consulted for heart failure screen, notes heart failure RN Ricarda Frame has seen. No discharge needs identified at this time.   [place], [place] [phone]

## 2021-05-17 NOTE — Progress Notes (Signed)
Inpatient Diabetes Program Recommendations  AACE/ADA: New Consensus Statement on Inpatient Glycemic Control  Target Ranges:  Prepandial:   less than 140 mg/dL      Peak postprandial:   less than 180 mg/dL (1-2 hours)      Critically ill patients:  140 - 180 mg/dL   Results for ARLYNE, BRANDES (MRN [id number]) as of 05/17/2021 10:32  Ref. Range 05/16/2021 07:41 05/16/2021 11:34 05/16/2021 16:41 05/16/2021 20:13 05/17/2021 07:43  Glucose-Capillary Latest Ref Range: 70 - 99 mg/dL 177 (H) 116 (H) 579 (H) 374 (H) 238 (H)    Review of Glycemic Control  Diabetes history: DM Outpatient Diabetes medications: Lantus 10 units daily, Novolog 4 units TID with meals Current orders for Inpatient glycemic control: Lantus 10 units daily, Novolog 0-9 units TID with meals, Novolog 0-5 units QHS  Inpatient Diabetes Program Recommendations:    Insulin: Please consider ordering Novolog 3 units TID with meals for meal coverage if patient eats at least 50% of meals.  Thanks, Orlando Penner, RN, MSN, CDE Diabetes Coordinator Inpatient Diabetes Program [phone] (Team Pager from 8am to 5pm)

## 2021-05-17 NOTE — Progress Notes (Addendum)
[facility] Progress Note  Patient: Kylie Thomas    EQA:[id number]  DOA: 05/15/2021     Date of Service: the patient was seen and examined on 05/17/2021  Brief hospital course: Past medical history of HTN, HLD, type II DM, GERD, depression chronic A. fib, CKD 3B, CAD with recent STEMI SP PCI to circumflex, HFrEF with EF 45%. Presents with complaints of acute worsening of shortness of breath and hypoxia likely secondary to flash pulmonary edema. Currently plan is IV Lasix and follow cardiology recommendation.  Assessment and Plan: 1.  Acute hypoxic respiratory failure, POA Acute on chronic systolic CHF/pulmonary edema Presenting complaints of shortness of breath.  Currently needing 3 L of oxygen. Positive JVD on exam.  Chest x-ray shows vascular congestion. BNP elevated. Started on IV Lasix 40 mg twice daily. EKG shows no acute ischemia and troponins are also not consistent with ACS territory. Continue with IV Lasix and monitor response. Currently consulted for further assistance.  2.  CAD with recent STEMI Elevated troponin Posterior STEMI on May 2022.  Cath showed circumflex and LAD disease.  Had stent placed on circumflex. Loaded with Brilinta back then but currently on Plavix and therapeutic anticoagulation with Eliquis. Continue Crestor continue metoprolol. Troponins not consistent with ACS although not sure whether LAD stenosis is playing a role in patient's presentation with flash pulmonary edema. Cardiology consult for further assistance. Cardiac cath scheduled for tomorrow.  3.  Chronic A. fib Started to have this since her recent hospitalization for STEMI. Continue metoprolol continue amiodarone. Hold Eliquis for cath tomorrow.  4.  HTN Blood pressure soft. Will hold medication.  5.  HLD Continue Crestor  6.  Type II DM, uncontrolled with hyperglycemia with long-term insulin use with renal complication Renal function stable. Holding home regimen. Continue  current sliding scale and Levemir. Monitor.  7.  CKD 3A Baseline serum creatinine around 1.4-1.5.  Currently stable.  Monitor.  8.  Depression with anxiety. Continue home medication. No suicidal ideation.  9.  Concern for sepsis or UTI.  Ruled out. Asymptomatic bacteriuria Patient had met SIRS criteria with leucocytosis and tachypnea but likely due to presentation with acute CHF.  Leucocytosis is resolved, tachypnea improving, no fever or chills.  Stop Antibiotics.  Patient remains afebrile without antibiotics. Urine culture is growing E. faecalis.  Diet: cardiac diet DVT Prophylaxis: Therapeutic Anticoagulation with apixaban       Advance goals of care discussion: Full code  Family Communication: no family was present at bedside, at the time of interview.   Disposition:  Status is: Inpatient  Remains inpatient appropriate because:Inpatient level of care appropriate due to severity of illness  Dispo: The patient is from: Home              Anticipated d/c is to: Home              Patient currently is not medically stable to d/c.   Difficult to place patient No  Subjective: Breathing still heavy.  No nausea no vomiting.  Occasional chest tightness still present.  No abdominal pain.  Physical Exam:  General: Appear in mild distress, no Rash; Oral Mucosa Clear, moist. no Abnormal Neck Mass Or lumps, Conjunctiva normal  Cardiovascular: S1 and S2 Present, no Murmur, Respiratory: good respiratory effort, Bilateral Air entry present and bilateral Crackles, no wheezes Abdomen: Bowel Sound present, Soft and no tenderness Extremities: no Pedal edema Neurology: alert and oriented to time, place, and person affect appropriate. no new focal deficit Gait not  checked due to patient safety concerns  Vitals:   05/17/21 0417 05/17/21 0744 05/17/21 1224 05/17/21 1630  BP: (!) 114/58 128/69 (!) 118/59 109/67  Pulse: 64 63 63 64  Resp: _0 Temp: 98.5 F (36.9 C) 98.2 F  (36.8 C) 97.8 F (36.6 C) 97.8 F (36.6 C)  TempSrc: Oral Oral Oral   SpO2: 97% 96% 96% 96%  Weight: 50.8 kg     Height:        Intake/Output Summary (Last 24 hours) at 05/17/2021 1827 Last data filed at 05/17/2021 1019 Gross per 24 hour  Intake 240 ml  Output 0 ml  Net 240 ml    Filed Weights   05/15/21 0617 05/17/21 0417  Weight: 49.4 kg 50.8 kg    Data Reviewed: I have personally reviewed and interpreted daily labs, tele strips, imaging. I reviewed all nursing notes, pharmacy notes, vitals, pertinent old records I have discussed plan of care as described above with RN and patient/family.  CBC: Recent Labs  Lab 05/15/21 0617 05/16/21 0540  WBC 15.4* 8.9  HGB 12.9 11.1*  HCT 39.3 33.4*  MCV 91.6 88.4  PLT 408* 940    Basic Metabolic Panel: Recent Labs  Lab 05/15/21 0617 05/16/21 0540 05/17/21 0812  NA 132* 138 136  K 3.8 3.5 3.2*  CL 96* 99 98  CO2 20* 33* 31  GLUCOSE 378* 135* 218*  BUN 42* 35* 30*  CREATININE 1.70* 1.63* 1.10*  CALCIUM 8.5* 8.5* 8.4*  MG  --  2.0 1.9     Studies: No results found.  Scheduled Meds:  amiodarone  200 mg Oral Daily   clopidogrel  75 mg Oral Daily   dorzolamide-timolol  1 drop Both Eyes BID   DULoxetine  60 mg Oral Daily   furosemide  40 mg Intravenous Daily   insulin aspart  0-5 Units Subcutaneous QHS   insulin aspart  0-9 Units Subcutaneous TID WC   insulin glargine  10 Units Subcutaneous Daily   mirtazapine  7.5 mg Oral QHS   pantoprazole  20 mg Oral Daily   rosuvastatin  40 mg Oral q morning   sodium chloride flush  3 mL Intravenous Q12H   Continuous Infusions: PRN Meds: acetaminophen, albuterol, gabapentin, hydrALAZINE, loratadine, nitroGLYCERIN, ondansetron (ZOFRAN) IV  Time spent: 35 minutes  Author: Berle Mull, MD [facility] 05/17/2021 6:27 PM  To reach On-call, see care teams to locate the attending and reach out via [email or web address]. Between 7PM-7AM, please contact night-coverage If you  still have difficulty reaching the attending provider, please page the [facility] (Director on Call) for [facility] on amion for assistance.

## 2021-05-17 NOTE — Progress Notes (Signed)
[facility] Cardiology [facility] Encounter Note  Patient: Kylie Thomas / Admit Date: 05/15/2021 / Date of Encounter: 05/17/2021, 1:28 PM   Subjective: Patient overall breathing better today no evidence of significant new cardiac symptoms although does have a little bit of chest pain last night.  Hemodynamically stable today.  No evidence of atrial fibrillation by telemetry.  We have discussed the possibility of further cardiac catheterization for unstable angina and the potential for a significant stenosis of left anterior descending artery.  Echocardiogram with mild inferior hypokinesis with ejection fraction of 40%  Review of Systems: Positive for: Chest pain Negative for: Vision change, hearing change, syncope, dizziness, nausea, vomiting,diarrhea, bloody stool, stomach pain, cough, congestion, diaphoresis, urinary frequency, urinary pain,skin lesions, skin rashes Others previously listed  Objective: Telemetry: Normal sinus rhythm Physical Exam: Blood pressure (!) 118/59, pulse 63, temperature 97.8 F (36.6 C), temperature source Oral, resp. rate 17, height 5' (1.524 m), weight 50.8 kg, SpO2 96 %. Body mass index is 21.85 kg/m. General: Well developed, well nourished, in no acute distress. Head: Normocephalic, atraumatic, sclera non-icteric, no xanthomas, nares are without discharge. Neck: No apparent masses Lungs: Normal respirations with no wheezes, no rhonchi, no rales , no crackles   Heart: Regular rate and rhythm, normal S1 S2, no murmur, no rub, no gallop, PMI is normal size and placement, carotid upstroke normal without bruit, jugular venous pressure normal Abdomen: Soft, non-tender, non-distended with normoactive bowel sounds. No hepatosplenomegaly. Abdominal aorta is normal size without bruit Extremities: No edema, no clubbing, no cyanosis, no ulcers,  Peripheral: 2+ radial, 2+ femoral, 2+ dorsal pedal pulses Neuro: Alert and oriented. Moves all extremities  spontaneously. Psych:  Responds to questions appropriately with a normal affect.   Intake/Output Summary (Last 24 hours) at 05/17/2021 1328 Last data filed at 05/17/2021 1019 Gross per 24 hour  Intake 240 ml  Output 0 ml  Net 240 ml    Inpatient Medications:   amiodarone  200 mg Oral Daily   clopidogrel  75 mg Oral Daily   dorzolamide-timolol  1 drop Both Eyes BID   DULoxetine  60 mg Oral Daily   furosemide  40 mg Intravenous Daily   insulin aspart  0-5 Units Subcutaneous QHS   insulin aspart  0-9 Units Subcutaneous TID WC   insulin glargine  10 Units Subcutaneous Daily   mirtazapine  7.5 mg Oral QHS   pantoprazole  20 mg Oral Daily   rosuvastatin  40 mg Oral q morning   sodium chloride flush  3 mL Intravenous Q12H   Infusions:   Labs: Recent Labs    05/16/21 0540 05/17/21 0812  NA 138 136  K 3.5 3.2*  CL 99 98  CO2 33* 31  GLUCOSE 135* 218*  BUN 35* 30*  CREATININE 1.63* 1.10*  CALCIUM 8.5* 8.4*  MG 2.0 1.9   Recent Labs    05/15/21 0617  AST 22  ALT 16  ALKPHOS 104  BILITOT 0.5  PROT 7.2  ALBUMIN 3.3*   Recent Labs    05/15/21 0617 05/16/21 0540  WBC 15.4* 8.9  HGB 12.9 11.1*  HCT 39.3 33.4*  MCV 91.6 88.4  PLT 408* 311   No results for input(s): CKTOTAL, CKMB, TROPONINI in the last 72 hours. Invalid input(s): POCBNP No results for input(s): HGBA1C in the last 72 hours.   Weights: Filed Weights   05/15/21 0617 05/17/21 0417  Weight: 49.4 kg 50.8 kg     Radiology/Studies:  DG Chest 1 View  Result Date: 05/15/2021 CLINICAL DATA:  70 year old female with shortness of breath and respiratory distress. EXAM: CHEST  1 VIEW COMPARISON:  Chest radiographs 02/15/2006. FINDINGS: Portable AP upright view at 0634 hours. Similar lung volumes, and visible mediastinal contours appear normal. But there is diffuse increased pulmonary interstitial opacity, patchy bilateral perihilar opacity and confluent bibasilar opacity which most resembles pleural  effusions. No pneumothorax or air bronchograms. Visualized tracheal air column is within normal limits. Paucity of bowel gas in the upper abdomen. No acute osseous abnormality identified. IMPRESSION: Abnormal lungs most suggestive of acute pulmonary edema with bilateral pleural effusions and atelectasis. Bilateral infection with pleural effusions felt less likely. Electronically Signed   By: Odessa Fleming M.D.   On: 05/15/2021 07:06   ECHOCARDIOGRAM COMPLETE  Result Date: 05/15/2021    ECHOCARDIOGRAM REPORT   Patient Name:   Kylie Thomas Date of Exam: 05/15/2021 Medical Rec #:  [id number]      Height:       60.0 in Accession #:    [id number]     Weight:       109.0 lb Date of Birth:  Oct 26, 1951      BSA:          1.442 m Patient Age:    70 years       BP:           144/106 mmHg Patient Gender: F              HR:           55 bpm. Exam Location:  [facility] Procedure: 2D Echo Indications:     Systolic CHF  History:         Patient has no prior history of Echocardiogram examinations.                  Previous Myocardial Infarction; Risk Factors:Hypertension.  Sonographer:     L Thornton-Maynard Referring Phys:  [id number] Brien Few NIU Diagnosing Phys: Julien Nordmann MD IMPRESSIONS  1. Select images concerning for small inferior wall thrombus.  2. Left ventricular ejection fraction, by estimation, is 40 to 45%. The left ventricle has mildly decreased function. The left ventricle demonstrates regional wall motion abnormalities (inferior wall hypokinesis). There is mild left ventricular hypertrophy. Left ventricular diastolic parameters are consistent with Grade II diastolic dysfunction (pseudonormalization).  3. Right ventricular systolic function is normal. The right ventricular size is normal. There is moderately elevated pulmonary artery systolic pressure. The estimated right ventricular systolic pressure is 49.7 mmHg.  4. Left atrial size was mildly dilated.  5. The mitral valve is normal in structure, though not well visualized.  Severe mitral valve regurgitation.  6. Tricuspid valve regurgitation is moderate.  7. The aortic valve is normal in structure. Aortic valve regurgitation is mild.  8. The inferior vena cava is dilated in size with >50% respiratory variability, suggesting right atrial pressure of 8 mmHg. FINDINGS  Left Ventricle: Left ventricular ejection fraction, by estimation, is 40 to 45%. The left ventricle has mildly decreased function. The left ventricle demonstrates regional wall motion abnormalities. The left ventricular internal cavity size was normal in size. There is mild left ventricular hypertrophy. Left ventricular diastolic parameters are consistent with Grade II diastolic dysfunction (pseudonormalization). Right Ventricle: The right ventricular size is normal. No increase in right ventricular wall thickness. Right ventricular systolic function is normal. There is moderately elevated pulmonary artery systolic pressure. The tricuspid regurgitant velocity is 3.15 m/s, and with an assumed right atrial pressure of  10 mmHg, the estimated right ventricular systolic pressure is 49.7 mmHg. Left Atrium: Left atrial size was mildly dilated. Right Atrium: Right atrial size was normal in size. Pericardium: There is no evidence of pericardial effusion. Mitral Valve: The mitral valve was not well visualized. Severe mitral valve regurgitation. No evidence of mitral valve stenosis. MV peak gradient, 9.7 mmHg. The mean mitral valve gradient is 3.0 mmHg. Tricuspid Valve: The tricuspid valve is normal in structure. Tricuspid valve regurgitation is moderate . No evidence of tricuspid stenosis. Aortic Valve: The aortic valve is normal in structure. Aortic valve regurgitation is mild. Aortic regurgitation PHT measures 719 msec. No aortic stenosis is present. Aortic valve mean gradient measures 3.0 mmHg. Aortic valve peak gradient measures 5.5 mmHg. Aortic valve area, by VTI measures 1.72 cm. Pulmonic Valve: The pulmonic valve was normal  in structure. Pulmonic valve regurgitation is not visualized. No evidence of pulmonic stenosis. Aorta: The aortic root is normal in size and structure. Venous: The inferior vena cava is dilated in size with greater than 50% respiratory variability, suggesting right atrial pressure of 8 mmHg. IAS/Shunts: No atrial level shunt detected by color flow Doppler.  LEFT VENTRICLE PLAX 2D LVIDd:         3.70 cm     Diastology LVIDs:         2.50 cm     LV e' medial:    3.08 cm/s LV PW:         1.00 cm     LV E/e' medial:  41.2 LV IVS:        1.30 cm     LV e' lateral:   4.65 cm/s LVOT diam:     1.80 cm     LV E/e' lateral: 27.3 LV SV:         49 LV SV Index:   34 LVOT Area:     2.54 cm  LV Volumes (MOD) LV vol d, MOD A2C: 68.4 ml LV vol d, MOD A4C: 68.6 ml LV vol s, MOD A2C: 32.9 ml LV vol s, MOD A4C: 31.5 ml LV SV MOD A2C:     35.5 ml LV SV MOD A4C:     68.6 ml LV SV MOD BP:      37.2 ml RIGHT VENTRICLE RV S prime:     11.40 cm/s TAPSE (M-mode): 2.6 cm LEFT ATRIUM             Index       RIGHT ATRIUM          Index LA diam:        4.40 cm 3.05 cm/m  RA Area:     9.65 cm LA Vol (A2C):   50.2 ml 34.81 ml/m RA Volume:   19.80 ml 13.73 ml/m LA Vol (A4C):   54.5 ml 37.79 ml/m LA Biplane Vol: 54.4 ml 37.72 ml/m  AORTIC VALVE                   PULMONIC VALVE AV Area (Vmax):    1.76 cm    PV Vmax:       0.90 m/s AV Area (Vmean):   1.80 cm    PV Peak grad:  3.2 mmHg AV Area (VTI):     1.72 cm AV Vmax:           117.00 cm/s AV Vmean:          86.300 cm/s AV VTI:  0.284 m AV Peak Grad:      5.5 mmHg AV Mean Grad:      3.0 mmHg LVOT Vmax:         80.90 cm/s LVOT Vmean:        61.100 cm/s LVOT VTI:          0.192 m LVOT/AV VTI ratio: 0.68 AI PHT:            719 msec  AORTA Ao Root diam: 2.80 cm MITRAL VALVE                TRICUSPID VALVE MV Area (PHT): 3.06 cm     TR Peak grad:   39.7 mmHg MV Area VTI:   0.92 cm     TR Vmax:        315.00 cm/s MV Peak grad:  9.7 mmHg MV Mean grad:  3.0 mmHg     SHUNTS MV Vmax:        1.56 m/s     Systemic VTI:  0.19 m MV Vmean:      73.0 cm/s    Systemic Diam: 1.80 cm MV E velocity: 127.00 cm/s MV A velocity: 93.80 cm/s MV E/A ratio:  1.35 Julien Nordmannimothy Gollan MD Electronically signed by Julien Nordmannimothy Gollan MD Signature Date/Time: 05/15/2021/3:45:18 PM    Final      Assessment and Recommendation  70 y.o. female with paroxysmal nonvalvular atrial fibrillation known coronary artery disease with acute on chronic systolic dysfunction congestive heart failure and unstable angina slightly improved 1.  Continuation of amiodarone for further risk reduction in potential for atrial fibrillation at current dose without change 2.  Continue furosemide for acute on chronic systolic dysfunction congestive heart failure and pulmonary edema 3.  Abstain from Eliquis at this time to reduce potential for bleeding for cardiac catheterization tomorrow 4.  High intensity cholesterol therapy for coronary artery disease and previous myocardial infarction 5.  Further treatment options after above  Signed, Arnoldo HookerBruce Lula Kolton M.D. FACC

## 2021-05-18 ENCOUNTER — Encounter
Admission: EM | Disposition: A | Discharge status: Home / Self Care | Payer: Self-pay | Source: Home / Self Care | Attending: Internal Medicine

## 2021-05-18 HISTORY — PX: LEFT HEART CATH AND CORONARY ANGIOGRAPHY: CATH118249

## 2021-05-18 LAB — URINE CULTURE: Culture: >=100000 — AB

## 2021-05-18 LAB — MAGNESIUM: Magnesium: 1.9 mg/dL (ref 1.7–2.4)

## 2021-05-18 LAB — CBC
HCT: 37.4 % (ref 36.0–46.0)
Hemoglobin: 12.4 g/dL (ref 12.0–15.0)
MCH: 29.5 pg (ref 26.0–34.0)
MCHC: 33.2 g/dL (ref 30.0–36.0)
MCV: 88.8 fL (ref 80.0–100.0)
Platelets: 304 10*3/uL (ref 150–400)
RBC: 4.21 MIL/uL (ref 3.87–5.11)
RDW: 12.9 % (ref 11.5–15.5)
WBC: 8 10*3/uL (ref 4.0–10.5)
nRBC: 0 % (ref 0.0–0.2)

## 2021-05-18 LAB — BASIC METABOLIC PANEL
Anion gap: 5 (ref 5–15)
BUN: 23 mg/dL (ref 8–23)
CO2: 32 mmol/L (ref 22–32)
Calcium: 8.6 mg/dL — ABNORMAL LOW (ref 8.9–10.3)
Chloride: 98 mmol/L (ref 98–111)
Creatinine, Ser: 1.19 mg/dL — ABNORMAL HIGH (ref 0.44–1.00)
GFR, Estimated: 49 mL/min — ABNORMAL LOW (ref >60)
Glucose, Bld: 259 mg/dL — ABNORMAL HIGH (ref 70–99)
Potassium: 3.9 mmol/L (ref 3.5–5.1)
Sodium: 135 mmol/L (ref 135–145)

## 2021-05-18 LAB — GLUCOSE, CAPILLARY
Glucose-Capillary: 263 mg/dL — ABNORMAL HIGH (ref 70–99)
Glucose-Capillary: 218 mg/dL — ABNORMAL HIGH (ref 70–99)
Glucose-Capillary: 210 mg/dL — ABNORMAL HIGH (ref 70–99)
Glucose-Capillary: 286 mg/dL — ABNORMAL HIGH (ref 70–99)
Glucose-Capillary: 518 mg/dL (ref 70–99)

## 2021-05-18 LAB — GLUCOSE, RANDOM: Glucose, Bld: 488 mg/dL — ABNORMAL HIGH (ref 70–99)

## 2021-05-18 SURGERY — LEFT HEART CATH AND CORONARY ANGIOGRAPHY
Anesthesia: Moderate Sedation

## 2021-05-18 MED ORDER — MIDAZOLAM HCL 2 MG/2ML IJ SOLN
INTRAMUSCULAR | Status: DC | PRN
  Administered 2021-05-18: 2 mg via INTRAVENOUS

## 2021-05-18 MED ORDER — ISOSORBIDE MONONITRATE ER 30 MG PO TB24
30.0000 mg | ORAL_TABLET | Freq: Every day | ORAL | Status: DC
  Administered 2021-05-18 – 2021-05-19 (×2): 30 mg via ORAL
  Filled 2021-05-18 (×3): qty 1

## 2021-05-18 MED ORDER — FUROSEMIDE 20 MG PO TABS: 60.0000 mg | ORAL_TABLET | Freq: Two times a day (BID) | ORAL | 0 refills | Status: DC

## 2021-05-18 MED ORDER — INSULIN GLARGINE 100 UNIT/ML ~~LOC~~ SOLN
5.0000 [IU] | Freq: Once | SUBCUTANEOUS | Status: AC
  Administered 2021-05-18: 5 [IU] via SUBCUTANEOUS
  Filled 2021-05-18: qty 0.05

## 2021-05-18 MED ORDER — IOHEXOL 300 MG/ML  SOLN
INTRAMUSCULAR | Status: DC | PRN
  Administered 2021-05-18: 78 mL

## 2021-05-18 MED ORDER — HEPARIN (PORCINE) IN NACL 1000-0.9 UT/500ML-% IV SOLN
INTRAVENOUS | Status: DC | PRN
  Administered 2021-05-18: 500 mL

## 2021-05-18 MED ORDER — INSULIN ASPART 100 UNIT/ML IJ SOLN
9.0000 [IU] | Freq: Once | INTRAMUSCULAR | Status: AC
  Administered 2021-05-18: 9 [IU] via SUBCUTANEOUS
  Filled 2021-05-18: qty 1

## 2021-05-18 MED ORDER — ISOSORBIDE MONONITRATE ER 30 MG PO TB24: 30.0000 mg | ORAL_TABLET | Freq: Every day | ORAL | 0 refills | Status: DC

## 2021-05-18 MED ORDER — MIDAZOLAM HCL 2 MG/2ML IJ SOLN
INTRAMUSCULAR | Status: AC
  Filled 2021-05-18: qty 2

## 2021-05-18 SURGICAL SUPPLY — 13 items
CATH INFINITI 5 FR 3DRC (CATHETERS) ×2 IMPLANT
CATH INFINITI 5FR JL4 (CATHETERS) ×2 IMPLANT
CATH INFINITI JR4 5F (CATHETERS) ×2 IMPLANT
DEVICE CLOSURE MYNXGRIP 5F (Vascular Products) ×2 IMPLANT
DRAPE BRACHIAL (DRAPES) ×2 IMPLANT
KIT SYRINGE INJ CVI SPIKEX1 (MISCELLANEOUS) ×2 IMPLANT
NEEDLE PERC 18GX7CM (NEEDLE) ×2 IMPLANT
PACK CARDIAC CATH (CUSTOM PROCEDURE TRAY) ×2 IMPLANT
PROTECTION STATION PRESSURIZED (MISCELLANEOUS) ×2
SET ATX SIMPLICITY (MISCELLANEOUS) ×2 IMPLANT
SHEATH AVANTI 5FR X 11CM (SHEATH) ×2 IMPLANT
STATION PROTECTION PRESSURIZED (MISCELLANEOUS) ×1 IMPLANT
WIRE GUIDERIGHT .035X150 (WIRE) ×2 IMPLANT

## 2021-05-18 NOTE — Care Management Important Message (Signed)
Important Message  Patient Details  Name: Kylie Thomas MRN: [id number] Date of Birth: 11-Apr-1951   Medicare Important Message Given:  Yes     Johnell Comings 05/18/2021, 10:50 AM

## 2021-05-18 NOTE — Plan of Care (Signed)

## 2021-05-18 NOTE — Progress Notes (Signed)
Hypoglycemic Event  CBG: 66  Treatment: 4 oz juice/soda  Symptoms: Sweaty  Follow-up CBG: Time: 2040 CBG Result:88  Possible Reasons for Event: inadequate meal intake  Comments/MD notified: Manuela Schwartz NP    Loraine Maple

## 2021-05-18 NOTE — Progress Notes (Signed)
[facility] Cardiology [facility] Encounter Note  Patient: Kylie Thomas / Admit Date: 05/15/2021 / Date of Encounter: 05/18/2021, 1:16 PM   Subjective: Patient overall breathing better today no evidence of significant new cardiac symptoms although does have a little bit of chest pain yesterday nigh Hemodynamically stable today.  No evidence of atrial fibrillation by telemetry.   Cardiac catheterization showing significant small and approximately occluded right coronary artery, occluded mid left-sided PDA, patent stented left circumflex, significant atherosclerosis of large diagonal and significant atherosclerosis of proximal left anterior descending artery.  Based on cardiac catheterization the patient treatment options are not abundantly straightforward.  It is possible that her chest pain and other symptoms are secondary to large diagonal not particularly amenable to PCI and stent placement.  Additionally symptoms and issues could be from occluded PDA and right coronary artery.  Therefore, we will likely maximally medicated and treat current issues with discussion with cardiovascular surgery and or other interventional Echocardiogram with mild inferior hypokinesis with ejection fraction of 40%  Review of Systems: Positive for: Chest pain Negative for: Vision change, hearing change, syncope, dizziness, nausea, vomiting,diarrhea, bloody stool, stomach pain, cough, congestion, diaphoresis, urinary frequency, urinary pain,skin lesions, skin rashes Others previously listed  Objective: Telemetry: Normal sinus rhythm Physical Exam: Blood pressure (!) 112/59, pulse 64, temperature 98 F (36.7 C), temperature source Oral, resp. rate 17, height 5' (1.524 m), weight 50.8 kg, SpO2 94 %. Body mass index is 21.85 kg/m. General: Well developed, well nourished, in no acute distress. Head: Normocephalic, atraumatic, sclera non-icteric, no xanthomas, nares are without discharge. Neck: No apparent  masses Lungs: Normal respirations with no wheezes, no rhonchi, no rales , no crackles   Heart: Regular rate and rhythm, normal S1 S2, no murmur, no rub, no gallop, PMI is normal size and placement, carotid upstroke normal without bruit, jugular venous pressure normal Abdomen: Soft, non-tender, non-distended with normoactive bowel sounds. No hepatosplenomegaly. Abdominal aorta is normal size without bruit Extremities: No edema, no clubbing, no cyanosis, no ulcers,  Peripheral: 2+ radial, 2+ femoral, 2+ dorsal pedal pulses Neuro: Alert and oriented. Moves all extremities spontaneously. Psych:  Responds to questions appropriately with a normal affect.  No intake or output data in the 24 hours ending 05/18/21 1316   Inpatient Medications:   [MAR Hold] amiodarone  200 mg Oral Daily   [MAR Hold] clopidogrel  75 mg Oral Daily   [MAR Hold] dorzolamide-timolol  1 drop Both Eyes BID   [MAR Hold] DULoxetine  60 mg Oral Daily   [MAR Hold] furosemide  40 mg Intravenous Daily   [MAR Hold] insulin aspart  0-5 Units Subcutaneous QHS   [MAR Hold] insulin aspart  0-9 Units Subcutaneous TID WC   [MAR Hold] insulin glargine  10 Units Subcutaneous Daily   [MAR Hold] mirtazapine  7.5 mg Oral QHS   [MAR Hold] pantoprazole  20 mg Oral Daily   [MAR Hold] rosuvastatin  40 mg Oral q morning   [MAR Hold] sodium chloride flush  3 mL Intravenous Q12H   Infusions:   sodium chloride     sodium chloride 1 mL/kg/hr (05/18/21 1140)    Labs: Recent Labs    05/17/21 0812 05/18/21 0530  NA 136 135  K 3.2* 3.9  CL 98 98  CO2 31 32  GLUCOSE 218* 259*  BUN 30* 23  CREATININE 1.10* 1.19*  CALCIUM 8.4* 8.6*  MG 1.9 1.9    No results for input(s): AST, ALT, ALKPHOS, BILITOT, PROT, ALBUMIN in the  last 72 hours.  Recent Labs    05/16/21 0540 05/18/21 0530  WBC 8.9 8.0  HGB 11.1* 12.4  HCT 33.4* 37.4  MCV 88.4 88.8  PLT 311 304    No results for input(s): CKTOTAL, CKMB, TROPONINI in the last 72  hours. Invalid input(s): POCBNP No results for input(s): HGBA1C in the last 72 hours.   Weights: Filed Weights   05/15/21 0617 05/17/21 0417  Weight: 49.4 kg 50.8 kg     Radiology/Studies:  DG Chest 1 View  Result Date: 05/15/2021 CLINICAL DATA:  70 year old female with shortness of breath and respiratory distress. EXAM: CHEST  1 VIEW COMPARISON:  Chest radiographs 02/15/2006. FINDINGS: Portable AP upright view at 0634 hours. Similar lung volumes, and visible mediastinal contours appear normal. But there is diffuse increased pulmonary interstitial opacity, patchy bilateral perihilar opacity and confluent bibasilar opacity which most resembles pleural effusions. No pneumothorax or air bronchograms. Visualized tracheal air column is within normal limits. Paucity of bowel gas in the upper abdomen. No acute osseous abnormality identified. IMPRESSION: Abnormal lungs most suggestive of acute pulmonary edema with bilateral pleural effusions and atelectasis. Bilateral infection with pleural effusions felt less likely. Electronically Signed   By: Odessa Fleming M.D.   On: 05/15/2021 07:06   ECHOCARDIOGRAM COMPLETE  Result Date: 05/15/2021    ECHOCARDIOGRAM REPORT   Patient Name:   Kylie Thomas Date of Exam: 05/15/2021 Medical Rec #:  [id number]      Height:       60.0 in Accession #:    [id number]     Weight:       109.0 lb Date of Birth:  02/16/1951      BSA:          1.442 m Patient Age:    70 years       BP:           144/106 mmHg Patient Gender: F              HR:           55 bpm. Exam Location:  [facility] Procedure: 2D Echo Indications:     Systolic CHF  History:         Patient has no prior history of Echocardiogram examinations.                  Previous Myocardial Infarction; Risk Factors:Hypertension.  Sonographer:     L Thornton-Maynard Referring Phys:  [id number] Brien Few NIU Diagnosing Phys: Julien Nordmann MD IMPRESSIONS  1. Select images concerning for small inferior wall thrombus.  2. Left ventricular ejection  fraction, by estimation, is 40 to 45%. The left ventricle has mildly decreased function. The left ventricle demonstrates regional wall motion abnormalities (inferior wall hypokinesis). There is mild left ventricular hypertrophy. Left ventricular diastolic parameters are consistent with Grade II diastolic dysfunction (pseudonormalization).  3. Right ventricular systolic function is normal. The right ventricular size is normal. There is moderately elevated pulmonary artery systolic pressure. The estimated right ventricular systolic pressure is 49.7 mmHg.  4. Left atrial size was mildly dilated.  5. The mitral valve is normal in structure, though not well visualized. Severe mitral valve regurgitation.  6. Tricuspid valve regurgitation is moderate.  7. The aortic valve is normal in structure. Aortic valve regurgitation is mild.  8. The inferior vena cava is dilated in size with >50% respiratory variability, suggesting right atrial pressure of 8 mmHg. FINDINGS  Left Ventricle: Left ventricular ejection fraction, by estimation, is 40  to 45%. The left ventricle has mildly decreased function. The left ventricle demonstrates regional wall motion abnormalities. The left ventricular internal cavity size was normal in size. There is mild left ventricular hypertrophy. Left ventricular diastolic parameters are consistent with Grade II diastolic dysfunction (pseudonormalization). Right Ventricle: The right ventricular size is normal. No increase in right ventricular wall thickness. Right ventricular systolic function is normal. There is moderately elevated pulmonary artery systolic pressure. The tricuspid regurgitant velocity is 3.15 m/s, and with an assumed right atrial pressure of 10 mmHg, the estimated right ventricular systolic pressure is 49.7 mmHg. Left Atrium: Left atrial size was mildly dilated. Right Atrium: Right atrial size was normal in size. Pericardium: There is no evidence of pericardial effusion. Mitral Valve: The  mitral valve was not well visualized. Severe mitral valve regurgitation. No evidence of mitral valve stenosis. MV peak gradient, 9.7 mmHg. The mean mitral valve gradient is 3.0 mmHg. Tricuspid Valve: The tricuspid valve is normal in structure. Tricuspid valve regurgitation is moderate . No evidence of tricuspid stenosis. Aortic Valve: The aortic valve is normal in structure. Aortic valve regurgitation is mild. Aortic regurgitation PHT measures 719 msec. No aortic stenosis is present. Aortic valve mean gradient measures 3.0 mmHg. Aortic valve peak gradient measures 5.5 mmHg. Aortic valve area, by VTI measures 1.72 cm. Pulmonic Valve: The pulmonic valve was normal in structure. Pulmonic valve regurgitation is not visualized. No evidence of pulmonic stenosis. Aorta: The aortic root is normal in size and structure. Venous: The inferior vena cava is dilated in size with greater than 50% respiratory variability, suggesting right atrial pressure of 8 mmHg. IAS/Shunts: No atrial level shunt detected by color flow Doppler.  LEFT VENTRICLE PLAX 2D LVIDd:         3.70 cm     Diastology LVIDs:         2.50 cm     LV e' medial:    3.08 cm/s LV PW:         1.00 cm     LV E/e' medial:  41.2 LV IVS:        1.30 cm     LV e' lateral:   4.65 cm/s LVOT diam:     1.80 cm     LV E/e' lateral: 27.3 LV SV:         49 LV SV Index:   34 LVOT Area:     2.54 cm  LV Volumes (MOD) LV vol d, MOD A2C: 68.4 ml LV vol d, MOD A4C: 68.6 ml LV vol s, MOD A2C: 32.9 ml LV vol s, MOD A4C: 31.5 ml LV SV MOD A2C:     35.5 ml LV SV MOD A4C:     68.6 ml LV SV MOD BP:      37.2 ml RIGHT VENTRICLE RV S prime:     11.40 cm/s TAPSE (M-mode): 2.6 cm LEFT ATRIUM             Index       RIGHT ATRIUM          Index LA diam:        4.40 cm 3.05 cm/m  RA Area:     9.65 cm LA Vol (A2C):   50.2 ml 34.81 ml/m RA Volume:   19.80 ml 13.73 ml/m LA Vol (A4C):   54.5 ml 37.79 ml/m LA Biplane Vol: 54.4 ml 37.72 ml/m  AORTIC VALVE  PULMONIC VALVE AV  Area (Vmax):    1.76 cm    PV Vmax:       0.90 m/s AV Area (Vmean):   1.80 cm    PV Peak grad:  3.2 mmHg AV Area (VTI):     1.72 cm AV Vmax:           117.00 cm/s AV Vmean:          86.300 cm/s AV VTI:            0.284 m AV Peak Grad:      5.5 mmHg AV Mean Grad:      3.0 mmHg LVOT Vmax:         80.90 cm/s LVOT Vmean:        61.100 cm/s LVOT VTI:          0.192 m LVOT/AV VTI ratio: 0.68 AI PHT:            719 msec  AORTA Ao Root diam: 2.80 cm MITRAL VALVE                TRICUSPID VALVE MV Area (PHT): 3.06 cm     TR Peak grad:   39.7 mmHg MV Area VTI:   0.92 cm     TR Vmax:        315.00 cm/s MV Peak grad:  9.7 mmHg MV Mean grad:  3.0 mmHg     SHUNTS MV Vmax:       1.56 m/s     Systemic VTI:  0.19 m MV Vmean:      73.0 cm/s    Systemic Diam: 1.80 cm MV E velocity: 127.00 cm/s MV A velocity: 93.80 cm/s MV E/A ratio:  1.35 Julien Nordmannimothy Gollan MD Electronically signed by Julien Nordmannimothy Gollan MD Signature Date/Time: 05/15/2021/3:45:18 PM    Final      Assessment and Recommendation  70 y.o. female with paroxysmal nonvalvular atrial fibrillation known coronary artery disease with acute on chronic systolic dysfunction congestive heart failure and unstable angina slightly improved on better medication management at this time without evidence of myocardial infarction.  Cardiac catheterization with significant three-vessel coronary artery disease and not abundantly clear as to appropriate intervention at this time with need for medical management and further discussion 1.  Continuation of amiodarone for further risk reduction in potential for atrial fibrillation at current dose without change 2.  Continue furosemide for acute on chronic systolic dysfunction congestive heart failure and pulmonary edema and change to oral dosage at this time 3.   Reinstatement of Eliquis at 5 mg twice per day for further risk reduction stroke with atrial fibrillation upon discharge and will further reassess need for long-term treatment  thereafter 4.  High intensity cholesterol therapy for coronary artery disease and previous myocardial infarction 5.  Begin ambulation and follow-up for improvements of symptoms and if ambulating well without any further significant cardiovascular symptoms okay for discharged home with follow-up and further adjustments of medication management and discussion of further potential intervention from the cardiovascular standpoint as outpatient  Signed, Arnoldo HookerBruce Florella Mcneese M.D. FACC

## 2021-05-18 NOTE — Progress Notes (Addendum)
Progress Note    HAILEI BESSER  QJF:[id number] DOB: June 19, 1951  DOA: 05/15/2021 PCP: Estell Harpin, MD      Brief Narrative:    Medical records reviewed and are as summarized below:  JENEANE PIECZYNSKI is a 70 y.o. female with medical history of HTN, HLD, type II DM, GERD, depression chronic A. fib, CKD 3B, CAD with recent STEMI s/p PCI to circumflex, HFrEF with EF 45%.      Assessment/Plan:   Principal Problem:   Acute on chronic systolic CHF (congestive heart failure) (HCC) Active Problems:   Acute respiratory failure with hypoxia (HCC)   HTN (hypertension)   HLD (hyperlipidemia)   Type II diabetes mellitus with renal manifestations (HCC)   Depression with anxiety   CKD (chronic kidney disease), stage IIIa   CAD (coronary artery disease)   Atrial fibrillation, chronic (HCC)   Hypothermia   Sepsis (HCC)   Elevated troponin   UTI (urinary tract infection)    Body mass index is 21.85 kg/m.  Acute on chronic systolic CHF/flash pulmonary edema: Change IV Lasix to oral Lasix.  Monitor BMP, daily weight and urine output.  Acute on chronic hypoxic respiratory failure: She said she uses oxygen only as needed at home.  Elevated troponin, CAD with recent posterior STEMI in May 2022: Cardiac cath showed circumflex and LAD disease.  Stent was placed to the circumflex.  He was on Plavix and Eliquis prior to admission. S/p left heart cath today with the following findings:   Prox RCA lesion is 95% stenosed. Dist Cx lesion is 100% stenosed. Prox LAD to Mid LAD lesion is 70% stenosed. Prox Cx to Mid Cx lesion is 15% stenosed. 1st Diag-1 lesion is 85% stenosed. 1st Diag-2 lesion is 70% stenosed.   No stent was placed.  Cardiologist recommended medical management for now.  Decision to be made whether patient will need CABG or not.  Chronic atrial fibrillation: Resume Eliquis  Type II DM with hyperglycemia: Treat with Lantus and NovoLog.  Pioglitazone on hold Trulicity  have been held.  Other comorbidities include depression, anxiety, CKD stage IIIa, hypertension, hyperlipidemia   Diet Order             Diet 2 gram sodium Room service appropriate? Yes; Fluid consistency: Thin  Diet effective now           Diet - low sodium heart healthy           Diet Carb Modified                      Consultants: Cardiologist  Procedures: Left heart cath    Medications:    amiodarone  200 mg Oral Daily   clopidogrel  75 mg Oral Daily   dorzolamide-timolol  1 drop Both Eyes BID   DULoxetine  60 mg Oral Daily   insulin aspart  0-5 Units Subcutaneous QHS   insulin aspart  0-9 Units Subcutaneous TID WC   insulin glargine  10 Units Subcutaneous Daily   isosorbide mononitrate  30 mg Oral Daily   mirtazapine  7.5 mg Oral QHS   pantoprazole  20 mg Oral Daily   rosuvastatin  40 mg Oral q morning   sodium chloride flush  3 mL Intravenous Q12H   Continuous Infusions:   Anti-infectives (From admission, onward)    Start     Dose/Rate Route Frequency Ordered Stop   05/15/21 1415  cefTRIAXone (ROCEPHIN) 1 g in sodium chloride 0.9 %  100 mL IVPB  Status:  Discontinued        1 g 200 mL/hr over 30 Minutes Intravenous Every 24 hours 05/15/21 1414 05/16/21 1050              Family Communication/Anticipated D/C date and plan/Code Status   DVT prophylaxis:      Code Status: Full Code  Family Communication: None Disposition Plan:    Status is: Inpatient  Remains inpatient appropriate because:Inpatient level of care appropriate due to severity of illness  Dispo: The patient is from: Home              Anticipated d/c is to: Home              Patient currently is not medically stable to d/c.   Difficult to place patient No           Subjective:   Interval events noted.  She feels a little better today.  However, she does not feel ready to go home.  She will is to stay 1 more night.  She said she was previously taking 60 mg of  Lasix twice a day and this was decreased to 40 mg twice a day just a few days before admission.  She said her Lasix had been decreased because of AKI/dehydration.  Objective:    Vitals:   05/18/21 1330 05/18/21 1345 05/18/21 1400 05/18/21 1443  BP: (!) 106/57 (!) 112/57 (!) 114/58 118/62  Pulse: 63 62 63 65  Resp: 19 18 20 18   Temp:    98.1 F (36.7 C)  TempSrc:      SpO2: 95% 97% 97% 99%  Weight:      Height:       No data found.  No intake or output data in the 24 hours ending 05/18/21 1739 Filed Weights   05/15/21 0617 05/17/21 0417  Weight: 49.4 kg 50.8 kg    Exam:  GEN: NAD SKIN: No rash EYES: EOMI ENT: MMM CV: RRR PULM: CTA B ABD: soft, ND, NT, +BS CNS: AAO x 3, non focal EXT: No edema or tenderness        Data Reviewed:   I have personally reviewed following labs and imaging studies:  Labs: Labs show the following:   Basic Metabolic Panel: Recent Labs  Lab 05/15/21 0617 05/16/21 0540 05/17/21 0812 05/18/21 0530  NA 132* 138 136 135  K 3.8 3.5 3.2* 3.9  CL 96* 99 98 98  CO2 20* 33* 31 32  GLUCOSE 378* 135* 218* 259*  BUN 42* 35* 30* 23  CREATININE 1.70* 1.63* 1.10* 1.19*  CALCIUM 8.5* 8.5* 8.4* 8.6*  MG  --  2.0 1.9 1.9   GFR Estimated Creatinine Clearance: 31.6 mL/min (A) (by C-G formula based on SCr of 1.19 mg/dL (H)). Liver Function Tests: Recent Labs  Lab 05/15/21 0617  AST 22  ALT 16  ALKPHOS 104  BILITOT 0.5  PROT 7.2  ALBUMIN 3.3*   No results for input(s): LIPASE, AMYLASE in the last 168 hours. No results for input(s): AMMONIA in the last 168 hours. Coagulation profile Recent Labs  Lab 05/15/21 0628  INR 1.3*    CBC: Recent Labs  Lab 05/15/21 0617 05/16/21 0540 05/18/21 0530  WBC 15.4* 8.9 8.0  HGB 12.9 11.1* 12.4  HCT 39.3 33.4* 37.4  MCV 91.6 88.4 88.8  PLT 408* 311 304   Cardiac Enzymes: No results for input(s): CKTOTAL, CKMB, CKMBINDEX, TROPONINI in the last 168 hours. BNP (last 3  results) No  results for input(s): PROBNP in the last 8760 hours. CBG: Recent Labs  Lab 05/17/21 2309 05/18/21 0641 05/18/21 1127 05/18/21 1318 05/18/21 1631  GLUCAP 235* 263* 218* 210* 286*   D-Dimer: No results for input(s): DDIMER in the last 72 hours. Hgb A1c: No results for input(s): HGBA1C in the last 72 hours. Lipid Profile: Recent Labs    05/16/21 0540  CHOL 95  HDL 40*  LDLCALC 45  TRIG 52  CHOLHDL 2.4   Thyroid function studies: No results for input(s): TSH, T4TOTAL, T3FREE, THYROIDAB in the last 72 hours.  Invalid input(s): FREET3 Anemia work up: No results for input(s): VITAMINB12, FOLATE, FERRITIN, TIBC, IRON, RETICCTPCT in the last 72 hours. Sepsis Labs: Recent Labs  Lab 05/15/21 0617 05/15/21 0628 05/15/21 0942 05/16/21 0540 05/18/21 0530  PROCALCITON  --  <0.10 <0.10  --   --   WBC 15.4*  --   --  8.9 8.0  LATICACIDVEN  --   --  1.4  --   --     Microbiology Recent Results (from the past 240 hour(s))  Resp Panel by RT-PCR (Flu A&B, Covid) Nasopharyngeal Swab     Status: None   Collection Time: 05/15/21  7:56 AM   Specimen: Nasopharyngeal Swab; Nasopharyngeal(NP) swabs in vial transport medium  Result Value Ref Range Status   SARS Coronavirus 2 by RT PCR NEGATIVE NEGATIVE Final    Comment: (NOTE) SARS-CoV-2 target nucleic acids are NOT DETECTED.  The SARS-CoV-2 RNA is generally detectable in upper respiratory specimens during the acute phase of infection. The lowest concentration of SARS-CoV-2 viral copies this assay can detect is 138 copies/mL. A negative result does not preclude SARS-Cov-2 infection and should not be used as the sole basis for treatment or other patient management decisions. A negative result may occur with  improper specimen collection/handling, submission of specimen other than nasopharyngeal swab, presence of viral mutation(s) within the areas targeted by this assay, and inadequate number of viral copies(<138 copies/mL). A  negative result must be combined with clinical observations, patient history, and epidemiological information. The expected result is Negative.  Fact Sheet for Patients:  BloggerCourse.comhttps://www.fda.gov/media/152166/download  Fact Sheet for Healthcare Providers:  SeriousBroker.ithttps://www.fda.gov/media/152162/download  This test is no t yet approved or cleared by the [place] FDA and  has been authorized for detection and/or diagnosis of SARS-CoV-2 by FDA under an Emergency Use Authorization (EUA). This EUA will remain  in effect (meaning this test can be used) for the duration of the COVID-19 declaration under Section 564(b)(1) of the Act, 21 U.S.C.section 360bbb-3(b)(1), unless the authorization is terminated  or revoked sooner.       Influenza A by PCR NEGATIVE NEGATIVE Final   Influenza B by PCR NEGATIVE NEGATIVE Final    Comment: (NOTE) The Xpert Xpress SARS-CoV-2/FLU/RSV plus assay is intended as an aid in the diagnosis of influenza from Nasopharyngeal swab specimens and should not be used as a sole basis for treatment. Nasal washings and aspirates are unacceptable for Xpert Xpress SARS-CoV-2/FLU/RSV testing.  Fact Sheet for Patients: BloggerCourse.comhttps://www.fda.gov/media/152166/download  Fact Sheet for Healthcare Providers: SeriousBroker.ithttps://www.fda.gov/media/152162/download  This test is not yet approved or cleared by the [place] FDA and has been authorized for detection and/or diagnosis of SARS-CoV-2 by FDA under an Emergency Use Authorization (EUA). This EUA will remain in effect (meaning this test can be used) for the duration of the COVID-19 declaration under Section 564(b)(1) of the Act, 21 U.S.C. section 360bbb-3(b)(1), unless the authorization is terminated or revoked.  Performed at [facility], [address] [place], [place] [address]   Culture, blood (x 2)     Status: None (Preliminary result)   Collection Time: 05/15/21  9:42 AM   Specimen: BLOOD  Result Value Ref Range  Status   Specimen Description BLOOD BLOOD RIGHT ARM  Final   Special Requests   Final    BOTTLES DRAWN AEROBIC AND ANAEROBIC Blood Culture adequate volume   Culture   Final    NO GROWTH 3 DAYS Performed at [facility], [address], [place], [place] [address]    Report Status PENDING  Incomplete  Urine Culture     Status: Abnormal   Collection Time: 05/15/21  9:42 AM   Specimen: Urine, Random  Result Value Ref Range Status   Specimen Description   Final    URINE, RANDOM Performed at [facility], [address], [place], [place] [address]    Special Requests   Final    NONE Performed at [facility], [address] [place], [place] [address]    Culture >=100,000 COLONIES/mL ENTEROCOCCUS FAECALIS (A)  Final   Report Status 05/18/2021 FINAL  Final   Organism ID, Bacteria ENTEROCOCCUS FAECALIS (A)  Final      Susceptibility   Enterococcus faecalis - MIC*    AMPICILLIN <=2 SENSITIVE Sensitive     NITROFURANTOIN <=16 SENSITIVE Sensitive     VANCOMYCIN 1 SENSITIVE Sensitive     * >=100,000 COLONIES/mL ENTEROCOCCUS FAECALIS  Culture, blood (x 2)     Status: None (Preliminary result)   Collection Time: 05/15/21 12:31 PM   Specimen: BLOOD  Result Value Ref Range Status   Specimen Description BLOOD RIGHT ANTECUBITAL  Final   Special Requests   Final    BOTTLES DRAWN AEROBIC AND ANAEROBIC Blood Culture adequate volume   Culture   Final    NO GROWTH 3 DAYS Performed at [facility], [address], [place], [place] [address]    Report Status PENDING  Incomplete    Procedures and diagnostic studies:  CARDIAC CATHETERIZATION  Result Date: 05/18/2021  Prox RCA lesion is 95% stenosed.  Dist Cx lesion is 100% stenosed.  Prox LAD to Mid LAD lesion is 70% stenosed.  Prox Cx to Mid Cx lesion is 15% stenosed.  1st Diag-1 lesion is 85% stenosed.  1st Diag-2 lesion is 70% stenosed.  70 year old female with hypertension hyperlipidemia and  diabetes who has had ST elevation myocardial infarction several weeks prior status post PCI and stent placement of circumflex artery successfully.  The patient additionally had chronically occluded distal left PDA and chronically occluded and/or significantly stenosed right coronary artery proximal that appears to be too small to further intervene upon.  The patient was theoretically going to be staged for a LAD stenosis intervention 70% proximal LAD although when cardiac catheterization today has more cardiovascular issues with a significantly diffusely diseased 85 and 70% serial lesions of a diagonal artery which is large.  After further discussion with cardiovascular intervention it is appropriate to further medically manage and discuss more closely with cardiovascular surgery and intervention before making further decisions Plan Maximal medical management for anginal symptoms Single antiplatelet therapy due to anticoagulation for atrial fibrillation High intensity cholesterol therapy Cardiac rehabilitation               LOS: 3 days   Gisel Vipond  [facility]   Pager on [email or web address]. If 7PM-7AM, please contact night-coverage at [email or web address]  05/18/2021, 5:39 PM

## 2021-05-19 ENCOUNTER — Encounter: Payer: Self-pay | Admitting: Internal Medicine

## 2021-05-19 LAB — BASIC METABOLIC PANEL
Anion gap: 10 (ref 5–15)
BUN: 24 mg/dL — ABNORMAL HIGH (ref 8–23)
CO2: 27 mmol/L (ref 22–32)
Calcium: 8.6 mg/dL — ABNORMAL LOW (ref 8.9–10.3)
Chloride: 97 mmol/L — ABNORMAL LOW (ref 98–111)
Creatinine, Ser: 1.2 mg/dL — ABNORMAL HIGH (ref 0.44–1.00)
GFR, Estimated: 49 mL/min — ABNORMAL LOW (ref >60)
Glucose, Bld: 239 mg/dL — ABNORMAL HIGH (ref 70–99)
Potassium: 3.9 mmol/L (ref 3.5–5.1)
Sodium: 134 mmol/L — ABNORMAL LOW (ref 135–145)

## 2021-05-19 LAB — GLUCOSE, CAPILLARY: Glucose-Capillary: 242 mg/dL — ABNORMAL HIGH (ref 70–99)

## 2021-05-19 NOTE — Progress Notes (Signed)
Inpatient Diabetes Program Recommendations  AACE/ADA: New Consensus Statement on Inpatient Glycemic Control (2015)  Target Ranges:  Prepandial:   less than 140 mg/dL      Peak postprandial:   less than 180 mg/dL (1-2 hours)      Critically ill patients:  140 - 180 mg/dL   Lab Results  Component Value Date   GLUCAP 242 (H) 05/19/2021    Review of Glycemic Control  Inpatient Diabetes Program Recommendations:   Patient had hyperglycemia 6/22. Patient did not receive Lantus when returned from cath lab. Will follow.  Thank you, Billy Fischer. Salim Forero, RN, MSN, CDE  Diabetes Coordinator Inpatient Glycemic Control Team Team Pager [phone] (8am-5pm) 05/19/2021 9:43 AM

## 2021-05-19 NOTE — Discharge Summary (Signed)
Physician Discharge Summary  Kylie Thomas:[id number] DOB: January 28, 1951 DOA: 05/15/2021  PCP: Estell Harpin, MD  Admit date: 05/15/2021 Discharge date: 05/19/2021  Discharge disposition: Home   Recommendations for Outpatient Follow-Up:   Follow-up with PCP as scheduled today on 05/19/2021. Follow-up with cardiologist within 1 week of discharge.   Discharge Diagnosis:   Principal Problem:   Acute on chronic systolic CHF (congestive heart failure) (HCC) Active Problems:   Acute respiratory failure with hypoxia (HCC)   HTN (hypertension)   HLD (hyperlipidemia)   Type II diabetes mellitus with renal manifestations (HCC)   Depression with anxiety   CKD (chronic kidney disease), stage IIIa   CAD (coronary artery disease)   Atrial fibrillation, chronic (HCC)   Hypothermia   Sepsis (HCC)   Elevated troponin   UTI (urinary tract infection)    Discharge Condition: Stable.  Diet recommendation:  Diet Order             Diet Carb Modified Fluid consistency: Thin; Room service appropriate? Yes  Diet effective now           Diet - low sodium heart healthy           Diet Carb Modified                     Code Status: Full Code     Hospital Course:   Ms. Kylie Thomas is a 70 y.o. female with medical history of HTN, HLD, type II DM, GERD, depression chronic A. fib, CKD 3B, CAD with recent STEMI s/p PCI to circumflex, HFrEF with EF 45%, who presented to the hospital with shortness of breath.  She was recently hospitalized in May 2022 for posterior STEMI and she underwent PCI and stent was placed to the left circumflex.  She said she was discharged on Lasix and Eliquis for CHF and atrial fibrillation respectively.  She was previously taking Lasix 60 mg twice daily.  However, she was told by her cardiology team Derwood Kaplan, NP) on 05/12/2021, to decrease Lasix from 60 mg twice daily to 40 mg twice daily because of AKI/dehydration.  About 3 days later, she developed  increasing shortness of breath and hypoxia with oxygen saturation in the 80s even while on her home oxygen.  She was admitted to the hospital for acute on chronic systolic CHF and acute on chronic hypoxic respiratory failure.  She was treated with IV Lasix and oxygen via nasal cannula.  Cardiologist was consulted because of elevated troponin and acute CHF.  Elevated troponin was thought to be due to demand ischemia.  She underwent left heart cath and findings are as follows:   Prox RCA lesion is 95% stenosed. Dist Cx lesion is 100% stenosed. Prox LAD to Mid LAD lesion is 70% stenosed. Prox Cx to Mid Cx lesion is 15% stenosed. 1st Diag-1 lesion is 85% stenosed. 1st Diag-2 lesion is 70% stenosed.   Her condition has improved significantly and she is tolerating room air.  She is deemed stable for discharge to home today.  She prefers to go back to taking Lasix 60 mg twice daily because of recent CHF exacerbation after decreasing her Lasix to 40 mg twice daily.  She has been advised to follow-up with PCP/cardiologist closely to monitor kidney function   Medical Consultants:   Cardiologist   Discharge Exam:    Vitals:   05/18/21 1847 05/19/21 0353 05/19/21 0356 05/19/21 0735  BP: (!) 101/48 109/63  118/62  Pulse: 72  68  72  Resp: Temp: 98.4 F (36.9 C) 98.2 F (36.8 C)  98.3 F (36.8 C)  TempSrc: Oral Oral  Oral  SpO2: 92% 94%  93%  Weight:   52.2 kg   Height:         GEN: NAD SKIN: Warm and dry EYES: EOMI ENT: MMM CV: RRR PULM: CTA B ABD: soft, ND, NT, +BS CNS: AAO x 3, non focal EXT: No edema or tenderness   The results of significant diagnostics from this hospitalization (including imaging, microbiology, ancillary and laboratory) are listed below for reference.     Procedures and Diagnostic Studies:   DG Chest 1 View  Result Date: 05/15/2021 CLINICAL DATA:  70 year old female with shortness of breath and respiratory distress. EXAM: CHEST  1 VIEW  COMPARISON:  Chest radiographs 02/15/2006. FINDINGS: Portable AP upright view at 0634 hours. Similar lung volumes, and visible mediastinal contours appear normal. But there is diffuse increased pulmonary interstitial opacity, patchy bilateral perihilar opacity and confluent bibasilar opacity which most resembles pleural effusions. No pneumothorax or air bronchograms. Visualized tracheal air column is within normal limits. Paucity of bowel gas in the upper abdomen. No acute osseous abnormality identified. IMPRESSION: Abnormal lungs most suggestive of acute pulmonary edema with bilateral pleural effusions and atelectasis. Bilateral infection with pleural effusions felt less likely. Electronically Signed   By: Odessa Fleming M.D.   On: 05/15/2021 07:06   ECHOCARDIOGRAM COMPLETE  Result Date: 05/15/2021    ECHOCARDIOGRAM REPORT   Patient Name:   Kylie Thomas Date of Exam: 05/15/2021 Medical Rec #:  [id number]      Height:       60.0 in Accession #:    [id number]     Weight:       109.0 lb Date of Birth:  08/17/1951      BSA:          1.442 m Patient Age:    69 years       BP:           144/106 mmHg Patient Gender: F              HR:           55 bpm. Exam Location:  [facility] Procedure: 2D Echo Indications:     Systolic CHF  History:         Patient has no prior history of Echocardiogram examinations.                  Previous Myocardial Infarction; Risk Factors:Hypertension.  Sonographer:     L Thornton-Maynard Referring Phys:  [id number] Brien Few NIU Diagnosing Phys: Julien Nordmann MD IMPRESSIONS  1. Select images concerning for small inferior wall thrombus.  2. Left ventricular ejection fraction, by estimation, is 40 to 45%. The left ventricle has mildly decreased function. The left ventricle demonstrates regional wall motion abnormalities (inferior wall hypokinesis). There is mild left ventricular hypertrophy. Left ventricular diastolic parameters are consistent with Grade II diastolic dysfunction (pseudonormalization).  3. Right  ventricular systolic function is normal. The right ventricular size is normal. There is moderately elevated pulmonary artery systolic pressure. The estimated right ventricular systolic pressure is 49.7 mmHg.  4. Left atrial size was mildly dilated.  5. The mitral valve is normal in structure, though not well visualized. Severe mitral valve regurgitation.  6. Tricuspid valve regurgitation is moderate.  7. The aortic valve is normal in structure. Aortic valve regurgitation is mild.  8.  The inferior vena cava is dilated in size with >50% respiratory variability, suggesting right atrial pressure of 8 mmHg. FINDINGS  Left Ventricle: Left ventricular ejection fraction, by estimation, is 40 to 45%. The left ventricle has mildly decreased function. The left ventricle demonstrates regional wall motion abnormalities. The left ventricular internal cavity size was normal in size. There is mild left ventricular hypertrophy. Left ventricular diastolic parameters are consistent with Grade II diastolic dysfunction (pseudonormalization). Right Ventricle: The right ventricular size is normal. No increase in right ventricular wall thickness. Right ventricular systolic function is normal. There is moderately elevated pulmonary artery systolic pressure. The tricuspid regurgitant velocity is 3.15 m/s, and with an assumed right atrial pressure of 10 mmHg, the estimated right ventricular systolic pressure is 49.7 mmHg. Left Atrium: Left atrial size was mildly dilated. Right Atrium: Right atrial size was normal in size. Pericardium: There is no evidence of pericardial effusion. Mitral Valve: The mitral valve was not well visualized. Severe mitral valve regurgitation. No evidence of mitral valve stenosis. MV peak gradient, 9.7 mmHg. The mean mitral valve gradient is 3.0 mmHg. Tricuspid Valve: The tricuspid valve is normal in structure. Tricuspid valve regurgitation is moderate . No evidence of tricuspid stenosis. Aortic Valve: The aortic  valve is normal in structure. Aortic valve regurgitation is mild. Aortic regurgitation PHT measures 719 msec. No aortic stenosis is present. Aortic valve mean gradient measures 3.0 mmHg. Aortic valve peak gradient measures 5.5 mmHg. Aortic valve area, by VTI measures 1.72 cm. Pulmonic Valve: The pulmonic valve was normal in structure. Pulmonic valve regurgitation is not visualized. No evidence of pulmonic stenosis. Aorta: The aortic root is normal in size and structure. Venous: The inferior vena cava is dilated in size with greater than 50% respiratory variability, suggesting right atrial pressure of 8 mmHg. IAS/Shunts: No atrial level shunt detected by color flow Doppler.  LEFT VENTRICLE PLAX 2D LVIDd:         3.70 cm     Diastology LVIDs:         2.50 cm     LV e' medial:    3.08 cm/s LV PW:         1.00 cm     LV E/e' medial:  41.2 LV IVS:        1.30 cm     LV e' lateral:   4.65 cm/s LVOT diam:     1.80 cm     LV E/e' lateral: 27.3 LV SV:         49 LV SV Index:   34 LVOT Area:     2.54 cm  LV Volumes (MOD) LV vol d, MOD A2C: 68.4 ml LV vol d, MOD A4C: 68.6 ml LV vol s, MOD A2C: 32.9 ml LV vol s, MOD A4C: 31.5 ml LV SV MOD A2C:     35.5 ml LV SV MOD A4C:     68.6 ml LV SV MOD BP:      37.2 ml RIGHT VENTRICLE RV S prime:     11.40 cm/s TAPSE (M-mode): 2.6 cm LEFT ATRIUM             Index       RIGHT ATRIUM          Index LA diam:        4.40 cm 3.05 cm/m  RA Area:     9.65 cm LA Vol (A2C):   50.2 ml 34.81 ml/m RA Volume:   19.80 ml 13.73 ml/m LA Vol (A4C):  54.5 ml 37.79 ml/m LA Biplane Vol: 54.4 ml 37.72 ml/m  AORTIC VALVE                   PULMONIC VALVE AV Area (Vmax):    1.76 cm    PV Vmax:       0.90 m/s AV Area (Vmean):   1.80 cm    PV Peak grad:  3.2 mmHg AV Area (VTI):     1.72 cm AV Vmax:           117.00 cm/s AV Vmean:          86.300 cm/s AV VTI:            0.284 m AV Peak Grad:      5.5 mmHg AV Mean Grad:      3.0 mmHg LVOT Vmax:         80.90 cm/s LVOT Vmean:        61.100 cm/s LVOT VTI:           0.192 m LVOT/AV VTI ratio: 0.68 AI PHT:            719 msec  AORTA Ao Root diam: 2.80 cm MITRAL VALVE                TRICUSPID VALVE MV Area (PHT): 3.06 cm     TR Peak grad:   39.7 mmHg MV Area VTI:   0.92 cm     TR Vmax:        315.00 cm/s MV Peak grad:  9.7 mmHg MV Mean grad:  3.0 mmHg     SHUNTS MV Vmax:       1.56 m/s     Systemic VTI:  0.19 m MV Vmean:      73.0 cm/s    Systemic Diam: 1.80 cm MV E velocity: 127.00 cm/s MV A velocity: 93.80 cm/s MV E/A ratio:  1.35 Julien Nordmann MD Electronically signed by Julien Nordmann MD Signature Date/Time: 05/15/2021/3:45:18 PM    Final      Labs:   Basic Metabolic Panel: Recent Labs  Lab 05/15/21 0617 05/16/21 0540 05/17/21 0812 05/18/21 0530 05/18/21 2053 05/19/21 0751  NA 132* 138 136 135  --  134*  K 3.8 3.5 3.2* 3.9  --  3.9  CL 96* 99 98 98  --  97*  CO2 20* 33* 31 32  --  27  GLUCOSE 378* 135* 218* 259* 488* 239*  BUN 42* 35* 30* 23  --  24*  CREATININE 1.70* 1.63* 1.10* 1.19*  --  1.20*  CALCIUM 8.5* 8.5* 8.4* 8.6*  --  8.6*  MG  --  2.0 1.9 1.9  --   --    GFR Estimated Creatinine Clearance: 31.3 mL/min (A) (by C-G formula based on SCr of 1.2 mg/dL (H)). Liver Function Tests: Recent Labs  Lab 05/15/21 0617  AST 22  ALT 16  ALKPHOS 104  BILITOT 0.5  PROT 7.2  ALBUMIN 3.3*   No results for input(s): LIPASE, AMYLASE in the last 168 hours. No results for input(s): AMMONIA in the last 168 hours. Coagulation profile Recent Labs  Lab 05/15/21 0628  INR 1.3*    CBC: Recent Labs  Lab 05/15/21 0617 05/16/21 0540 05/18/21 0530  WBC 15.4* 8.9 8.0  HGB 12.9 11.1* 12.4  HCT 39.3 33.4* 37.4  MCV 91.6 88.4 88.8  PLT 408* 311 304   Cardiac Enzymes: No results for input(s): CKTOTAL, CKMB, CKMBINDEX, TROPONINI in the last 168  hours. BNP: Invalid input(s): POCBNP CBG: Recent Labs  Lab 05/18/21 1127 05/18/21 1318 05/18/21 1631 05/18/21 2028 05/19/21 0736  GLUCAP 218* 210* 286* 518* 242*   D-Dimer No  results for input(s): DDIMER in the last 72 hours. Hgb A1c No results for input(s): HGBA1C in the last 72 hours. Lipid Profile No results for input(s): CHOL, HDL, LDLCALC, TRIG, CHOLHDL, LDLDIRECT in the last 72 hours. Thyroid function studies No results for input(s): TSH, T4TOTAL, T3FREE, THYROIDAB in the last 72 hours.  Invalid input(s): FREET3 Anemia work up No results for input(s): VITAMINB12, FOLATE, FERRITIN, TIBC, IRON, RETICCTPCT in the last 72 hours. Microbiology Recent Results (from the past 240 hour(s))  Resp Panel by RT-PCR (Flu A&B, Covid) Nasopharyngeal Swab     Status: None   Collection Time: 05/15/21  7:56 AM   Specimen: Nasopharyngeal Swab; Nasopharyngeal(NP) swabs in vial transport medium  Result Value Ref Range Status   SARS Coronavirus 2 by RT PCR NEGATIVE NEGATIVE Final    Comment: (NOTE) SARS-CoV-2 target nucleic acids are NOT DETECTED.  The SARS-CoV-2 RNA is generally detectable in upper respiratory specimens during the acute phase of infection. The lowest concentration of SARS-CoV-2 viral copies this assay can detect is 138 copies/mL. A negative result does not preclude SARS-Cov-2 infection and should not be used as the sole basis for treatment or other patient management decisions. A negative result may occur with  improper specimen collection/handling, submission of specimen other than nasopharyngeal swab, presence of viral mutation(s) within the areas targeted by this assay, and inadequate number of viral copies(<138 copies/mL). A negative result must be combined with clinical observations, patient history, and epidemiological information. The expected result is Negative.  Fact Sheet for Patients:  BloggerCourse.com  Fact Sheet for Healthcare Providers:  SeriousBroker.it  This test is no t yet approved or cleared by the [place] FDA and  has been authorized for detection and/or diagnosis of  SARS-CoV-2 by FDA under an Emergency Use Authorization (EUA). This EUA will remain  in effect (meaning this test can be used) for the duration of the COVID-19 declaration under Section 564(b)(1) of the Act, 21 U.S.C.section 360bbb-3(b)(1), unless the authorization is terminated  or revoked sooner.       Influenza A by PCR NEGATIVE NEGATIVE Final   Influenza B by PCR NEGATIVE NEGATIVE Final    Comment: (NOTE) The Xpert Xpress SARS-CoV-2/FLU/RSV plus assay is intended as an aid in the diagnosis of influenza from Nasopharyngeal swab specimens and should not be used as a sole basis for treatment. Nasal washings and aspirates are unacceptable for Xpert Xpress SARS-CoV-2/FLU/RSV testing.  Fact Sheet for Patients: BloggerCourse.com  Fact Sheet for Healthcare Providers: SeriousBroker.it  This test is not yet approved or cleared by the [place] FDA and has been authorized for detection and/or diagnosis of SARS-CoV-2 by FDA under an Emergency Use Authorization (EUA). This EUA will remain in effect (meaning this test can be used) for the duration of the COVID-19 declaration under Section 564(b)(1) of the Act, 21 U.S.C. section 360bbb-3(b)(1), unless the authorization is terminated or revoked.  Performed at [facility], [address] [place], [place] [address]   Culture, blood (x 2)     Status: None (Preliminary result)   Collection Time: 05/15/21  9:42 AM   Specimen: BLOOD  Result Value Ref Range Status   Specimen Description BLOOD BLOOD RIGHT ARM  Final   Special Requests   Final    BOTTLES DRAWN AEROBIC AND ANAEROBIC Blood Culture adequate  volume   Culture   Final    NO GROWTH 4 DAYS Performed at [facility], [address] [place], [place] [address]    Report Status PENDING  Incomplete  Urine Culture     Status: Abnormal   Collection Time: 05/15/21  9:42 AM   Specimen: Urine, Random  Result Value  Ref Range Status   Specimen Description   Final    URINE, RANDOM Performed at [facility], [address], [place], [place] [address]    Special Requests   Final    NONE Performed at [facility], [address] [place], [place] [address]    Culture >=100,000 COLONIES/mL ENTEROCOCCUS FAECALIS (A)  Final   Report Status 05/18/2021 FINAL  Final   Organism ID, Bacteria ENTEROCOCCUS FAECALIS (A)  Final      Susceptibility   Enterococcus faecalis - MIC*    AMPICILLIN <=2 SENSITIVE Sensitive     NITROFURANTOIN <=16 SENSITIVE Sensitive     VANCOMYCIN 1 SENSITIVE Sensitive     * >=100,000 COLONIES/mL ENTEROCOCCUS FAECALIS  Culture, blood (x 2)     Status: None (Preliminary result)   Collection Time: 05/15/21 12:31 PM   Specimen: BLOOD  Result Value Ref Range Status   Specimen Description BLOOD RIGHT ANTECUBITAL  Final   Special Requests   Final    BOTTLES DRAWN AEROBIC AND ANAEROBIC Blood Culture adequate volume   Culture   Final    NO GROWTH 4 DAYS Performed at [facility], [address], [place], [place] [address]    Report Status PENDING  Incomplete     Discharge Instructions:   Discharge Instructions     (HEART FAILURE PATIENTS) Call MD:  Anytime you have any of the following symptoms: 1) 3 pound weight gain in 24 hours or 5 pounds in 1 week 2) shortness of breath, with or without a dry hacking cough 3) swelling in the hands, feet or stomach 4) if you have to sleep on extra pillows at night in order to breathe.   Complete by: As directed    AMB Referral to Cardiac Rehabilitation - Phase II   Complete by: As directed    Diagnosis: STEMI   Call MD for:  difficulty breathing, headache or visual disturbances   Complete by: As directed    Call MD for:  persistant dizziness or light-headedness   Complete by: As directed    Call MD for:  persistant nausea and vomiting   Complete by: As directed    Call MD for:  temperature >100.4   Complete by:  As directed    Diet - low sodium heart healthy   Complete by: As directed    Diet Carb Modified   Complete by: As directed    Discharge instructions   Complete by: As directed    Follow-up with PCP 05/19/2021 as scheduled.   Heart Failure patients record your daily weight using the same scale at the same time of day   Complete by: As directed    Increase activity slowly   Complete by: As directed    STOP any activity that causes chest pain, shortness of breath, dizziness, sweating, or exessive weakness   Complete by: As directed       Allergies as of 05/19/2021       Reactions   Atorvastatin Other (See Comments)   Other reaction(s): Arthralgia (Joint Pain) Severe cramps   Remifentanil    Other reaction(s): Irregular Heart Rate  Fentanyl    Sulfa Antibiotics Nausea Only, Other (See Comments)        Medication List     STOP taking these medications    ACTOS PO   aspirin 325 MG tablet   buPROPion 150 MG 24 hr tablet Commonly known as: WELLBUTRIN XL   hyoscyamine 0.375 MG 12 hr tablet Commonly known as: LEVBID   insulin detemir 100 UNIT/ML FlexPen Commonly known as: LEVEMIR   lisinopril 10 MG tablet Commonly known as: ZESTRIL   Trulicity 1.5 MG/0.5ML Sopn Generic drug: Dulaglutide       TAKE these medications    amiodarone 200 MG tablet Commonly known as: PACERONE Take 200 mg by mouth daily.   clopidogrel 75 MG tablet Commonly known as: PLAVIX Take 75 mg by mouth daily.   dorzolamide-timolol 22.3-6.8 MG/ML ophthalmic solution Commonly known as: COSOPT Place 1 drop into both eyes 2 (two) times daily.   DULoxetine 60 MG capsule Commonly known as: CYMBALTA Take 60 mg by mouth daily.   Eliquis 5 MG Tabs tablet Generic drug: apixaban Take 5 mg by mouth 2 (two) times daily.   furosemide 20 MG tablet Commonly known as: LASIX Take 3 tablets (60 mg total) by mouth 2 (two) times daily. What changed:  how much to take when to take this    gabapentin 300 MG capsule Commonly known as: NEURONTIN Take 300 mg by mouth daily as needed.   insulin aspart 100 UNIT/ML injection Commonly known as: novoLOG Inject 4 Units into the skin with breakfast, with lunch, and with evening meal. And sliding scale   insulin glargine 100 UNIT/ML injection Commonly known as: LANTUS Inject 10 Units into the skin daily.   isosorbide mononitrate 30 MG 24 hr tablet Commonly known as: IMDUR Take 1 tablet (30 mg total) by mouth daily.   loratadine 10 MG tablet Commonly known as: CLARITIN Take 10 mg by mouth daily as needed for allergies.   losartan 25 MG tablet Commonly known as: COZAAR Take 12.5 mg by mouth every morning.   magnesium oxide 400 MG tablet Commonly known as: MAG-OX Take 400 mg by mouth daily.   metoprolol succinate 50 MG 24 hr tablet Commonly known as: TOPROL-XL Take 50 mg by mouth 2 (two) times daily.   mirtazapine 7.5 MG tablet Commonly known as: REMERON Take 7.5 mg by mouth at bedtime.   nitroGLYCERIN 0.4 MG SL tablet Commonly known as: NITROSTAT Place 0.4 mg under the tongue every 5 (five) minutes as needed.   ONE TOUCH ULTRA TEST test strip Generic drug: glucose blood OneTouch Ultra Blue Test Strip   pantoprazole 20 MG tablet Commonly known as: PROTONIX Take 20 mg by mouth daily.   rosuvastatin 40 MG tablet Commonly known as: CRESTOR Take 40 mg by mouth every morning.               Durable Medical Equipment  (From admission, onward)           Start     Ordered   05/17/21 0737  For home use only DME oxygen  Once       Question Answer Comment  Length of Need Lifetime   Mode or (Route) Nasal cannula   Liters per Minute 3   Frequency Continuous (stationary and portable oxygen unit needed)   Oxygen delivery system Gas      05/17/21 0736            Follow-up Information     Lamar Blinks, MD. Nyra Capes  on 05/25/2021.   Specialty: Cardiology Why: @ 3:00pm Contact information: [address] [facility] [place] [address] [phone]                  Time coordinating discharge: 33 minutes  Signed:  Lurene Shadow  [facility] 05/19/2021, 4:55 PM   Pager on [email or web address]. If 7PM-7AM, please contact night-coverage at [email or web address]

## 2021-05-19 NOTE — Progress Notes (Signed)
[facility]  Patient: Kylie LitterJean E Frymire / Admit Date: 05/15/2021 / Date of Encounter: 05/19/2021, 9:02 AM   Subjective: Patient overall breathing better today no evidence of significant new cardiac symptoms although does have a little bit of chest pain yesterday nigh Hemodynamically stable today.  No evidence of atrial fibrillation by telemetry.  Overall doing well after cardiac catheterization yesterday and has had no evidence of chest discomfort Cardiac catheterization showing significant small and approximately occluded right coronary artery, occluded mid left-sided PDA, patent stented left circumflex, significant atherosclerosis of large diagonal and significant atherosclerosis of proximal left anterior descending artery.  Based on cardiac catheterization the patient treatment options are not abundantly straightforward.  It is possible that her chest pain and other symptoms are secondary to large diagonal not particularly amenable to PCI and stent placement.  Additionally symptoms and issues could be from occluded PDA and right coronary artery.  Therefore, we will likely maximally medicated and treat current issues with discussion with cardiovascular surgery and or other interventional Echocardiogram with mild inferior hypokinesis with ejection fraction of 40%  Review of Systems: Positive for: Chest pain Negative for: Vision change, hearing change, syncope, dizziness, nausea, vomiting,diarrhea, bloody stool, stomach pain, cough, congestion, diaphoresis, urinary frequency, urinary pain,skin lesions, skin rashes Others previously listed  Objective: Telemetry: Normal sinus rhythm Physical Exam: Blood pressure 118/62, pulse 72, temperature 98.3 F (36.8 C), temperature source Oral, resp. rate 16, height 5' (1.524 m), weight 52.2 kg, SpO2 93 %. Body mass index is 22.48 kg/m. General: Well developed, well nourished, in no acute distress. Head: Normocephalic,  atraumatic, sclera non-icteric, no xanthomas, nares are without discharge. Neck: No apparent masses Lungs: Normal respirations with no wheezes, no rhonchi, no rales , no crackles   Heart: Regular rate and rhythm, normal S1 S2, no murmur, no rub, no gallop, PMI is normal size and placement, carotid upstroke normal without bruit, jugular venous pressure normal Abdomen: Soft, non-tender, non-distended with normoactive bowel sounds. No hepatosplenomegaly. Abdominal aorta is normal size without bruit Extremities: No edema, no clubbing, no cyanosis, no ulcers,  Peripheral: 2+ radial, 2+ femoral, 2+ dorsal pedal pulses Neuro: Alert and oriented. Moves all extremities spontaneously. Psych:  Responds to questions appropriately with a normal affect.   Intake/Output Summary (Last 24 hours) at 05/19/2021 0902 Last data filed at 05/18/2021 2106 Gross per 24 hour  Intake 243 ml  Output 0 ml  Net 243 ml     Inpatient Medications:   amiodarone  200 mg Oral Daily   clopidogrel  75 mg Oral Daily   dorzolamide-timolol  1 drop Both Eyes BID   DULoxetine  60 mg Oral Daily   insulin aspart  0-5 Units Subcutaneous QHS   insulin aspart  0-9 Units Subcutaneous TID WC   insulin glargine  10 Units Subcutaneous Daily   isosorbide mononitrate  30 mg Oral Daily   mirtazapine  7.5 mg Oral QHS   pantoprazole  20 mg Oral Daily   rosuvastatin  40 mg Oral q morning   sodium chloride flush  3 mL Intravenous Q12H   Infusions:    Labs: Recent Labs    05/17/21 0812 05/18/21 0530 05/18/21 2053 05/19/21 0751  NA 136 135  --  134*  K 3.2* 3.9  --  3.9  CL 98 98  --  97*  CO2 31 32  --  27  GLUCOSE 218* 259* 488* 239*  BUN 30* 23  --  24*  CREATININE 1.10* 1.19*  --  1.20*  CALCIUM 8.4* 8.6*  --  8.6*  MG 1.9 1.9  --   --     No results for input(s): AST, ALT, ALKPHOS, BILITOT, PROT, ALBUMIN in the last 72 hours.  Recent Labs    05/18/21 0530  WBC 8.0  HGB 12.4  HCT 37.4  MCV 88.8  PLT 304     No results for input(s): CKTOTAL, CKMB, TROPONINI in the last 72 hours. Invalid input(s): POCBNP No results for input(s): HGBA1C in the last 72 hours.   Weights: Filed Weights   05/15/21 0617 05/17/21 0417 05/19/21 0356  Weight: 49.4 kg 50.8 kg 52.2 kg     Radiology/Studies:  DG Chest 1 View  Result Date: 05/15/2021 CLINICAL DATA:  70 year old female with shortness of breath and respiratory distress. EXAM: CHEST  1 VIEW COMPARISON:  Chest radiographs 02/15/2006. FINDINGS: Portable AP upright view at 0634 hours. Similar lung volumes, and visible mediastinal contours appear normal. But there is diffuse increased pulmonary interstitial opacity, patchy bilateral perihilar opacity and confluent bibasilar opacity which most resembles pleural effusions. No pneumothorax or air bronchograms. Visualized tracheal air column is within normal limits. Paucity of bowel gas in the upper abdomen. No acute osseous abnormality identified. IMPRESSION: Abnormal lungs most suggestive of acute pulmonary edema with bilateral pleural effusions and atelectasis. Bilateral infection with pleural effusions felt less likely. Electronically Signed   By: Odessa Fleming M.D.   On: 05/15/2021 07:06   CARDIAC CATHETERIZATION  Result Date: 05/18/2021  Prox RCA lesion is 95% stenosed.  Dist Cx lesion is 100% stenosed.  Prox LAD to Mid LAD lesion is 70% stenosed.  Prox Cx to Mid Cx lesion is 15% stenosed.  1st Diag-1 lesion is 85% stenosed.  1st Diag-2 lesion is 70% stenosed.  70 year old female with hypertension hyperlipidemia and diabetes who has had ST elevation myocardial infarction several weeks prior status post PCI and stent placement of circumflex artery successfully.  The patient additionally had chronically occluded distal left PDA and chronically occluded and/or significantly stenosed right coronary artery proximal that appears to be too small to further intervene upon.  The patient was theoretically going to be staged  for a LAD stenosis intervention 70% proximal LAD although when cardiac catheterization today has more cardiovascular issues with a significantly diffusely diseased 85 and 70% serial lesions of a diagonal artery which is large.  After further discussion with cardiovascular intervention it is appropriate to further medically manage and discuss more closely with cardiovascular surgery and intervention before making further decisions Plan Maximal medical management for anginal symptoms Single antiplatelet therapy due to anticoagulation for atrial fibrillation High intensity cholesterol therapy Cardiac rehabilitation   ECHOCARDIOGRAM COMPLETE  Result Date: 05/15/2021    ECHOCARDIOGRAM REPORT   Patient Name:   ALTIE SAVARD Date of Exam: 05/15/2021 Medical Rec #:  [id number]      Height:       60.0 in Accession #:    [id number]     Weight:       109.0 lb Date of Birth:  March 09, 1951      BSA:          1.442 m Patient Age:    69 years       BP:           144/106 mmHg Patient Gender: F              HR:           55 bpm. Exam Location:  [facility] Procedure:  2D Echo Indications:     Systolic CHF  History:         Patient has no prior history of Echocardiogram examinations.                  Previous Myocardial Infarction; Risk Factors:Hypertension.  Sonographer:     L Thornton-Maynard Referring Phys:  [id number] Brien Few NIU Diagnosing Phys: Julien Nordmann MD IMPRESSIONS  1. Select images concerning for small inferior wall thrombus.  2. Left ventricular ejection fraction, by estimation, is 40 to 45%. The left ventricle has mildly decreased function. The left ventricle demonstrates regional wall motion abnormalities (inferior wall hypokinesis). There is mild left ventricular hypertrophy. Left ventricular diastolic parameters are consistent with Grade II diastolic dysfunction (pseudonormalization).  3. Right ventricular systolic function is normal. The right ventricular size is normal. There is moderately elevated pulmonary artery systolic  pressure. The estimated right ventricular systolic pressure is 49.7 mmHg.  4. Left atrial size was mildly dilated.  5. The mitral valve is normal in structure, though not well visualized. Severe mitral valve regurgitation.  6. Tricuspid valve regurgitation is moderate.  7. The aortic valve is normal in structure. Aortic valve regurgitation is mild.  8. The inferior vena cava is dilated in size with >50% respiratory variability, suggesting right atrial pressure of 8 mmHg. FINDINGS  Left Ventricle: Left ventricular ejection fraction, by estimation, is 40 to 45%. The left ventricle has mildly decreased function. The left ventricle demonstrates regional wall motion abnormalities. The left ventricular internal cavity size was normal in size. There is mild left ventricular hypertrophy. Left ventricular diastolic parameters are consistent with Grade II diastolic dysfunction (pseudonormalization). Right Ventricle: The right ventricular size is normal. No increase in right ventricular wall thickness. Right ventricular systolic function is normal. There is moderately elevated pulmonary artery systolic pressure. The tricuspid regurgitant velocity is 3.15 m/s, and with an assumed right atrial pressure of 10 mmHg, the estimated right ventricular systolic pressure is 49.7 mmHg. Left Atrium: Left atrial size was mildly dilated. Right Atrium: Right atrial size was normal in size. Pericardium: There is no evidence of pericardial effusion. Mitral Valve: The mitral valve was not well visualized. Severe mitral valve regurgitation. No evidence of mitral valve stenosis. MV peak gradient, 9.7 mmHg. The mean mitral valve gradient is 3.0 mmHg. Tricuspid Valve: The tricuspid valve is normal in structure. Tricuspid valve regurgitation is moderate . No evidence of tricuspid stenosis. Aortic Valve: The aortic valve is normal in structure. Aortic valve regurgitation is mild. Aortic regurgitation PHT measures 719 msec. No aortic stenosis is  present. Aortic valve mean gradient measures 3.0 mmHg. Aortic valve peak gradient measures 5.5 mmHg. Aortic valve area, by VTI measures 1.72 cm. Pulmonic Valve: The pulmonic valve was normal in structure. Pulmonic valve regurgitation is not visualized. No evidence of pulmonic stenosis. Aorta: The aortic root is normal in size and structure. Venous: The inferior vena cava is dilated in size with greater than 50% respiratory variability, suggesting right atrial pressure of 8 mmHg. IAS/Shunts: No atrial level shunt detected by color flow Doppler.  LEFT VENTRICLE PLAX 2D LVIDd:         3.70 cm     Diastology LVIDs:         2.50 cm     LV e' medial:    3.08 cm/s LV PW:         1.00 cm     LV E/e' medial:  41.2 LV IVS:        1.30  cm     LV e' lateral:   4.65 cm/s LVOT diam:     1.80 cm     LV E/e' lateral: 27.3 LV SV:         49 LV SV Index:   34 LVOT Area:     2.54 cm  LV Volumes (MOD) LV vol d, MOD A2C: 68.4 ml LV vol d, MOD A4C: 68.6 ml LV vol s, MOD A2C: 32.9 ml LV vol s, MOD A4C: 31.5 ml LV SV MOD A2C:     35.5 ml LV SV MOD A4C:     68.6 ml LV SV MOD BP:      37.2 ml RIGHT VENTRICLE RV S prime:     11.40 cm/s TAPSE (M-mode): 2.6 cm LEFT ATRIUM             Index       RIGHT ATRIUM          Index LA diam:        4.40 cm 3.05 cm/m  RA Area:     9.65 cm LA Vol (A2C):   50.2 ml 34.81 ml/m RA Volume:   19.80 ml 13.73 ml/m LA Vol (A4C):   54.5 ml 37.79 ml/m LA Biplane Vol: 54.4 ml 37.72 ml/m  AORTIC VALVE                   PULMONIC VALVE AV Area (Vmax):    1.76 cm    PV Vmax:       0.90 m/s AV Area (Vmean):   1.80 cm    PV Peak grad:  3.2 mmHg AV Area (VTI):     1.72 cm AV Vmax:           117.00 cm/s AV Vmean:          86.300 cm/s AV VTI:            0.284 m AV Peak Grad:      5.5 mmHg AV Mean Grad:      3.0 mmHg LVOT Vmax:         80.90 cm/s LVOT Vmean:        61.100 cm/s LVOT VTI:          0.192 m LVOT/AV VTI ratio: 0.68 AI PHT:            719 msec  AORTA Ao Root diam: 2.80 cm MITRAL VALVE                 TRICUSPID VALVE MV Area (PHT): 3.06 cm     TR Peak grad:   39.7 mmHg MV Area VTI:   0.92 cm     TR Vmax:        315.00 cm/s MV Peak grad:  9.7 mmHg MV Mean grad:  3.0 mmHg     SHUNTS MV Vmax:       1.56 m/s     Systemic VTI:  0.19 m MV Vmean:      73.0 cm/s    Systemic Diam: 1.80 cm MV E velocity: 127.00 cm/s MV A velocity: 93.80 cm/s MV E/A ratio:  1.35 Julien Nordmann MD Electronically signed by Julien Nordmann MD Signature Date/Time: 05/15/2021/3:45:18 PM    Final      Assessment and Recommendation  70 y.o. female with paroxysmal nonvalvular atrial fibrillation known coronary artery disease with acute on chronic systolic dysfunction congestive heart failure and unstable angina slightly improved on better medication management at this time without evidence of myocardial  infarction.  Cardiac catheterization with significant three-vessel coronary artery disease and not abundantly clear as to appropriate intervention at this time with need for medical management and further discussion 1.  Continuation of amiodarone for further risk reduction in potential for atrial fibrillation at current dose without change 2.  Continue furosemide for acute on chronic systolic dysfunction congestive heart failure and pulmonary edema and change to oral dosage at this time 3.   Reinstatement of Eliquis at 5 mg twice per day for further risk reduction stroke with atrial fibrillation upon discharge and will further reassess need for long-term treatment thereafter 4.  High intensity cholesterol therapy for coronary artery disease and previous myocardial infarction 5.  Begin ambulation and follow-up for improvements of symptoms and if ambulating well without any further significant cardiovascular symptoms okay for discharged home with follow-up and further adjustments of medication management and discussion of further potential intervention from the cardiovascular standpoint as outpatient.  We have had a long discussion with her  and the family and will continue working in this direction as an outpatient  Signed, [name] M.D. FACC

## 2021-05-19 NOTE — Progress Notes (Signed)
[name] to be D/C'd Home per MD order.  Discussed prescriptions and follow up appointments with the patient. Prescriptions were sent electronically, medication list explained in detail. Pt verbalized understanding.  Allergies as of 05/19/2021       Reactions   Atorvastatin Other (See Comments)   Other reaction(s): Arthralgia (Joint Pain) Severe cramps   Remifentanil    Other reaction(s): Irregular Heart Rate   Fentanyl    Sulfa Antibiotics Nausea Only, Other (See Comments)        Medication List     STOP taking these medications    ACTOS PO   aspirin 325 MG tablet   buPROPion 150 MG 24 hr tablet Commonly known as: WELLBUTRIN XL   hyoscyamine 0.375 MG 12 hr tablet Commonly known as: LEVBID   insulin detemir 100 UNIT/ML FlexPen Commonly known as: LEVEMIR   lisinopril 10 MG tablet Commonly known as: ZESTRIL   Trulicity 1.5 MG/0.5ML Sopn Generic drug: Dulaglutide       TAKE these medications    amiodarone 200 MG tablet Commonly known as: PACERONE Take 200 mg by mouth daily.   clopidogrel 75 MG tablet Commonly known as: PLAVIX Take 75 mg by mouth daily.   dorzolamide-timolol 22.3-6.8 MG/ML ophthalmic solution Commonly known as: COSOPT Place 1 drop into both eyes 2 (two) times daily.   DULoxetine 60 MG capsule Commonly known as: CYMBALTA Take 60 mg by mouth daily.   Eliquis 5 MG Tabs tablet Generic drug: apixaban Take 5 mg by mouth 2 (two) times daily.   furosemide 20 MG tablet Commonly known as: LASIX Take 3 tablets (60 mg total) by mouth 2 (two) times daily. What changed:  how much to take when to take this   gabapentin 300 MG capsule Commonly known as: NEURONTIN Take 300 mg by mouth daily as needed.   insulin aspart 100 UNIT/ML injection Commonly known as: novoLOG Inject 4 Units into the skin with breakfast, with lunch, and with evening meal. And sliding scale   insulin glargine 100 UNIT/ML injection Commonly known as: LANTUS Inject  10 Units into the skin daily.   isosorbide mononitrate 30 MG 24 hr tablet Commonly known as: IMDUR Take 1 tablet (30 mg total) by mouth daily.   loratadine 10 MG tablet Commonly known as: CLARITIN Take 10 mg by mouth daily as needed for allergies.   losartan 25 MG tablet Commonly known as: COZAAR Take 12.5 mg by mouth every morning.   magnesium oxide 400 MG tablet Commonly known as: MAG-OX Take 400 mg by mouth daily.   metoprolol succinate 50 MG 24 hr tablet Commonly known as: TOPROL-XL Take 50 mg by mouth 2 (two) times daily.   mirtazapine 7.5 MG tablet Commonly known as: REMERON Take 7.5 mg by mouth at bedtime.   nitroGLYCERIN 0.4 MG SL tablet Commonly known as: NITROSTAT Place 0.4 mg under the tongue every 5 (five) minutes as needed.   ONE TOUCH ULTRA TEST test strip Generic drug: glucose blood OneTouch Ultra Blue Test Strip   pantoprazole 20 MG tablet Commonly known as: PROTONIX Take 20 mg by mouth daily.   rosuvastatin 40 MG tablet Commonly known as: CRESTOR Take 40 mg by mouth every morning.               Durable Medical Equipment  (From admission, onward)           Start     Ordered   05/17/21 0737  For home use only DME oxygen  Once  Question Answer Comment  Length of Need Lifetime   Mode or (Route) Nasal cannula   Liters per Minute 3   Frequency Continuous (stationary and portable oxygen unit needed)   Oxygen delivery system Gas      05/17/21 0736            Vitals:   05/19/21 0353 05/19/21 0735  BP: 109/63 118/62  Pulse: 68 72  Resp: 15 16  Temp: 98.2 F (36.8 C) 98.3 F (36.8 C)  SpO2: 94% 93%    Tele box removed and returned, skin clean, dry and intact without evidence of skin break down, no evidence of skin tears noted. IV catheter discontinued intact. Site without signs and symptoms of complications. Dressing and pressure applied. Pt denies pain at this time. No complaints noted.  An After Visit Summary was  printed and given to the patient. Patient escorted via WC, and D/C home via private auto.  Rigoberto Noel

## 2021-05-25 LAB — CULTURE, BLOOD (ROUTINE X 2)
Culture: NO GROWTH
Culture: NO GROWTH
Special Requests: ADEQUATE
Special Requests: ADEQUATE

## 2021-05-26 ENCOUNTER — Encounter: Payer: Medicare HMO | Attending: Internal Medicine | Admitting: *Deleted

## 2021-05-27 NOTE — Progress Notes (Deleted)
   Patient ID: Kylie Thomas, female    DOB: 11-11-1951, 70 y.o.   MRN: [id number]  HPI  Kylie Thomas is a 70 y/o female with a history of  Echo report from 05/15/21 reviewed and showed an EF of 40-45% along with mild LVH, moderate TR and elevated PA pressure of 49.7 mmHg.   LHC done 05/18/21 and showed: Prox RCA lesion is 95% stenosed. Dist Cx lesion is 100% stenosed. Prox LAD to Mid LAD lesion is 70% stenosed. Prox Cx to Mid Cx lesion is 15% stenosed. 1st Diag-1 lesion is 85% stenosed. 1st Diag-2 lesion is 70% stenosed.  Admitted 05/15/21 due to acute on chronic heart failure after diuretic was reduced. Cardiology consult obtained. Initially given IV lasix with transition to oral diuretics. Elevated troponin thought to be due to demand ischemia. LHC done. Discharged after 4 days.   She presents today for her initial visit with a chief complaint of   Review of Systems    Physical Exam    Assessment & Plan:  1: Chronic heart failure with reduced ejection fraction- - NYHA class - BNP 05/15/21 was 1372.8  2: HTN- - BP - saw PCP - BMP 05/19/21 reviewed and showed sodium 134, potassium 3.9, creatinine 1.20 and GFR 49  3: DM-  4: Atrial fibrillation- - saw cardiology Kylie Thomas) 05/25/21

## 2021-05-28 ENCOUNTER — Other Ambulatory Visit: Payer: Self-pay

## 2021-05-28 ENCOUNTER — Emergency Department: Payer: Medicare PPO

## 2021-05-28 ENCOUNTER — Emergency Department
Admission: EM | Admit: 2021-05-28 | Discharge: 2021-05-28 | Disposition: A | Discharge status: Home / Self Care | Payer: Medicare PPO | Attending: Student in an Organized Health Care Education/Training Program | Admitting: Student in an Organized Health Care Education/Training Program

## 2021-05-28 DIAGNOSIS — E1122 Type 2 diabetes mellitus with diabetic chronic kidney disease: Secondary | ICD-10-CM | POA: Insufficient documentation

## 2021-05-28 DIAGNOSIS — Z794 Long term (current) use of insulin: Secondary | ICD-10-CM | POA: Diagnosis not present

## 2021-05-28 DIAGNOSIS — E1136 Type 2 diabetes mellitus with diabetic cataract: Secondary | ICD-10-CM | POA: Insufficient documentation

## 2021-05-28 DIAGNOSIS — I5023 Acute on chronic systolic (congestive) heart failure: Secondary | ICD-10-CM | POA: Diagnosis not present

## 2021-05-28 DIAGNOSIS — N183 Chronic kidney disease, stage 3 unspecified: Secondary | ICD-10-CM | POA: Insufficient documentation

## 2021-05-28 DIAGNOSIS — E785 Hyperlipidemia, unspecified: Secondary | ICD-10-CM | POA: Insufficient documentation

## 2021-05-28 DIAGNOSIS — R0789 Other chest pain: Secondary | ICD-10-CM | POA: Insufficient documentation

## 2021-05-28 DIAGNOSIS — Z79899 Other long term (current) drug therapy: Secondary | ICD-10-CM | POA: Diagnosis not present

## 2021-05-28 DIAGNOSIS — Z955 Presence of coronary angioplasty implant and graft: Secondary | ICD-10-CM | POA: Insufficient documentation

## 2021-05-28 DIAGNOSIS — E1139 Type 2 diabetes mellitus with other diabetic ophthalmic complication: Secondary | ICD-10-CM | POA: Insufficient documentation

## 2021-05-28 DIAGNOSIS — Z7902 Long term (current) use of antithrombotics/antiplatelets: Secondary | ICD-10-CM | POA: Diagnosis not present

## 2021-05-28 DIAGNOSIS — Z7901 Long term (current) use of anticoagulants: Secondary | ICD-10-CM | POA: Diagnosis not present

## 2021-05-28 DIAGNOSIS — E1169 Type 2 diabetes mellitus with other specified complication: Secondary | ICD-10-CM | POA: Insufficient documentation

## 2021-05-28 DIAGNOSIS — I130 Hypertensive heart and chronic kidney disease with heart failure and stage 1 through stage 4 chronic kidney disease, or unspecified chronic kidney disease: Secondary | ICD-10-CM | POA: Insufficient documentation

## 2021-05-28 DIAGNOSIS — R072 Precordial pain: Secondary | ICD-10-CM | POA: Insufficient documentation

## 2021-05-28 LAB — BASIC METABOLIC PANEL
Anion gap: 10 (ref 5–15)
BUN: 28 mg/dL — ABNORMAL HIGH (ref 8–23)
CO2: 30 mmol/L (ref 22–32)
Calcium: 8.8 mg/dL — ABNORMAL LOW (ref 8.9–10.3)
Chloride: 95 mmol/L — ABNORMAL LOW (ref 98–111)
Creatinine, Ser: 1.03 mg/dL — ABNORMAL HIGH (ref 0.44–1.00)
GFR, Estimated: 58 mL/min — ABNORMAL LOW (ref >60)
Glucose, Bld: 321 mg/dL — ABNORMAL HIGH (ref 70–99)
Potassium: 3.6 mmol/L (ref 3.5–5.1)
Sodium: 135 mmol/L (ref 135–145)

## 2021-05-28 LAB — CBC
HCT: 37.6 % (ref 36.0–46.0)
Hemoglobin: 12.1 g/dL (ref 12.0–15.0)
MCH: 29.4 pg (ref 26.0–34.0)
MCHC: 32.2 g/dL (ref 30.0–36.0)
MCV: 91.5 fL (ref 80.0–100.0)
Platelets: 312 10*3/uL (ref 150–400)
RBC: 4.11 MIL/uL (ref 3.87–5.11)
RDW: 13.2 % (ref 11.5–15.5)
WBC: 9.6 10*3/uL (ref 4.0–10.5)
nRBC: 0 % (ref 0.0–0.2)

## 2021-05-28 LAB — TROPONIN I (HIGH SENSITIVITY)
Troponin I (High Sensitivity): 24 ng/L — ABNORMAL HIGH (ref <18)
Troponin I (High Sensitivity): 22 ng/L — ABNORMAL HIGH (ref <18)

## 2021-05-28 MED ORDER — ISOSORBIDE MONONITRATE ER 30 MG PO TB24: 60.0000 mg | ORAL_TABLET | Freq: Every day | ORAL | 0 refills | Status: DC

## 2021-05-28 NOTE — ED Triage Notes (Addendum)
Pt states she has recent heart attack with stent placement, pt c/o chest pain in the middle of chest that was dull pressure. Pt states she had this feeling after eating a bagel. Pt states she also has sore throat. EMS gave 324 asa and pt took 1 nitro pta

## 2021-05-28 NOTE — Discharge Instructions (Addendum)
Pleae increase your Imdur to 60mg  daily.  Return for any new discomfort, pain or concerns.

## 2021-05-28 NOTE — ED Provider Notes (Signed)
[facility] Emergency Department Provider Note    Event Date/Time   First MD Initiated Contact with Patient 05/28/21 0815     (approximate)  I have reviewed the triage vital signs and the nursing notes.   HISTORY  Chief Complaint Chest Pain    HPI IRAN KIEVIT is a 70 y.o. female with a history of CAD status post recent admission for ACS with stent placement presents to the ER for evaluation midsternal chest pain that radiated into her neck.  States that symptoms started after she was eating a bagel.  Felt like some food got stuck in her throat but then was able to pass and then melanite woke up with persistent discomfort similar that started to radiate up into her neck.  There is no associated nausea or vomiting.  No diaphoresis.  The pain was different than the symptoms that brought her to the hospital previously as there is no radiation of the shoulders.  She is currently pain-free.  She called EMS and was given aspirin she took 1 nitro without much change in her symptoms.  Felt the discomfort resolved spontaneously.  History reviewed. No pertinent past medical history. Family History  Problem Relation Age of Onset   Heart disease Mother    Diabetes Father    Heart attack Father    Past Surgical History:  Procedure Laterality Date   BELPHAROPTOSIS REPAIR     CATARACT EXTRACTION, BILATERAL     CESAREAN SECTION     LEFT HEART CATH AND CORONARY ANGIOGRAPHY N/A 05/18/2021   Procedure: LEFT HEART CATH AND CORONARY ANGIOGRAPHY;  Surgeon: Lamar Blinks, MD;  Location: [facility] INVASIVE CV LAB;  Service: Cardiovascular;  Laterality: N/A;   TRIGGER FINGER RELEASE     x 7   Patient Active Problem List   Diagnosis Date Noted   CHF exacerbation (HCC) 05/15/2021   Acute on chronic systolic CHF (congestive heart failure) (HCC) 05/15/2021   Acute respiratory failure with hypoxia (HCC) 05/15/2021   HTN (hypertension) 05/15/2021   HLD (hyperlipidemia)  05/15/2021   Type II diabetes mellitus with renal manifestations (HCC) 05/15/2021   Depression with anxiety 05/15/2021   CKD (chronic kidney disease), stage IIIa 05/15/2021   CAD (coronary artery disease) 05/15/2021   Atrial fibrillation, chronic (HCC) 05/15/2021   Hypothermia 05/15/2021   Sepsis (HCC) 05/15/2021   Elevated troponin 05/15/2021   UTI (urinary tract infection) 05/15/2021   Acquired trigger finger 04/14/2020   Localized, primary osteoarthritis of hand 04/14/2020   Ptosis of both eyelids 07/10/2017   Age-related nuclear cataract of left eye 02/14/2017   Nuclear sclerosis of both eyes 12/27/2016   Nonsenile nuclear cataract of both eyes 08/04/2014   Type II or unspecified type diabetes mellitus with ophthalmic manifestations, uncontrolled(250.52) 11/06/2013   Type II or unspecified type diabetes mellitus without mention of complication, uncontrolled 11/06/2013   GAD (generalized anxiety disorder) 03/07/2013   Plantar fasciitis of left foot 03/07/2013   Symptomatic menopausal or female climacteric states 03/07/2013      Prior to Admission medications   Medication Sig Start Date End Date Taking? Authorizing Provider  amiodarone (PACERONE) 200 MG tablet Take 200 mg by mouth daily. 04/28/21   [provider]  clopidogrel (PLAVIX) 75 MG tablet Take 75 mg by mouth daily. 04/28/21   [provider]  dorzolamide-timolol (COSOPT) 22.3-6.8 MG/ML ophthalmic solution Place 1 drop into both eyes 2 (two) times daily. 07/08/18   [provider]  DULoxetine (CYMBALTA) 60 MG capsule  Take 60 mg by mouth daily. 06/03/18   [provider]  ELIQUIS 5 MG TABS tablet Take 5 mg by mouth 2 (two) times daily. 04/28/21   [provider]  furosemide (LASIX) 20 MG tablet Take 3 tablets (60 mg total) by mouth 2 (two) times daily. 05/18/21   Lurene Shadow, MD  gabapentin (NEURONTIN) 300 MG capsule Take 300 mg by mouth daily as needed.    [provider]   glucose blood (ONE TOUCH ULTRA TEST) test strip OneTouch Ultra Blue Test Strip    [provider]  insulin aspart (NOVOLOG) 100 UNIT/ML injection Inject 4 Units into the skin with breakfast, with lunch, and with evening meal. And sliding scale 04/28/21 05/28/21  [provider]  insulin glargine (LANTUS) 100 UNIT/ML injection Inject 10 Units into the skin daily. 04/28/21 05/28/21  [provider]  isosorbide mononitrate (IMDUR) 30 MG 24 hr tablet Take 1 tablet (30 mg total) by mouth daily. 05/18/21   Lurene Shadow, MD  loratadine (CLARITIN) 10 MG tablet Take 10 mg by mouth daily as needed for allergies.    [provider]  losartan (COZAAR) 25 MG tablet Take 12.5 mg by mouth every morning. 04/28/21   [provider]  magnesium oxide (MAG-OX) 400 MG tablet Take 400 mg by mouth daily.    [provider]  metoprolol succinate (TOPROL-XL) 50 MG 24 hr tablet Take 50 mg by mouth 2 (two) times daily. 04/28/21   [provider]  mirtazapine (REMERON) 7.5 MG tablet Take 7.5 mg by mouth at bedtime. 04/28/21   [provider]  nitroGLYCERIN (NITROSTAT) 0.4 MG SL tablet Place 0.4 mg under the tongue every 5 (five) minutes as needed. 04/28/21   [provider]  pantoprazole (PROTONIX) 20 MG tablet Take 20 mg by mouth daily.    [provider]  rosuvastatin (CRESTOR) 40 MG tablet Take 40 mg by mouth every morning. 04/28/21   [provider]    Allergies Atorvastatin, Remifentanil, Fentanyl, and Sulfa antibiotics    Social History Social History   Tobacco Use   Smoking status: Never   Smokeless tobacco: Never  Vaping Use   Vaping Use: Never used  Substance Use Topics   Alcohol use: Not Currently   Drug use: Not Currently    Review of Systems Patient denies headaches, rhinorrhea, blurry vision, numbness, shortness of breath, chest pain, edema, cough, abdominal pain, nausea, vomiting, diarrhea, dysuria, fevers, rashes  or hallucinations unless otherwise stated above in HPI. ____________________________________________   PHYSICAL EXAM:  VITAL SIGNS: Vitals:   05/28/21 0506 05/28/21 0809  BP: (!) 125/55 119/62  Pulse: (!) 59 61  Resp: 18 (!) 24  Temp: 98.1 F (36.7 C)   SpO2: 97% 98%    Constitutional: Alert and oriented.  Eyes: Conjunctivae are normal.  Head: Atraumatic. Nose: No congestion/rhinnorhea. Mouth/Throat: Mucous membranes are moist.   Neck: No stridor. Painless ROM.  Cardiovascular: Normal rate, regular rhythm. Grossly normal heart sounds.  Good peripheral circulation. Respiratory: Normal respiratory effort.  No retractions. Lungs CTAB. Gastrointestinal: Soft and nontender. No distention. No abdominal bruits. No CVA tenderness. Genitourinary:  Musculoskeletal: No lower extremity tenderness nor edema.  No joint effusions. Neurologic:  Normal speech and language. No gross focal neurologic deficits are appreciated. No facial droop Skin:  Skin is warm, dry and intact. No rash noted. Psychiatric: Mood and affect are normal. Speech and behavior are normal.  ____________________________________________   LABS (all labs ordered are listed, but only abnormal  results are displayed)  Results for orders placed or performed during the hospital encounter of 05/28/21 (from the past 24 hour(s))  Basic metabolic panel     Status: Abnormal   Collection Time: 05/28/21  5:09 AM  Result Value Ref Range   Sodium 135 135 - 145 mmol/L   Potassium 3.6 3.5 - 5.1 mmol/L   Chloride 95 (L) 98 - 111 mmol/L   CO2 30 22 - 32 mmol/L   Glucose, Bld 321 (H) 70 - 99 mg/dL   BUN 28 (H) 8 - 23 mg/dL   Creatinine, Ser 4.09 (H) 0.44 - 1.00 mg/dL   Calcium 8.8 (L) 8.9 - 10.3 mg/dL   GFR, Estimated 58 (L) >60 mL/min   Anion gap 10 5 - 15  CBC     Status: None   Collection Time: 05/28/21  5:09 AM  Result Value Ref Range   WBC 9.6 4.0 - 10.5 K/uL   RBC 4.11 3.87 - 5.11 MIL/uL   Hemoglobin 12.1 12.0 - 15.0  g/dL   HCT 81.1 91.4 - 78.2 %   MCV 91.5 80.0 - 100.0 fL   MCH 29.4 26.0 - 34.0 pg   MCHC 32.2 30.0 - 36.0 g/dL   RDW 95.6 21.3 - 08.6 %   Platelets 312 150 - 400 K/uL   nRBC 0.0 0.0 - 0.2 %  Troponin I (High Sensitivity)     Status: Abnormal   Collection Time: 05/28/21  5:09 AM  Result Value Ref Range   Troponin I (High Sensitivity) 24 (H) <18 ng/L   ____________________________________________  EKG My review and personal interpretation at Time: 5:13   Indication: chest pain  Rate: 60  Rhythm: sinus Axis: normal Other: normal intervals, no stemi, nonsepcific st and t wave abn ____________________________________________  RADIOLOGY  I personally reviewed all radiographic images ordered to evaluate for the above acute complaints and reviewed radiology reports and findings.  These findings were personally discussed with the patient.  Please see medical record for radiology report.  ____________________________________________   PROCEDURES  Procedure(s) performed:  Procedures    Critical Care performed: no ____________________________________________   INITIAL IMPRESSION / ASSESSMENT AND PLAN / ED COURSE  Pertinent labs & imaging results that were available during my care of the patient were reviewed by me and considered in my medical decision making (see chart for details).   DDX: ACS, pericarditis, esophagitis, boerhaaves, pe, dissection, pna, bronchitis, costochondritis   BAY JARQUIN is a 70 y.o. who presents to the ED with presentation as described above.  She is currently pain-free nontoxic-appearing.  She is high risk given recent PCI but pain described as different from previous event she is been compliant with her medications.  EKG with some nonspecific changes her initial troponin is downtrending from previous.  She is pain-free at this time will order repeat troponin to trend and consult cardiology.  Clinical Course as of 05/28/21 1523  Sat May 28, 2021   0950 Patient's troponin continues to downtrend.  She is currently pain-free and has been since she arrived.  EKG reviewed with cardiology it is consistent with previous.  Patient states that she does not want to be admitted to the hospital at this time prefer outpatient follow-up will return if her pain worsens.  Discussed case with Dr. Lady Gary who has recommended increasing her Imdur to 22. [PR]    Clinical Course User Index [PR] Willy Eddy, MD    The patient was evaluated in Emergency Department today for the symptoms  described in the history of present illness. He/she was evaluated in the context of the global COVID-19 pandemic, which necessitated consideration that the patient might be at risk for infection with the SARS-CoV-2 virus that causes COVID-19. Institutional protocols and algorithms that pertain to the evaluation of patients at risk for COVID-19 are in a state of rapid change based on information released by regulatory bodies including the CDC and federal and state organizations. These policies and algorithms were followed during the patient's care in the ED.  As part of my medical decision making, I reviewed the following data within the electronic MEDICAL RECORD NUMBER Nursing notes reviewed and incorporated, Labs reviewed, notes from prior ED visits and [place] Controlled Substance Database   ____________________________________________   FINAL CLINICAL IMPRESSION(S) / ED DIAGNOSES  Final diagnoses:  Atypical chest pain      NEW MEDICATIONS STARTED DURING THIS VISIT:  New Prescriptions   No medications on file     Note:  This document was prepared using Dragon voice recognition software and may include unintentional dictation errors.    Willy Eddyobinson, Zeinab Rodwell, MD 05/28/21 [phone]

## 2021-05-31 ENCOUNTER — Ambulatory Visit: Payer: Medicare HMO | Admitting: Family

## 2021-05-31 NOTE — Progress Notes (Signed)
Patient ID: Kylie Thomas, female    DOB: 1951-09-26, 70 y.o.   MRN: [id number]  HPI  Ms Mclees is a 70 y/o female with a history of anxiety, atrial fibrillation, CAD, depression, DM, GERD, hyperlipidemia, HTN, CKD and chronic heart failure.   Echo report from 05/15/21 reviewed and showed an EF of 40-45% along with mild LVH, moderate TR and elevated PA pressure of 49.7 mmHg.   LHC done 05/18/21 and showed: Prox RCA lesion is 95% stenosed. Dist Cx lesion is 100% stenosed. Prox LAD to Mid LAD lesion is 70% stenosed. Prox Cx to Mid Cx lesion is 15% stenosed. 1st Diag-1 lesion is 85% stenosed. 1st Diag-2 lesion is 70% stenosed.  Was in the ED 05/28/21 due to atypical chest pain where she was treated and released. Admitted 05/15/21 due to acute on chronic heart failure after diuretic was reduced. Cardiology consult obtained. Initially given IV lasix with transition to oral diuretics. Elevated troponin thought to be due to demand ischemia. LHC done. Discharged after 4 days.   She presents today for her initial visit with a chief complaint of minimal shortness of breath upon moderate exertion. She describes this as having been present for many months. Shortness of breath resolves after sitting for a few minutes. She has associated fatigue, intermittent cough and chronic difficulty sleeping along with this. She denies any dizziness, abdominal distention, palpitations, pedal edema, chest pain or weight gain.   Says that she experienced some dizziness while on losartan but since she's stopped it, the dizziness resolved.   Has had quite a bit of stress as she's been going through a 10 year long divorce.  Past Medical History:  Diagnosis Date   Anxiety    Arrhythmia    Atrial fibrillation (HCC)    CHF (congestive heart failure) (HCC)    Coronary artery disease    Depression    Diabetes mellitus without complication (HCC)    GERD (gastroesophageal reflux disease)    Hyperlipidemia    Hypertension     Past Surgical History:  Procedure Laterality Date   BELPHAROPTOSIS REPAIR     CATARACT EXTRACTION, BILATERAL     CESAREAN SECTION     LEFT HEART CATH AND CORONARY ANGIOGRAPHY N/A 05/18/2021   Procedure: LEFT HEART CATH AND CORONARY ANGIOGRAPHY;  Surgeon: Lamar Blinks, MD;  Location: [facility] INVASIVE CV LAB;  Service: Cardiovascular;  Laterality: N/A;   TRIGGER FINGER RELEASE     x 7   Family History  Problem Relation Age of Onset   Heart disease Mother    Diabetes Father    Heart attack Father    Social History   Tobacco Use   Smoking status: Never   Smokeless tobacco: Never  Substance Use Topics   Alcohol use: Not Currently   Allergies  Allergen Reactions   Atorvastatin Other (See Comments)    Other reaction(s): Arthralgia (Joint Pain) Severe cramps   Remifentanil     Other reaction(s): Irregular Heart Rate   Fentanyl    Sulfa Antibiotics Nausea Only and Other (See Comments)   Prior to Admission medications   Medication Sig Start Date End Date Taking? Authorizing Provider  amiodarone (PACERONE) 200 MG tablet Take 200 mg by mouth daily. 04/28/21  Yes [provider]  clopidogrel (PLAVIX) 75 MG tablet Take 75 mg by mouth daily. 04/28/21  Yes [provider]  dorzolamide-timolol (COSOPT) 22.3-6.8 MG/ML ophthalmic solution Place 1 drop into both eyes 2 (two) times daily. 07/08/18  Yes [provider]  DULoxetine (CYMBALTA) 60 MG capsule Take 60 mg by mouth daily. 06/03/18  Yes [provider]  ELIQUIS 5 MG TABS tablet Take 5 mg by mouth 2 (two) times daily. 04/28/21  Yes [provider]  furosemide (LASIX) 20 MG tablet Take 3 tablets (60 mg total) by mouth 2 (two) times daily. 05/18/21  Yes Lurene Shadow, MD  gabapentin (NEURONTIN) 300 MG capsule Take 300 mg by mouth daily as needed.   Yes [provider]  glucose blood test strip OneTouch Ultra Blue Test Strip   Yes [provider]  insulin aspart (NOVOLOG) 100  UNIT/ML injection Inject 4 Units into the skin with breakfast, with lunch, and with evening meal. And sliding scale 04/28/21 06/01/21 Yes [provider]  insulin glargine (LANTUS) 100 UNIT/ML injection Inject 10 Units into the skin daily. 04/28/21 06/01/21 Yes [provider]  isosorbide mononitrate (IMDUR) 30 MG 24 hr tablet Take 2 tablets (60 mg total) by mouth daily. 05/28/21  Yes Willy Eddy, MD  loratadine (CLARITIN) 10 MG tablet Take 10 mg by mouth daily as needed for allergies.   Yes [provider]  magnesium oxide (MAG-OX) 400 MG tablet Take 400 mg by mouth daily.   Yes [provider]  metoprolol succinate (TOPROL-XL) 50 MG 24 hr tablet Take 50 mg by mouth 2 (two) times daily. 04/28/21  Yes [provider]  mirtazapine (REMERON) 7.5 MG tablet Take 7.5 mg by mouth at bedtime. 04/28/21  Yes [provider]  pantoprazole (PROTONIX) 20 MG tablet Take 20 mg by mouth daily.   Yes [provider]  rosuvastatin (CRESTOR) 40 MG tablet Take 40 mg by mouth every morning. 04/28/21  Yes [provider]  losartan (COZAAR) 25 MG tablet Take 12.5 mg by mouth every morning. Patient not taking: Reported on 06/01/2021 04/28/21   [provider]  nitroGLYCERIN (NITROSTAT) 0.4 MG SL tablet Place 1 tablet (0.4 mg total) under the tongue every 5 (five) minutes as needed. 06/01/21   Delma Freeze, FNP    Review of Systems  Constitutional:  Positive for fatigue. Negative for appetite change.  HENT:  Positive for dental problem (right side). Negative for congestion and sore throat.   Eyes: Negative.   Respiratory:  Positive for cough ("little bit") and shortness of breath. Negative for chest tightness.   Cardiovascular:  Negative for chest pain, palpitations and leg swelling.  Gastrointestinal:  Negative for abdominal distention and abdominal pain.  Endocrine: Negative.   Genitourinary: Negative.   Musculoskeletal:  Negative for back pain and  neck pain.  Skin: Negative.   Allergic/Immunologic: Negative.   Neurological:  Negative for dizziness and light-headedness.  Hematological:  Negative for adenopathy. Does not bruise/bleed easily.  Psychiatric/Behavioral:  Positive for sleep disturbance (chronic; sleeping on 1 pillow). Negative for dysphoric mood.    Vitals:   06/01/21 0837  BP: (!) 107/47  Pulse: (!) 58  Resp: 14  SpO2: 96%  Weight: 107 lb 2 oz (48.6 kg)  Height: 5' (1.524 m)   Wt Readings from Last 3 Encounters:  06/01/21 107 lb 2 oz (48.6 kg)  05/28/21 109 lb (49.4 kg)  05/19/21 115 lb 1.3 oz (52.2 kg)   Lab Results  Component Value Date   CREATININE 1.03 (H) 05/28/2021   CREATININE 1.20 (H) 05/19/2021   CREATININE 1.19 (H) 05/18/2021    Physical Exam Vitals and nursing note reviewed.  Constitutional:      Appearance: Normal appearance.  HENT:  Head: Normocephalic and atraumatic.  Cardiovascular:     Rate and Rhythm: Regular rhythm. Bradycardia present.  Pulmonary:     Effort: Pulmonary effort is normal. No respiratory distress.     Breath sounds: No wheezing or rales.  Abdominal:     General: There is no distension.     Palpations: Abdomen is soft.  Musculoskeletal:        General: No tenderness.     Cervical back: Normal range of motion and neck supple.     Right lower leg: No edema.     Left lower leg: No edema.  Skin:    General: Skin is warm and dry.  Neurological:     General: No focal deficit present.     Mental Status: She is alert and oriented to person, place, and time.  Psychiatric:        Mood and Affect: Mood normal.        Behavior: Behavior normal.        Thought Content: Thought content normal.      Assessment & Plan:  1: Chronic heart failure with reduced ejection fraction- - NYHA class II - euvolemic today - weighing daily; reminded to call for an overnight weight gain of > 2 pounds or a weekly weight gain of > 5 pounds - not adding salt although does eat  occasionally at an [place] place that serves good broccoli and cheese soup & pizza; reviewed the importance of not adding salt to her food and to try and pick low sodium foods when she eats out. If she's going to eat that soup / pizza, she needs to be cautious what she eats prior and after that meal as she's going to get enough salt in that soup - low sodium cookbook given to her  - was on losartan but reports feeling dizzy; has since stopped it and no longer feels dizzy - on GDMT of metoprolol; unable to titrate due to bradycardia - current BP will not allow for entresto - could consider spironolactone or SGLT2; would need to watch BP closely - BNP 05/15/21 was 1372.8  2: HTN- - BP looks good although on the low side (107/47) - BMP 05/28/21 reviewed and showed sodium 135, potassium 3.6, creatinine 1.03 and GFR 58  3: Atrial fibrillation- - saw cardiology Gwen Pounds) 05/25/21 - currently on amiodarone and eliquis   Due to HF stability, will not make a return appointment for patient at this time. Advised patient that she could call back at anytime to make another appointment or for any questions and she was comfortable with this plan.

## 2021-06-01 ENCOUNTER — Encounter: Payer: Self-pay | Admitting: Family

## 2021-06-01 ENCOUNTER — Ambulatory Visit: Payer: Medicare PPO | Attending: Family | Admitting: Family

## 2021-06-01 ENCOUNTER — Other Ambulatory Visit: Payer: Self-pay

## 2021-06-01 VITALS — BP 107/47 | HR 58 | Resp 14 | Ht 60.0 in | Wt 107.1 lb

## 2021-06-01 DIAGNOSIS — I10 Essential (primary) hypertension: Secondary | ICD-10-CM

## 2021-06-01 DIAGNOSIS — Z833 Family history of diabetes mellitus: Secondary | ICD-10-CM | POA: Insufficient documentation

## 2021-06-01 DIAGNOSIS — F419 Anxiety disorder, unspecified: Secondary | ICD-10-CM | POA: Insufficient documentation

## 2021-06-01 DIAGNOSIS — I2510 Atherosclerotic heart disease of native coronary artery without angina pectoris: Secondary | ICD-10-CM | POA: Insufficient documentation

## 2021-06-01 DIAGNOSIS — Z8249 Family history of ischemic heart disease and other diseases of the circulatory system: Secondary | ICD-10-CM | POA: Diagnosis not present

## 2021-06-01 DIAGNOSIS — Z882 Allergy status to sulfonamides status: Secondary | ICD-10-CM | POA: Insufficient documentation

## 2021-06-01 DIAGNOSIS — Z794 Long term (current) use of insulin: Secondary | ICD-10-CM | POA: Insufficient documentation

## 2021-06-01 DIAGNOSIS — I499 Cardiac arrhythmia, unspecified: Secondary | ICD-10-CM | POA: Insufficient documentation

## 2021-06-01 DIAGNOSIS — K219 Gastro-esophageal reflux disease without esophagitis: Secondary | ICD-10-CM | POA: Insufficient documentation

## 2021-06-01 DIAGNOSIS — I4891 Unspecified atrial fibrillation: Secondary | ICD-10-CM | POA: Diagnosis not present

## 2021-06-01 DIAGNOSIS — Z7901 Long term (current) use of anticoagulants: Secondary | ICD-10-CM | POA: Diagnosis not present

## 2021-06-01 DIAGNOSIS — E785 Hyperlipidemia, unspecified: Secondary | ICD-10-CM | POA: Insufficient documentation

## 2021-06-01 DIAGNOSIS — I5022 Chronic systolic (congestive) heart failure: Secondary | ICD-10-CM | POA: Diagnosis present

## 2021-06-01 DIAGNOSIS — E1122 Type 2 diabetes mellitus with diabetic chronic kidney disease: Secondary | ICD-10-CM | POA: Insufficient documentation

## 2021-06-01 DIAGNOSIS — F32A Depression, unspecified: Secondary | ICD-10-CM | POA: Insufficient documentation

## 2021-06-01 DIAGNOSIS — I130 Hypertensive heart and chronic kidney disease with heart failure and stage 1 through stage 4 chronic kidney disease, or unspecified chronic kidney disease: Secondary | ICD-10-CM | POA: Diagnosis not present

## 2021-06-01 DIAGNOSIS — Z885 Allergy status to narcotic agent status: Secondary | ICD-10-CM | POA: Insufficient documentation

## 2021-06-01 DIAGNOSIS — N189 Chronic kidney disease, unspecified: Secondary | ICD-10-CM | POA: Diagnosis not present

## 2021-06-01 DIAGNOSIS — I482 Chronic atrial fibrillation, unspecified: Secondary | ICD-10-CM

## 2021-06-01 MED ORDER — NITROGLYCERIN 0.4 MG SL SUBL: 0.4000 mg | SUBLINGUAL_TABLET | SUBLINGUAL | 3 refills | Status: AC | PRN

## 2021-06-01 NOTE — Patient Instructions (Addendum)
Continue weighing daily and call for an overnight weight gain of > 2 pounds or a weekly weight gain of >5 pounds.     Call us in the future if you'd like to schedule another appointment or for any questions.   Low-Sodium Eating Plan Sodium, which is an element that makes up salt, helps you maintain a healthy balance of fluids in your body. Too much sodium can increase your bloodpressure and cause fluid and waste to be held in your body. Your health care provider or dietitian may recommend following this plan if you have high blood pressure (hypertension), kidney disease, liver disease, or heart failure. Eating less sodium can help lower your blood pressure, reduce swelling, and protect your heart, liver, andkidneys. What are tips for following this plan? Reading food labels The Nutrition Facts label lists the amount of sodium in one serving of the food. If you eat more than one serving, you must multiply the listed amount of sodium by the number of servings. Choose foods with less than 140 mg of sodium per serving. Avoid foods with 300 mg of sodium or more per serving. Shopping  Look for lower-sodium products, often labeled as "low-sodium" or "no salt added." Always check the sodium content, even if foods are labeled as "unsalted" or "no salt added." Buy fresh foods. Avoid canned foods and pre-made or frozen meals. Avoid canned, cured, or processed meats. Buy breads that have less than 80 mg of sodium per slice.  Cooking  Eat more home-cooked food and less restaurant, buffet, and fast food. Avoid adding salt when cooking. Use salt-free seasonings or herbs instead of table salt or sea salt. Check with your health care provider or pharmacist before using salt substitutes. Cook with plant-based oils, such as canola, sunflower, or olive oil.  Meal planning When eating at a restaurant, ask that your food be prepared with less salt or no salt, if possible. Avoid dishes labeled as brined,  pickled, cured, smoked, or made with soy sauce, miso, or teriyaki sauce. Avoid foods that contain MSG (monosodium glutamate). MSG is sometimes added to [place] food, bouillon, and some canned foods. Make meals that can be grilled, baked, poached, roasted, or steamed. These are generally made with less sodium. General information Most people on this plan should limit their sodium intake to 2,000 mg (milligrams) of sodium each day. What foods should I eat? Fruits Fresh, frozen, or canned fruit. Fruit juice. Vegetables Fresh or frozen vegetables. "No salt added" canned vegetables. "No salt added"tomato sauce and paste. Low-sodium or reduced-sodium tomato and vegetable juice. Grains Low-sodium cereals, including oats, puffed wheat and rice, and shredded wheat. Low-sodium crackers. Unsalted rice. Unsalted pasta. Low-sodium bread.Whole-grain breads and whole-grain pasta. Meats and other proteins Fresh or frozen (no salt added) meat, poultry, seafood, and fish. Low-sodium canned tuna and salmon. Unsalted nuts. Dried peas, beans, and lentils withoutadded salt. Unsalted canned beans. Eggs. Unsalted nut butters. Dairy Milk. Soy milk. Cheese that is naturally low in sodium, such as ricotta cheese, fresh mozzarella, or Swiss cheese. Low-sodium or reduced-sodium cheese. Creamcheese. Yogurt. Seasonings and condiments Fresh and dried herbs and spices. Salt-free seasonings. Low-sodium mustard and ketchup. Sodium-free salad dressing. Sodium-free light mayonnaise. Fresh orrefrigerated horseradish. Lemon juice. Vinegar. Other foods Homemade, reduced-sodium, or low-sodium soups. Unsalted popcorn and pretzels.Low-salt or salt-free chips. The items listed above may not be a complete list of foods and beverages you can eat. Contact a dietitian for more information. What foods should I avoid? Vegetables Sauerkraut, pickled vegetables, and  relishes. Olives. [place] fries. Onion rings. Regular canned vegetables (not  low-sodium or reduced-sodium). Regular canned tomato sauce and paste (not low-sodium or reduced-sodium). Regular tomato and vegetable juice (not low-sodium or reduced-sodium). Frozenvegetables in sauces. Grains Instant hot cereals. Bread stuffing, pancake, and biscuit mixes. Croutons. Seasoned rice or pasta mixes. Noodle soup cups. Boxed or frozen macaroni andcheese. Regular salted crackers. Self-rising flour. Meats and other proteins Meat or fish that is salted, canned, smoked, spiced, or pickled. Precooked or cured meat, such as sausages or meat loaves. Tomasa Blase. Ham. Pepperoni. Hot dogs. Corned beef. Chipped beef. Salt pork. Jerky. Pickled herring. Anchovies andsardines. Regular canned tuna. Salted nuts. Dairy Processed cheese and cheese spreads. Hard cheeses. Cheese curds. Blue cheese.Feta cheese. String cheese. Regular cottage cheese. Buttermilk. Canned milk. Fats and oils Salted butter. Regular margarine. Ghee. Bacon fat. Seasonings and condiments Onion salt, garlic salt, seasoned salt, table salt, and sea salt. Canned and packaged gravies. Worcestershire sauce. Tartar sauce. Barbecue sauce. Teriyaki sauce. Soy sauce, including reduced-sodium. Steak sauce. Fish sauce. Oyster sauce. Cocktail sauce. Horseradish that you find on the shelf. Regular ketchup and mustard. Meat flavorings and tenderizers. Bouillon cubes. Hot sauce. Pre-made or packaged marinades. Pre-made or packaged taco seasonings. Relishes.Regular salad dressings. Salsa. Other foods Salted popcorn and pretzels. Corn chips and puffs. Potato and tortilla chips.Canned or dried soups. Pizza. Frozen entrees and pot pies. The items listed above may not be a complete list of foods and beverages you should avoid. Contact a dietitian for more information. Summary Eating less sodium can help lower your blood pressure, reduce swelling, and protect your heart, liver, and kidneys. Most people on this plan should limit their sodium intake to  1,500-2,000 mg (milligrams) of sodium each day. Canned, boxed, and frozen foods are high in sodium. Restaurant foods, fast foods, and pizza are also very high in sodium. You also get sodium by adding salt to food. Try to cook at home, eat more fresh fruits and vegetables, and eat less fast food and canned, processed, or prepared foods. This information is not intended to replace advice given to you by your health care provider. Make sure you discuss any questions you have with your healthcare provider. Document Revised: 12/19/2019 Document Reviewed: 10/15/2019 Elsevier Patient Education  2022 [employer].

## 2021-06-06 ENCOUNTER — Encounter: Payer: Medicare PPO | Attending: Internal Medicine | Admitting: *Deleted

## 2021-06-06 DIAGNOSIS — Z7901 Long term (current) use of anticoagulants: Secondary | ICD-10-CM | POA: Insufficient documentation

## 2021-06-06 DIAGNOSIS — Z79899 Other long term (current) drug therapy: Secondary | ICD-10-CM | POA: Insufficient documentation

## 2021-06-06 DIAGNOSIS — I252 Old myocardial infarction: Secondary | ICD-10-CM | POA: Insufficient documentation

## 2021-06-06 DIAGNOSIS — Z5189 Encounter for other specified aftercare: Secondary | ICD-10-CM | POA: Insufficient documentation

## 2021-06-06 DIAGNOSIS — Z955 Presence of coronary angioplasty implant and graft: Secondary | ICD-10-CM | POA: Insufficient documentation

## 2021-06-06 DIAGNOSIS — I2121 ST elevation (STEMI) myocardial infarction involving left circumflex coronary artery: Secondary | ICD-10-CM

## 2021-06-07 ENCOUNTER — Other Ambulatory Visit: Payer: Self-pay

## 2021-06-07 NOTE — Progress Notes (Signed)
Completed virtual orientation today.  EP evaluation is scheduled for Wed 7/13 at 1330.  Documentation for diagnosis can be found in [facility] encounter 05/15/2021.

## 2021-06-08 ENCOUNTER — Encounter: Payer: Medicare PPO | Admitting: *Deleted

## 2021-06-08 VITALS — Ht 61.5 in | Wt 111.3 lb

## 2021-06-08 DIAGNOSIS — Z7901 Long term (current) use of anticoagulants: Secondary | ICD-10-CM | POA: Diagnosis not present

## 2021-06-08 DIAGNOSIS — Z955 Presence of coronary angioplasty implant and graft: Secondary | ICD-10-CM | POA: Diagnosis not present

## 2021-06-08 DIAGNOSIS — I252 Old myocardial infarction: Secondary | ICD-10-CM | POA: Diagnosis not present

## 2021-06-08 DIAGNOSIS — I2121 ST elevation (STEMI) myocardial infarction involving left circumflex coronary artery: Secondary | ICD-10-CM

## 2021-06-08 DIAGNOSIS — Z79899 Other long term (current) drug therapy: Secondary | ICD-10-CM | POA: Diagnosis not present

## 2021-06-08 DIAGNOSIS — Z5189 Encounter for other specified aftercare: Secondary | ICD-10-CM | POA: Diagnosis present

## 2021-06-08 NOTE — Progress Notes (Signed)
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 [facility])  Status post coronary artery stent placement  Patient's Home Medications on Admission:  Current Outpatient Medications:    amiodarone (PACERONE) 200 MG tablet, Take 200 mg by mouth daily., Disp: , Rfl:    amiodarone (PACERONE) 200 MG tablet, Take 1 tablet by mouth daily., Disp: , Rfl:    apixaban (ELIQUIS) 5 MG TABS tablet, Take by mouth., Disp: , Rfl:    atorvastatin (LIPITOR) 10 MG tablet, Take 1 tablet by mouth daily., Disp: , Rfl:    clopidogrel (PLAVIX) 75 MG tablet, Take 75 mg by mouth daily., Disp: , Rfl:    clopidogrel (PLAVIX) 75 MG tablet, Take 4 tablets (300 mg) by mouth once this evening (04/28/21) followed by 75 mg daily starting on Friday 04/29/21., Disp: , Rfl:    dicyclomine (BENTYL) 10 MG capsule, Take 10 mg by mouth 3 (three) times daily., Disp: , Rfl:    dorzolamide-timolol (COSOPT) 22.3-6.8 MG/ML ophthalmic solution, Place 1 drop into both eyes 2 (two) times daily., Disp: , Rfl: 0   DULoxetine (CYMBALTA) 60 MG capsule, Take 60 mg by mouth daily., Disp: , Rfl: 5   DULoxetine (CYMBALTA) 60 MG capsule, Take 1 capsule by mouth daily., Disp: , Rfl:    ELIQUIS 5 MG TABS tablet, Take 5 mg by mouth 2 (two) times daily., Disp: , Rfl:    furosemide (LASIX) 20 MG tablet, Take 3 tablets (60 mg total) by mouth 2 (two) times daily., Disp: 180 tablet, Rfl: 0   furosemide (LASIX) 20 MG tablet, Take by mouth., Disp: , Rfl:    gabapentin (NEURONTIN) 300 MG capsule, Take 300 mg by mouth daily as needed.,  Disp: , Rfl:    gabapentin (NEURONTIN) 300 MG capsule, Take 1 capsule by mouth daily as needed., Disp: , Rfl:    glucose blood test strip, OneTouch Ultra Blue Test Strip, Disp: , Rfl:    hyoscyamine (LEVBID) 0.375 MG 12 hr tablet, Take by mouth., Disp: , Rfl:    insulin aspart (NOVOLOG) 100 UNIT/ML injection, Inject 4 Units into the skin with breakfast, with lunch, and with evening meal. And sliding scale, Disp: , Rfl:    insulin glargine (LANTUS) 100 UNIT/ML injection, Inject 10 Units into the skin daily., Disp: , Rfl:    isosorbide mononitrate (IMDUR) 30 MG 24 hr tablet, Take 2 tablets (60 mg total) by mouth daily. (Patient not taking: Reported on 06/07/2021), Disp: 30 tablet, Rfl: 0   loratadine (CLARITIN) 10 MG tablet, Take 10 mg by mouth daily as needed for allergies., Disp: , Rfl:    losartan (COZAAR) 25 MG tablet, Take 12.5 mg by mouth every morning. (Patient not taking: No sig reported), Disp: , Rfl:    magnesium oxide (MAG-OX) 400 MG tablet, Take 400 mg by mouth daily., Disp: , Rfl:    metoprolol succinate (TOPROL-XL) 50 MG 24 hr tablet, Take 50 mg by mouth 2 (two) times daily., Disp: , Rfl:    metoprolol succinate (TOPROL-XL) 50 MG 24 hr tablet, Take by mouth., Disp: , Rfl:  mirtazapine (REMERON) 7.5 MG tablet, Take 7.5 mg by mouth at bedtime., Disp: , Rfl:    mirtazapine (REMERON) 7.5 MG tablet, Take 1 tablet by mouth 2 (two) times daily., Disp: , Rfl:    nitroGLYCERIN (NITROSTAT) 0.4 MG SL tablet, Place 1 tablet (0.4 mg total) under the tongue every 5 (five) minutes as needed., Disp: 20 tablet, Rfl: 3   nitroGLYCERIN (NITROSTAT) 0.4 MG SL tablet, Place under the tongue., Disp: , Rfl:    pantoprazole (PROTONIX) 20 MG tablet, Take 20 mg by mouth daily., Disp: , Rfl:    pantoprazole (PROTONIX) 20 MG tablet, Take by mouth., Disp: , Rfl:    rosuvastatin (CRESTOR) 40 MG tablet, Take 40 mg by mouth every morning., Disp: , Rfl:   Past Medical History: Past Medical History:  Diagnosis Date    Anxiety    Arrhythmia    Atrial fibrillation (HCC)    CHF (congestive heart failure) (HCC)    Coronary artery disease    Depression    Diabetes mellitus without complication (HCC)    GERD (gastroesophageal reflux disease)    Hyperlipidemia    Hypertension     Tobacco Use: Social History   Tobacco Use  Smoking Status Never  Smokeless Tobacco Never    Labs: Recent Review Flowsheet Data     Labs for ITP Cardiac and Pulmonary Rehab Latest Ref Rng & Units 02/16/2019 05/16/2021   Cholestrol 0 - 200 mg/dL - 95   LDLCALC 0 - 99 mg/dL - 45   HDL >16 mg/dL - 10(R)   Trlycerides <604 mg/dL - 52   HCO3 54.0 - 98.1 mmol/L 23.7 -        Exercise Target Goals: Exercise Program Goal: Individual exercise prescription set using results from initial 6 min walk test and THRR while considering  patient's activity barriers and safety.   Exercise Prescription Goal: Initial exercise prescription builds to 30-45 minutes a day of aerobic activity, 2-3 days per week.  Home exercise guidelines will be given to patient during program as part of exercise prescription that the participant will acknowledge.   Education: Aerobic Exercise: - Group verbal and visual presentation on the components of exercise prescription. Introduces F.I.T.T principle from ACSM for exercise prescriptions.  Reviews F.I.T.T. principles of aerobic exercise including progression. Written material given at graduation. Flowsheet Row Cardiac Rehab from 06/08/2021 in [facility] Cardiac and Pulmonary Rehab  Education need identified 06/08/21       Education: Resistance Exercise: - Group verbal and visual presentation on the components of exercise prescription. Introduces F.I.T.T principle from ACSM for exercise prescriptions  Reviews F.I.T.T. principles of resistance exercise including progression. Written material given at graduation.    Education: Exercise & Equipment Safety: - Individual verbal instruction and demonstration  of equipment use and safety with use of the equipment. Flowsheet Row Cardiac Rehab from 06/08/2021 in [facility] Cardiac and Pulmonary Rehab  Date 06/08/21  Educator [facility]  Instruction Review Code 1- Verbalizes Understanding       Education: Exercise Physiology & General Exercise Guidelines: - Group verbal and written instruction with models to review the exercise physiology of the cardiovascular system and associated critical values. Provides general exercise guidelines with specific guidelines to those with heart or lung disease.    Education: Flexibility, Balance, Mind/Body Relaxation: - Group verbal and visual presentation with interactive activity on the components of exercise prescription. Introduces F.I.T.T principle from ACSM for exercise prescriptions. Reviews F.I.T.T. principles of flexibility and balance exercise training including progression. Also discusses the  mind body connection.  Reviews various relaxation techniques to help reduce and manage stress (i.e. Deep breathing, progressive muscle relaxation, and visualization). Balance handout provided to take home. Written material given at graduation.   Activity Barriers & Risk Stratification:  Activity Barriers & Cardiac Risk Stratification - 06/08/21 1534       Activity Barriers & Cardiac Risk Stratification   Activity Barriers History of Falls;Balance Concerns;Deconditioning;Muscular Weakness;Shortness of Breath    Cardiac Risk Stratification High             6 Minute Walk:  6 Minute Walk     Row Name 06/08/21 1534         6 Minute Walk   Phase Initial     Distance 1030 feet     Walk Time 6 minutes     # of Rest Breaks 0     MPH 1.95     METS 2.6     RPE 10     Perceived Dyspnea  1     VO2 Peak 9.1     Symptoms Yes (comment)     Comments SOB     Resting HR 63 bpm     Resting BP 122/70     Resting Oxygen Saturation  99 %     Exercise Oxygen Saturation  during 6 min walk 99 %     Max Ex. HR 97 bpm     Max Ex.  BP 128/74     2 Minute Post BP 126/64              Oxygen Initial Assessment:   Oxygen Re-Evaluation:   Oxygen Discharge (Final Oxygen Re-Evaluation):   Initial Exercise Prescription:  Initial Exercise Prescription - 06/08/21 1500       Date of Initial Exercise RX and Referring Provider   Date 06/08/21    Referring Provider [name] MD      Treadmill   MPH 2    Grade 1    Minutes 15    METs 2.81      Arm Ergometer   Level 1    Watts 25    RPM 30    Minutes 15    METs 2      REL-XR   Level 2    Speed 50    Minutes 15    METs 2      Track   Laps 29    Minutes 15    METs 2.58      Prescription Details   Frequency (times per week) 3    Duration Progress to 30 minutes of continuous aerobic without signs/symptoms of physical distress      Intensity   THRR 40-80% of Max Heartrate 98-137    Ratings of Perceived Exertion 11-13    Perceived Dyspnea 0-4      Progression   Progression Continue to progress workloads to maintain intensity without signs/symptoms of physical distress.      Resistance Training   Training Prescription Yes    Weight 3 lb    Reps 10-15             Perform Capillary Blood Glucose checks as needed.  Exercise Prescription Changes:   Exercise Prescription Changes     Row Name 06/08/21 1500             Response to Exercise   Blood Pressure (Admit) 122/70       Blood Pressure (Exercise) 128/74       Blood  Pressure (Exit) 126/64       Heart Rate (Admit) 63 bpm       Heart Rate (Exercise) 97 bpm       Heart Rate (Exit) 68 bpm       Oxygen Saturation (Admit) 99 %       Oxygen Saturation (Exercise) 99 %       Rating of Perceived Exertion (Exercise) 10       Perceived Dyspnea (Exercise) 1       Symptoms SOB       Comments walk test results                Exercise Comments:   Exercise Goals and Review:   Exercise Goals     Row Name 06/08/21 1536             Exercise Goals   Increase  Physical Activity Yes       Intervention Provide advice, education, support and counseling about physical activity/exercise needs.;Develop an individualized exercise prescription for aerobic and resistive training based on initial evaluation findings, risk stratification, comorbidities and participant's personal goals.       Expected Outcomes Short Term: Attend rehab on a regular basis to increase amount of physical activity.;Long Term: Add in home exercise to make exercise part of routine and to increase amount of physical activity.;Long Term: Exercising regularly at least 3-5 days a week.       Increase Strength and Stamina Yes       Intervention Provide advice, education, support and counseling about physical activity/exercise needs.;Develop an individualized exercise prescription for aerobic and resistive training based on initial evaluation findings, risk stratification, comorbidities and participant's personal goals.       Expected Outcomes Short Term: Increase workloads from initial exercise prescription for resistance, speed, and METs.;Short Term: Perform resistance training exercises routinely during rehab and add in resistance training at home;Long Term: Improve cardiorespiratory fitness, muscular endurance and strength as measured by increased METs and functional capacity ( )       Able to understand and use rate of perceived exertion (RPE) scale Yes       Intervention Provide education and explanation on how to use RPE scale       Expected Outcomes Short Term: Able to use RPE daily in rehab to express subjective intensity level;Long Term:  Able to use RPE to guide intensity level when exercising independently       Able to understand and use Dyspnea scale Yes       Intervention Provide education and explanation on how to use Dyspnea scale       Expected Outcomes Short Term: Able to use Dyspnea scale daily in rehab to express subjective sense of shortness of breath during exertion;Long Term:  Able to use Dyspnea scale to guide intensity level when exercising independently       Knowledge and understanding of Target Heart Rate Range (THRR) Yes       Intervention Provide education and explanation of THRR including how the numbers were predicted and where they are located for reference       Expected Outcomes Short Term: Able to state/look up THRR;Long Term: Able to use THRR to govern intensity when exercising independently;Short Term: Able to use daily as guideline for intensity in rehab       Able to check pulse independently Yes       Intervention Provide education and demonstration on how to check pulse in carotid and radial arteries.;Review the importance  of being able to check your own pulse for safety during independent exercise       Expected Outcomes Long Term: Able to check pulse independently and accurately;Short Term: Able to explain why pulse checking is important during independent exercise       Understanding of Exercise Prescription Yes       Intervention Provide education, explanation, and written materials on patient's individual exercise prescription       Expected Outcomes Short Term: Able to explain program exercise prescription;Long Term: Able to explain home exercise prescription to exercise independently                Exercise Goals Re-Evaluation :   Discharge Exercise Prescription (Final Exercise Prescription Changes):  Exercise Prescription Changes - 06/08/21 1500       Response to Exercise   Blood Pressure (Admit) 122/70    Blood Pressure (Exercise) 128/74    Blood Pressure (Exit) 126/64    Heart Rate (Admit) 63 bpm    Heart Rate (Exercise) 97 bpm    Heart Rate (Exit) 68 bpm    Oxygen Saturation (Admit) 99 %    Oxygen Saturation (Exercise) 99 %    Rating of Perceived Exertion (Exercise) 10    Perceived Dyspnea (Exercise) 1    Symptoms SOB    Comments walk test results             Nutrition:  Target Goals: Understanding of nutrition  guidelines, daily intake of sodium 1500mg , cholesterol 200mg , calories 30% from fat and 7% or less from saturated fats, daily to have 5 or more servings of fruits and vegetables.  Education: All About Nutrition: -Group instruction provided by verbal, written material, interactive activities, discussions, models, and posters to present general guidelines for heart healthy nutrition including fat, fiber, MyPlate, the role of sodium in heart healthy nutrition, utilization of the nutrition label, and utilization of this knowledge for meal planning. Follow up email sent as well. Written material given at graduation. Flowsheet Row Cardiac Rehab from 06/08/2021 in [facility] Cardiac and Pulmonary Rehab  Education need identified 06/08/21       Biometrics:  Pre Biometrics - 06/08/21 1536       Pre Biometrics   Height 5' 1.5" (1.562 m)    Weight 111 lb 4.8 oz (50.5 kg)    BMI (Calculated) 20.69    Single Leg Stand 12 seconds              Nutrition Therapy Plan and Nutrition Goals:  Nutrition Therapy & Goals - 06/07/21 1414       Intervention Plan   Intervention Prescribe, educate and counsel regarding individualized specific dietary modifications aiming towards targeted core components such as weight, hypertension, lipid management, diabetes, heart failure and other comorbidities.    Expected Outcomes Short Term Goal: Understand basic principles of dietary content, such as calories, fat, sodium, cholesterol and nutrients.;Short Term Goal: A plan has been developed with personal nutrition goals set during dietitian appointment.;Long Term Goal: Adherence to prescribed nutrition plan.             Nutrition Assessments:  MEDIFICTS Score Key: ?70 Need to make dietary changes  40-70 Heart Healthy Diet ? 40 Therapeutic Level Cholesterol Diet  Flowsheet Row Cardiac Rehab from 06/08/2021 in [facility] Cardiac and Pulmonary Rehab  Picture Your Plate Total Score on Admission 74      Picture Your  Plate Scores: <20 Unhealthy dietary pattern with much room for improvement. 41-50 Dietary pattern unlikely to meet  recommendations for good health and room for improvement. 51-60 More healthful dietary pattern, with some room for improvement.  >60 Healthy dietary pattern, although there may be some specific behaviors that could be improved.    Nutrition Goals Re-Evaluation:   Nutrition Goals Discharge (Final Nutrition Goals Re-Evaluation):   Psychosocial: Target Goals: Acknowledge presence or absence of significant depression and/or stress, maximize coping skills, provide positive support system. Participant is able to verbalize types and ability to use techniques and skills needed for reducing stress and depression.   Education: Stress, Anxiety, and Depression - Group verbal and visual presentation to define topics covered.  Reviews how body is impacted by stress, anxiety, and depression.  Also discusses healthy ways to reduce stress and to treat/manage anxiety and depression.  Written material given at graduation.   Education: Sleep Hygiene -Provides group verbal and written instruction about how sleep can affect your health.  Define sleep hygiene, discuss sleep cycles and impact of sleep habits. Review good sleep hygiene tips.    Initial Review & Psychosocial Screening:  Initial Psych Review & Screening - 06/07/21 1416       Initial Review   Current issues with Current Anxiety/Panic;Current Depression;History of Depression;Current Psychotropic Meds;Current Stress Concerns    Source of Stress Concerns Family;Financial    Comments ongoing divorce issues, trying to sell house, trying to separate finances (his mother is hiding money and funds)      Family Dynamics   Good Support System? Yes   son and daughter, neighbors nearby   Comments Still suffering from her divorce 10 yrs.  She has been coughing for 2 years and still unknown cause, Currently on anxiety meds      Barriers    Psychosocial barriers to participate in program There are no identifiable barriers or psychosocial needs.;The patient should benefit from training in stress management and relaxation.      Screening Interventions   Interventions Encouraged to exercise;To provide support and resources with identified psychosocial needs;Provide feedback about the scores to participant    Expected Outcomes Short Term goal: Utilizing psychosocial counselor, staff and physician to assist with identification of specific Stressors or current issues interfering with healing process. Setting desired goal for each stressor or current issue identified.;Long Term Goal: Stressors or current issues are controlled or eliminated.;Short Term goal: Identification and review with participant of any Quality of Life or Depression concerns found by scoring the questionnaire.;Long Term goal: The participant improves quality of Life and PHQ9 Scores as seen by post scores and/or verbalization of changes             Quality of Life Scores:   Quality of Life - 06/08/21 1539       Quality of Life   Select Quality of Life      Quality of Life Scores   Health/Function Pre 16.64 %    Socioeconomic Pre 19.79 %    Psych/Spiritual Pre 21.79 %    Family Pre 25.5 %    GLOBAL Pre 19.56 %            Scores of 19 and below usually indicate a poorer quality of life in these areas.  A difference of  2-3 points is a clinically meaningful difference.  A difference of 2-3 points in the total score of the Quality of Life Index has been associated with significant improvement in overall quality of life, self-image, physical symptoms, and general health in studies assessing change in quality of life.  PHQ-9: Recent Review  Flowsheet Data     Depression screen [facility] 2/9 06/08/2021 06/01/2021   Decreased Interest 1 0   Down, Depressed, Hopeless 1 0   PHQ - 2 Score 2 0   Altered sleeping 2 -   Tired, decreased energy 2 -   Change in appetite 2 -    Feeling bad or failure about yourself  2 -   Trouble concentrating 0 -   Moving slowly or fidgety/restless 1 -   Suicidal thoughts 0 -   PHQ-9 Score 11 -   Difficult doing work/chores Somewhat difficult -      Interpretation of Total Score  Total Score Depression Severity:  1-4 = Minimal depression, 5-9 = Mild depression, 10-14 = Moderate depression, 15-19 = Moderately severe depression, 20-27 = Severe depression   Psychosocial Evaluation and Intervention:  Psychosocial Evaluation - 06/07/21 1424       Psychosocial Evaluation & Interventions   Interventions Stress management education;Encouraged to exercise with the program and follow exercise prescription    Comments Lillie is 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  She is eager to get started to be able to keep with her kids.  She wants to get better to be able to walk around for shopping.  She has a history of depression and anxiety.  A lot is steming from her on going divorce for 10 years. They keep discovering more funds to argue over.  She is due in court in August to finalize things.  She is a little hesistant to trust doctors as she has been getting the run around with her cough for two years. She also has been having some dizzy spells Her sugars are out of control ranging any where between 400-44 mg/dl.  We talked about trying to get better control while here.  Her son is an exercise science major and eager to her get better as calls to check on her rouinely.  She also has great support from her neighbors who stop by to check in too.    Expected Outcomes Short: Attend rehab to regain strength Long; continue to focus on the good that surrounds her.             Psychosocial Re-Evaluation:   Psychosocial Discharge (Final Psychosocial Re-Evaluation):   Vocational Rehabilitation: Provide vocational rehab assistance to qualifying candidates.   Vocational Rehab Evaluation & Intervention:  Vocational Rehab - 06/07/21 1414        Initial Vocational Rehab Evaluation & Intervention   Assessment shows need for Vocational Rehabilitation No             Education: Education Goals: Education classes will be provided on a variety of topics geared toward better understanding of heart health and risk factor modification. Participant will state understanding/return demonstration of topics presented as noted by education test scores.  Learning Barriers/Preferences:  Learning Barriers/Preferences - 06/07/21 1413       Learning Barriers/Preferences   Learning Barriers Sight   reading glasses, eye injections   Learning Preferences Skilled Demonstration             General Cardiac Education Topics:  AED/CPR: - Group verbal and written instruction with the use of models to demonstrate the basic use of the AED with the basic ABC's of resuscitation.   Anatomy and Cardiac Procedures: - Group verbal and visual presentation and models provide information about basic cardiac anatomy and function. Reviews the testing methods done to diagnose heart disease and the outcomes of the test results.  Describes the treatment choices: Medical Management, Angioplasty, or Coronary Bypass Surgery for treating various heart conditions including Myocardial Infarction, Angina, Valve Disease, and Cardiac Arrhythmias.  Written material given at graduation. Flowsheet Row Cardiac Rehab from 06/08/2021 in [facility] Cardiac and Pulmonary Rehab  Education need identified 06/08/21       Medication Safety: - Group verbal and visual instruction to review commonly prescribed medications for heart and lung disease. Reviews the medication, class of the drug, and side effects. Includes the steps to properly store meds and maintain the prescription regimen.  Written material given at graduation.   Intimacy: - Group verbal instruction through game format to discuss how heart and lung disease can affect sexual intimacy. Written material given at  graduation..   Know Your Numbers and Heart Failure: - Group verbal and visual instruction to discuss disease risk factors for cardiac and pulmonary disease and treatment options.  Reviews associated critical values for Overweight/Obesity, Hypertension, Cholesterol, and Diabetes.  Discusses basics of heart failure: signs/symptoms and treatments.  Introduces Heart Failure Zone chart for action plan for heart failure.  Written material given at graduation.   Infection Prevention: - Provides verbal and written material to individual with discussion of infection control including proper hand washing and proper equipment cleaning during exercise session. Flowsheet Row Cardiac Rehab from 06/08/2021 in [facility] Cardiac and Pulmonary Rehab  Date 06/08/21  Educator [facility]  Instruction Review Code 1- Verbalizes Understanding       Falls Prevention: - Provides verbal and written material to individual with discussion of falls prevention and safety. Flowsheet Row Cardiac Rehab from 06/08/2021 in [facility] Cardiac and Pulmonary Rehab  Date 06/08/21  Educator [facility]  Instruction Review Code 1- Verbalizes Understanding       Other: -Provides group and verbal instruction on various topics (see comments)   Knowledge Questionnaire Score:  Knowledge Questionnaire Score - 06/08/21 1540       Knowledge Questionnaire Score   Pre Score 22/24 Education Focus: exercise, nutrition, angina             Core Components/Risk Factors/Patient Goals at Admission:  Personal Goals and Risk Factors at Admission - 06/08/21 1540       Core Components/Risk Factors/Patient Goals on Admission    Weight Management Yes;Weight Loss    Intervention Weight Management: Develop a combined nutrition and exercise program designed to reach desired caloric intake, while maintaining appropriate intake of nutrient and fiber, sodium and fats, and appropriate energy expenditure required for the weight goal.;Weight Management: Provide  education and appropriate resources to help participant work on and attain dietary goals.    Admit Weight 111 lb 4.8 oz (50.5 kg)    Goal Weight: Short Term 105 lb (47.6 kg)    Goal Weight: Long Term 105 lb (47.6 kg)    Expected Outcomes Short Term: Continue to assess and modify interventions until short term weight is achieved;Long Term: Adherence to nutrition and physical activity/exercise program aimed toward attainment of established weight goal;Weight Loss: Understanding of general recommendations for a balanced deficit meal plan, which promotes 1-2 lb weight loss per week and includes a negative energy balance of [phone] kcal/d;Understanding recommendations for meals to include 15-35% energy as protein, 25-35% energy from fat, 35-60% energy from carbohydrates, less than  of dietary cholesterol, 20-35 gm of total fiber daily;Understanding of distribution of calorie intake throughout the day with the consumption of 4-5 meals/snacks    Diabetes Yes    Intervention Provide education about signs/symptoms and action to take for  hypo/hyperglycemia.;Provide education about proper nutrition, including hydration, and aerobic/resistive exercise prescription along with prescribed medications to achieve blood glucose in normal ranges: Fasting glucose 65-99 mg/dL    Expected Outcomes Short Term: Participant verbalizes understanding of the signs/symptoms and immediate care of hyper/hypoglycemia, proper foot care and importance of medication, aerobic/resistive exercise and nutrition plan for blood glucose control.;Long Term: Attainment of HbA1C < 7%.    Heart Failure Yes    Intervention Provide a combined exercise and nutrition program that is supplemented with education, support and counseling about heart failure. Directed toward relieving symptoms such as shortness of breath, decreased exercise tolerance, and extremity edema.    Expected Outcomes Improve functional capacity of life;Short term: Attendance in  program 2-3 days a week with increased exercise capacity. Reported lower sodium intake. Reported increased fruit and vegetable intake. Reports medication compliance.;Short term: Daily weights obtained and reported for increase. Utilizing diuretic protocols set by physician.;Long term: Adoption of self-care skills and reduction of barriers for early signs and symptoms recognition and intervention leading to self-care maintenance.    Hypertension Yes    Intervention Provide education on lifestyle modifcations including regular physical activity/exercise, weight management, moderate sodium restriction and increased consumption of fresh fruit, vegetables, and low fat dairy, alcohol moderation, and smoking cessation.;Monitor prescription use compliance.    Expected Outcomes Long Term: Maintenance of blood pressure at goal levels.;Short Term: Continued assessment and intervention until BP is < 140/46mm HG in hypertensive participants. < 130/41mm HG in hypertensive participants with diabetes, heart failure or chronic kidney disease.    Lipids Yes    Intervention Provide education and support for participant on nutrition & aerobic/resistive exercise along with prescribed medications to achieve LDL 70mg , HDL >40mg .    Expected Outcomes Short Term: Participant states understanding of desired cholesterol values and is compliant with medications prescribed. Participant is following exercise prescription and nutrition guidelines.;Long Term: Cholesterol controlled with medications as prescribed, with individualized exercise RX and with personalized nutrition plan. Value goals: LDL < , HDL > 40 mg.             Education:Diabetes - Individual verbal and written instruction to review signs/symptoms of diabetes, desired ranges of glucose level fasting, after meals and with exercise. Acknowledge that pre and post exercise glucose checks will be done for 3 sessions at entry of program. Flowsheet Row Cardiac Rehab  from 06/08/2021 in [facility] Cardiac and Pulmonary Rehab  Date 06/08/21  Educator [facility]  Instruction Review Code 1- Verbalizes Understanding       Core Components/Risk Factors/Patient Goals Review:    Core Components/Risk Factors/Patient Goals at Discharge (Final Review):    ITP Comments:  ITP Comments     Row Name 06/07/21 1423 06/08/21 1533         ITP Comments Completed virtual orientation today.  EP evaluation is scheduled for Wed 7/13 at 1330.  Documentation for diagnosis can be found in [facility] encounter 05/15/2021. Completed and gym orientation. Initial ITP created and sent for review to Dr. Bethann Punches, Medical Director.               Comments: Initial ITP

## 2021-06-08 NOTE — Patient Instructions (Signed)
Patient Instructions  Patient Details  Name: Kylie Thomas MRN: [id number] Date of Birth: 05-17-1951 Referring Provider:  Lamar Blinks, MD  Below are your personal goals for exercise, nutrition, and risk factors. Our goal is to help you stay on track towards obtaining and maintaining these goals. We will be discussing your progress on these goals with you throughout the program.  Initial Exercise Prescription:  Initial Exercise Prescription - 06/08/21 1500       Date of Initial Exercise RX and Referring Provider   Date 06/08/21    Referring Provider [name] MD      Treadmill   MPH 2    Grade 1    Minutes 15    METs 2.81      Arm Ergometer   Level 1    Watts 25    RPM 30    Minutes 15    METs 2      REL-XR   Level 2    Speed 50    Minutes 15    METs 2      Track   Laps 29    Minutes 15    METs 2.58      Prescription Details   Frequency (times per week) 3    Duration Progress to 30 minutes of continuous aerobic without signs/symptoms of physical distress      Intensity   THRR 40-80% of Max Heartrate 98-137    Ratings of Perceived Exertion 11-13    Perceived Dyspnea 0-4      Progression   Progression Continue to progress workloads to maintain intensity without signs/symptoms of physical distress.      Resistance Training   Training Prescription Yes    Weight 3 lb    Reps 10-15             Exercise Goals: Frequency: Be able to perform aerobic exercise two to three times per week in program working toward 2-5 days per week of home exercise.  Intensity: Work with a perceived exertion of 11 (fairly light) - 15 (hard) while following your exercise prescription.  We will make changes to your prescription with you as you progress through the program.   Duration: Be able to do 30 to 45 minutes of continuous aerobic exercise in addition to a 5 minute warm-up and a 5 minute cool-down routine.   Nutrition Goals: Your personal nutrition goals will  be established when you do your nutrition analysis with the dietician.  The following are general nutrition guidelines to follow: Cholesterol < 200mg /day Sodium < 1500mg /day Fiber: Women over 50 yrs - 21 grams per day  Personal Goals:  Personal Goals and Risk Factors at Admission - 06/08/21 1540       Core Components/Risk Factors/Patient Goals on Admission    Weight Management Yes;Weight Loss    Intervention Weight Management: Develop a combined nutrition and exercise program designed to reach desired caloric intake, while maintaining appropriate intake of nutrient and fiber, sodium and fats, and appropriate energy expenditure required for the weight goal.;Weight Management: Provide education and appropriate resources to help participant work on and attain dietary goals.    Admit Weight 111 lb 4.8 oz (50.5 kg)    Goal Weight: Short Term 105 lb (47.6 kg)    Goal Weight: Long Term 105 lb (47.6 kg)    Expected Outcomes Short Term: Continue to assess and modify interventions until short term weight is achieved;Long Term: Adherence to nutrition and physical activity/exercise program aimed toward  attainment of established weight goal;Weight Loss: Understanding of general recommendations for a balanced deficit meal plan, which promotes 1-2 lb weight loss per week and includes a negative energy balance of [phone] kcal/d;Understanding recommendations for meals to include 15-35% energy as protein, 25-35% energy from fat, 35-60% energy from carbohydrates, less than 200mg  of dietary cholesterol, 20-35 gm of total fiber daily;Understanding of distribution of calorie intake throughout the day with the consumption of 4-5 meals/snacks    Diabetes Yes    Intervention Provide education about signs/symptoms and action to take for hypo/hyperglycemia.;Provide education about proper nutrition, including hydration, and aerobic/resistive exercise prescription along with prescribed medications to achieve blood glucose in  normal ranges: Fasting glucose 65-99 mg/dL    Expected Outcomes Short Term: Participant verbalizes understanding of the signs/symptoms and immediate care of hyper/hypoglycemia, proper foot care and importance of medication, aerobic/resistive exercise and nutrition plan for blood glucose control.;Long Term: Attainment of HbA1C < 7%.    Heart Failure Yes    Intervention Provide a combined exercise and nutrition program that is supplemented with education, support and counseling about heart failure. Directed toward relieving symptoms such as shortness of breath, decreased exercise tolerance, and extremity edema.    Expected Outcomes Improve functional capacity of life;Short term: Attendance in program 2-3 days a week with increased exercise capacity. Reported lower sodium intake. Reported increased fruit and vegetable intake. Reports medication compliance.;Short term: Daily weights obtained and reported for increase. Utilizing diuretic protocols set by physician.;Long term: Adoption of self-care skills and reduction of barriers for early signs and symptoms recognition and intervention leading to self-care maintenance.    Hypertension Yes    Intervention Provide education on lifestyle modifcations including regular physical activity/exercise, weight management, moderate sodium restriction and increased consumption of fresh fruit, vegetables, and low fat dairy, alcohol moderation, and smoking cessation.;Monitor prescription use compliance.    Expected Outcomes Long Term: Maintenance of blood pressure at goal levels.;Short Term: Continued assessment and intervention until BP is < 140/36mm HG in hypertensive participants. < 130/5mm HG in hypertensive participants with diabetes, heart failure or chronic kidney disease.    Lipids Yes    Intervention Provide education and support for participant on nutrition & aerobic/resistive exercise along with prescribed medications to achieve LDL 70mg , HDL >40mg .    Expected  Outcomes Short Term: Participant states understanding of desired cholesterol values and is compliant with medications prescribed. Participant is following exercise prescription and nutrition guidelines.;Long Term: Cholesterol controlled with medications as prescribed, with individualized exercise RX and with personalized nutrition plan. Value goals: LDL < 70mg , HDL > 40 mg.             Tobacco Use Initial Evaluation: Social History   Tobacco Use  Smoking Status Never  Smokeless Tobacco Never    Exercise Goals and Review:  Exercise Goals     Row Name 06/08/21 1536             Exercise Goals   Increase Physical Activity Yes       Intervention Provide advice, education, support and counseling about physical activity/exercise needs.;Develop an individualized exercise prescription for aerobic and resistive training based on initial evaluation findings, risk stratification, comorbidities and participant's personal goals.       Expected Outcomes Short Term: Attend rehab on a regular basis to increase amount of physical activity.;Long Term: Add in home exercise to make exercise part of routine and to increase amount of physical activity.;Long Term: Exercising regularly at least 3-5 days a week.  Increase Strength and Stamina Yes       Intervention Provide advice, education, support and counseling about physical activity/exercise needs.;Develop an individualized exercise prescription for aerobic and resistive training based on initial evaluation findings, risk stratification, comorbidities and participant's personal goals.       Expected Outcomes Short Term: Increase workloads from initial exercise prescription for resistance, speed, and METs.;Short Term: Perform resistance training exercises routinely during rehab and add in resistance training at home;Long Term: Improve cardiorespiratory fitness, muscular endurance and strength as measured by increased METs and functional capacity ( )        Able to understand and use rate of perceived exertion (RPE) scale Yes       Intervention Provide education and explanation on how to use RPE scale       Expected Outcomes Short Term: Able to use RPE daily in rehab to express subjective intensity level;Long Term:  Able to use RPE to guide intensity level when exercising independently       Able to understand and use Dyspnea scale Yes       Intervention Provide education and explanation on how to use Dyspnea scale       Expected Outcomes Short Term: Able to use Dyspnea scale daily in rehab to express subjective sense of shortness of breath during exertion;Long Term: Able to use Dyspnea scale to guide intensity level when exercising independently       Knowledge and understanding of Target Heart Rate Range (THRR) Yes       Intervention Provide education and explanation of THRR including how the numbers were predicted and where they are located for reference       Expected Outcomes Short Term: Able to state/look up THRR;Long Term: Able to use THRR to govern intensity when exercising independently;Short Term: Able to use daily as guideline for intensity in rehab       Able to check pulse independently Yes       Intervention Provide education and demonstration on how to check pulse in carotid and radial arteries.;Review the importance of being able to check your own pulse for safety during independent exercise       Expected Outcomes Long Term: Able to check pulse independently and accurately;Short Term: Able to explain why pulse checking is important during independent exercise       Understanding of Exercise Prescription Yes       Intervention Provide education, explanation, and written materials on patient's individual exercise prescription       Expected Outcomes Short Term: Able to explain program exercise prescription;Long Term: Able to explain home exercise prescription to exercise independently                Copy of goals given to  participant.

## 2021-06-15 ENCOUNTER — Other Ambulatory Visit: Payer: Self-pay

## 2021-06-15 DIAGNOSIS — Z5189 Encounter for other specified aftercare: Secondary | ICD-10-CM | POA: Diagnosis not present

## 2021-06-15 DIAGNOSIS — Z955 Presence of coronary angioplasty implant and graft: Secondary | ICD-10-CM

## 2021-06-15 DIAGNOSIS — I2121 ST elevation (STEMI) myocardial infarction involving left circumflex coronary artery: Secondary | ICD-10-CM

## 2021-06-15 LAB — GLUCOSE, CAPILLARY: Glucose-Capillary: 379 mg/dL — ABNORMAL HIGH (ref 70–99)

## 2021-06-15 NOTE — Progress Notes (Signed)
Incomplete Session Note  Patient Details  Name: Kylie Thomas MRN: [id number] Date of Birth: 1951/03/22 Referring Provider:   Flowsheet Row Cardiac Rehab from 06/08/2021 in [facility] Cardiac and Pulmonary Rehab  Referring Provider [name] MD       [name] did not complete her rehab session.  She was unable to exercise today due to elevated BG of 379. She has been in contact with her doctor and they are working on improving her medication regimen to address BG elevation. She was able to stay and attend the education class today.

## 2021-06-16 ENCOUNTER — Other Ambulatory Visit: Payer: Self-pay

## 2021-06-16 ENCOUNTER — Encounter: Payer: Medicare PPO | Admitting: *Deleted

## 2021-06-16 DIAGNOSIS — Z955 Presence of coronary angioplasty implant and graft: Secondary | ICD-10-CM

## 2021-06-16 DIAGNOSIS — I2121 ST elevation (STEMI) myocardial infarction involving left circumflex coronary artery: Secondary | ICD-10-CM

## 2021-06-16 DIAGNOSIS — Z5189 Encounter for other specified aftercare: Secondary | ICD-10-CM | POA: Diagnosis not present

## 2021-06-16 LAB — GLUCOSE, CAPILLARY
Glucose-Capillary: 178 mg/dL — ABNORMAL HIGH (ref 70–99)
Glucose-Capillary: 197 mg/dL — ABNORMAL HIGH (ref 70–99)

## 2021-06-16 NOTE — Progress Notes (Signed)
Daily Session Note  Patient Details  Name: Kylie Thomas MRN: [id number] Date of Birth: [birth year] Referring Provider:   Flowsheet Row Cardiac Rehab from 06/08/2021 in [facility] Cardiac and Pulmonary Rehab  Referring Provider Serafina Royals MD       Encounter Date: 06/16/2021  Check In:  Session Check In - 06/16/21 1542       Check-In   Supervising physician immediately available to respond to emergencies See telemetry face sheet for immediately available ER MD    Location [facility]-Cardiac & Pulmonary Rehab    Staff Present Renita Papa, RN BSN;Joseph Bristow, RCP,RRT,BSRT;Jessica Adelino, [place], RCEP, CCRP, CCET    Virtual Visit No    Medication changes reported     No    Fall or balance concerns reported    No    Warm-up and Cool-down Performed on first and last piece of equipment    Resistance Training Performed Yes    VAD Patient? No    PAD/SET Patient? No      Pain Assessment   Currently in Pain? No/denies                Social History   Tobacco Use  Smoking Status Never  Smokeless Tobacco Never    Goals Met:  Independence with exercise equipment Exercise tolerated well No report of cardiac concerns or symptoms Strength training completed today  Goals Unmet:  Not Applicable  Comments: First full day of exercise!  Patient was oriented to gym and equipment including functions, settings, policies, and procedures.  Patient's individual exercise prescription and treatment plan were reviewed.  All starting workloads were established based on the results of the 6 minute walk test done at initial orientation visit.  The plan for exercise progression was also introduced and progression will be customized based on patient's performance and goals.    Dr. Emily Filbert is Medical Director for [place].  Dr. Ottie Glazier is Medical Director for [facility] Pulmonary Rehabilitation.

## 2021-06-22 ENCOUNTER — Other Ambulatory Visit: Payer: Self-pay

## 2021-06-22 ENCOUNTER — Encounter: Payer: Medicare PPO | Admitting: *Deleted

## 2021-06-22 DIAGNOSIS — I2121 ST elevation (STEMI) myocardial infarction involving left circumflex coronary artery: Secondary | ICD-10-CM

## 2021-06-22 DIAGNOSIS — Z955 Presence of coronary angioplasty implant and graft: Secondary | ICD-10-CM

## 2021-06-22 DIAGNOSIS — Z5189 Encounter for other specified aftercare: Secondary | ICD-10-CM | POA: Diagnosis not present

## 2021-06-22 LAB — GLUCOSE, CAPILLARY
Glucose-Capillary: 241 mg/dL — ABNORMAL HIGH (ref 70–99)
Glucose-Capillary: 152 mg/dL — ABNORMAL HIGH (ref 70–99)

## 2021-06-22 NOTE — Progress Notes (Signed)
Daily Session Note  Patient Details  Name: Kylie Thomas MRN: [id number] Date of Birth: 29-Jun-1951 Referring Provider:   Flowsheet Row Cardiac Rehab from 06/08/2021 in [facility] Cardiac and Pulmonary Rehab  Referring Provider Serafina Royals MD       Encounter Date: 06/22/2021  Check In:  Session Check In - 06/22/21 1544       Check-In   Supervising physician immediately available to respond to emergencies See telemetry face sheet for immediately available ER MD    Location [facility]-Cardiac & Pulmonary Rehab    Staff Present Renita Papa, RN BSN;Joseph Destin, RCP,RRT,BSRT;Kara Thorndale, [place], ASCM CEP, Exercise Physiologist    Virtual Visit No    Medication changes reported     No    Fall or balance concerns reported    No    Warm-up and Cool-down Performed on first and last piece of equipment    Resistance Training Performed Yes    VAD Patient? No    PAD/SET Patient? No      Pain Assessment   Currently in Pain? No/denies                Social History   Tobacco Use  Smoking Status Never  Smokeless Tobacco Never    Goals Met:  Independence with exercise equipment Exercise tolerated well No report of cardiac concerns or symptoms Strength training completed today  Goals Unmet:  Not Applicable  Comments: Pt able to follow exercise prescription today without complaint.  Will continue to monitor for progression.    Dr. Emily Filbert is Medical Director for [place].  Dr. Ottie Glazier is Medical Director for [facility] Pulmonary Rehabilitation.

## 2021-06-23 ENCOUNTER — Other Ambulatory Visit: Payer: Self-pay

## 2021-06-23 ENCOUNTER — Encounter: Payer: Medicare PPO | Admitting: *Deleted

## 2021-06-23 DIAGNOSIS — I2121 ST elevation (STEMI) myocardial infarction involving left circumflex coronary artery: Secondary | ICD-10-CM

## 2021-06-23 DIAGNOSIS — Z955 Presence of coronary angioplasty implant and graft: Secondary | ICD-10-CM

## 2021-06-23 DIAGNOSIS — Z5189 Encounter for other specified aftercare: Secondary | ICD-10-CM | POA: Diagnosis not present

## 2021-06-23 NOTE — Progress Notes (Signed)
Daily Session Note  Patient Details  Name: Kylie Thomas MRN: [id number] Date of Birth: 03/06/1951 Referring Provider:   Flowsheet Row Cardiac Rehab from 06/08/2021 in [facility] Cardiac and Pulmonary Rehab  Referring Provider Serafina Royals MD       Encounter Date: 06/23/2021  Check In:  Session Check In - 06/23/21 1553       Check-In   Supervising physician immediately available to respond to emergencies See telemetry face sheet for immediately available ER MD    Location [facility]-Cardiac & Pulmonary Rehab    Staff Present Renita Papa, RN BSN;Joseph Tessie Fass, RCP,RRT,BSRT;Melissa Cayce, [place], LDN    Virtual Visit No    Medication changes reported     No    Fall or balance concerns reported    No    Warm-up and Cool-down Performed on first and last piece of equipment    Resistance Training Performed Yes    VAD Patient? No    PAD/SET Patient? No      Pain Assessment   Currently in Pain? No/denies                Social History   Tobacco Use  Smoking Status Never  Smokeless Tobacco Never    Goals Met:  Independence with exercise equipment Exercise tolerated well No report of cardiac concerns or symptoms Strength training completed today  Goals Unmet:  Not Applicable  Comments: Pt able to follow exercise prescription today without complaint.  Will continue to monitor for progression.    Dr. Emily Filbert is Medical Director for [place].  Dr. Ottie Glazier is Medical Director for [facility] Pulmonary Rehabilitation.

## 2021-06-27 ENCOUNTER — Encounter: Payer: Medicare PPO | Attending: Internal Medicine

## 2021-06-27 ENCOUNTER — Other Ambulatory Visit: Payer: Self-pay

## 2021-06-27 DIAGNOSIS — Z955 Presence of coronary angioplasty implant and graft: Secondary | ICD-10-CM

## 2021-06-27 DIAGNOSIS — I252 Old myocardial infarction: Secondary | ICD-10-CM | POA: Insufficient documentation

## 2021-06-27 DIAGNOSIS — E119 Type 2 diabetes mellitus without complications: Secondary | ICD-10-CM | POA: Diagnosis not present

## 2021-06-27 DIAGNOSIS — I2121 ST elevation (STEMI) myocardial infarction involving left circumflex coronary artery: Secondary | ICD-10-CM

## 2021-06-27 NOTE — Progress Notes (Signed)
Daily Session Note  Patient Details  Name: Kylie Thomas MRN: [id number] Date of Birth: Jul 26, 1951 Referring Provider:   Flowsheet Row Cardiac Rehab from 06/08/2021 in [facility] Cardiac and Pulmonary Rehab  Referring Provider Serafina Royals MD       Encounter Date: 06/27/2021  Check In:  Session Check In - 06/27/21 1552       Check-In   Supervising physician immediately available to respond to emergencies See telemetry face sheet for immediately available ER MD    Location [facility]-Cardiac & Pulmonary Rehab    Staff Present Birdie Sons, MPA, Nino Glow, MS, ASCM CEP, Exercise Physiologist;Joseph Tessie Fass, [place]    Virtual Visit No    Medication changes reported     No    Fall or balance concerns reported    No    Warm-up and Cool-down Performed on first and last piece of equipment    Resistance Training Performed Yes    VAD Patient? No    PAD/SET Patient? No      Pain Assessment   Currently in Pain? No/denies                Social History   Tobacco Use  Smoking Status Never  Smokeless Tobacco Never    Goals Met:  Independence with exercise equipment Exercise tolerated well No report of cardiac concerns or symptoms Strength training completed today  Goals Unmet:  Not Applicable  Comments: Pt able to follow exercise prescription today without complaint.  Will continue to monitor for progression.    Dr. Emily Filbert is Medical Director for [place].  Dr. Ottie Glazier is Medical Director for [facility] Pulmonary Rehabilitation.

## 2021-06-29 ENCOUNTER — Other Ambulatory Visit: Payer: Self-pay

## 2021-06-29 DIAGNOSIS — I252 Old myocardial infarction: Secondary | ICD-10-CM | POA: Diagnosis not present

## 2021-06-29 DIAGNOSIS — I2121 ST elevation (STEMI) myocardial infarction involving left circumflex coronary artery: Secondary | ICD-10-CM

## 2021-06-29 DIAGNOSIS — Z955 Presence of coronary angioplasty implant and graft: Secondary | ICD-10-CM

## 2021-06-29 LAB — GLUCOSE, CAPILLARY: Glucose-Capillary: 98 mg/dL (ref 70–99)

## 2021-06-29 NOTE — Progress Notes (Signed)
Incomplete Session Note  Patient Details  Name: Kylie Thomas MRN: [id number] Date of Birth: Dec 28, 1950 Referring Provider:   Flowsheet Row Cardiac Rehab from 06/08/2021 in [facility] Card[facility] Rehab  Referring Provider [name] MD       [name] did not complete her rehab session. Khamora came into rehab late today and wanted [place] to check her blood sugar - result was 98; our standards of care require her to have a blood sugar of at least 110 to exercise. She had been having trouble with her blood sugar earlier that day at [facility]; she had taken 3 glucose tablets and juice at 230 pm. She reported [place] a tuna sandwich [place] [employer] at 1130 am and took 7 units of insulin. She said she felt dizzy leaving the parking lot and at the gas station before arriving at rehab. Educated patient on proper low blood sugar protocol: 15 g of fast acting carbohydrates (provided examples), wait 15 minutes, check blood sugar and repeat until blood sugar is normal. Following this she should have a snack or meal with carbohydrates, protein, and fat to ensure blood sugar stays stable. We provided Kamaya with a protein and carbohydrate snack and sent her home as she was feeling better and her BP was stable.

## 2021-06-30 ENCOUNTER — Other Ambulatory Visit: Payer: Self-pay

## 2021-06-30 ENCOUNTER — Encounter: Payer: Medicare PPO | Admitting: *Deleted

## 2021-06-30 DIAGNOSIS — I2121 ST elevation (STEMI) myocardial infarction involving left circumflex coronary artery: Secondary | ICD-10-CM

## 2021-06-30 DIAGNOSIS — I252 Old myocardial infarction: Secondary | ICD-10-CM | POA: Diagnosis not present

## 2021-06-30 DIAGNOSIS — Z955 Presence of coronary angioplasty implant and graft: Secondary | ICD-10-CM

## 2021-06-30 LAB — GLUCOSE, CAPILLARY
Glucose-Capillary: 52 mg/dL — ABNORMAL LOW (ref 70–99)
Glucose-Capillary: 81 mg/dL (ref 70–99)

## 2021-06-30 NOTE — Progress Notes (Signed)
Incomplete Session Note  Patient Details  Name: KYLEAH PENSABENE MRN: [id number] Date of Birth: [birth year] Referring Provider:   Flowsheet Row Cardiac Rehab from 06/08/2021 in [facility] Cardiac and Pulmonary Rehab  Referring Provider [name] MD       [name] did not complete her rehab session.  She felt her sugar dropping and it read 48 when she checked it. She drank her juice and took two glucose tablets, our recheck read 52. More glucose tabs were given and recheck read 81. A protein snack was given and more education on managing low readings. She has been working with her MD on a medication regimen. Staff encouraged her to keep a log of her meals and insulin given and to reach out to her doctor if this keeps happening.

## 2021-07-06 ENCOUNTER — Other Ambulatory Visit: Payer: Self-pay

## 2021-07-06 ENCOUNTER — Inpatient Hospital Stay: Admit: 2021-07-06 | Payer: Medicare HMO

## 2021-07-06 ENCOUNTER — Encounter: Payer: Self-pay | Admitting: *Deleted

## 2021-07-06 DIAGNOSIS — Z955 Presence of coronary angioplasty implant and graft: Secondary | ICD-10-CM

## 2021-07-06 DIAGNOSIS — I2121 ST elevation (STEMI) myocardial infarction involving left circumflex coronary artery: Secondary | ICD-10-CM

## 2021-07-06 DIAGNOSIS — I252 Old myocardial infarction: Secondary | ICD-10-CM | POA: Diagnosis not present

## 2021-07-06 LAB — GLUCOSE, CAPILLARY: Glucose-Capillary: 131 mg/dL — ABNORMAL HIGH (ref 70–99)

## 2021-07-06 NOTE — Progress Notes (Signed)
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 [facility])  Status post coronary artery stent placement  Patient's Home Medications on Admission:  Current Outpatient Medications:    amiodarone (PACERONE) 200 MG tablet, Take 200 mg by mouth daily., Disp: , Rfl:    amiodarone (PACERONE) 200 MG tablet, Take 1 tablet by mouth daily., Disp: , Rfl:    apixaban (ELIQUIS) 5 MG TABS tablet, Take by mouth., Disp: , Rfl:    atorvastatin (LIPITOR) 10 MG tablet, Take 1 tablet by mouth daily., Disp: , Rfl:    clopidogrel (PLAVIX) 75 MG tablet, Take 75 mg by mouth daily., Disp: , Rfl:    clopidogrel (PLAVIX) 75 MG tablet, Take 4 tablets (300 mg) by mouth once this evening (04/28/21) followed by 75 mg daily starting on Friday 04/29/21., Disp: , Rfl:    dicyclomine (BENTYL) 10 MG capsule, Take 10 mg by mouth 3 (three) times daily., Disp: , Rfl:    dorzolamide-timolol (COSOPT) 22.3-6.8 MG/ML ophthalmic solution, Place 1 drop into both eyes 2 (two) times daily., Disp: , Rfl: 0   DULoxetine (CYMBALTA) 60 MG capsule, Take 60 mg by mouth daily., Disp: , Rfl: 5   DULoxetine (CYMBALTA) 60 MG capsule, Take 1 capsule by mouth daily., Disp: , Rfl:    ELIQUIS 5 MG TABS tablet, Take 5 mg by mouth 2 (two) times daily., Disp: , Rfl:    furosemide (LASIX) 20 MG tablet, Take 3 tablets (60 mg total) by mouth 2 (two) times daily., Disp: 180 tablet, Rfl: 0   furosemide (LASIX) 20 MG tablet, Take by mouth., Disp: , Rfl:    gabapentin (NEURONTIN) 300 MG capsule, Take 300 mg by mouth daily as needed.,  Disp: , Rfl:    gabapentin (NEURONTIN) 300 MG capsule, Take 1 capsule by mouth daily as needed., Disp: , Rfl:    glucose blood test strip, OneTouch Ultra Blue Test Strip, Disp: , Rfl:    hyoscyamine (LEVBID) 0.375 MG 12 hr tablet, Take by mouth., Disp: , Rfl:    insulin aspart (NOVOLOG) 100 UNIT/ML injection, Inject 4 Units into the skin with breakfast, with lunch, and with evening meal. And sliding scale, Disp: , Rfl:    insulin glargine (LANTUS) 100 UNIT/ML injection, Inject 10 Units into the skin daily., Disp: , Rfl:    isosorbide mononitrate (IMDUR) 30 MG 24 hr tablet, Take 2 tablets (60 mg total) by mouth daily. (Patient not taking: Reported on 06/07/2021), Disp: 30 tablet, Rfl: 0   loratadine (CLARITIN) 10 MG tablet, Take 10 mg by mouth daily as needed for allergies., Disp: , Rfl:    losartan (COZAAR) 25 MG tablet, Take 12.5 mg by mouth every morning. (Patient not taking: No sig reported), Disp: , Rfl:    magnesium oxide (MAG-OX) 400 MG tablet, Take 400 mg by mouth daily., Disp: , Rfl:    metoprolol succinate (TOPROL-XL) 50 MG 24 hr tablet, Take 50 mg by mouth 2 (two) times daily., Disp: , Rfl:    metoprolol succinate (TOPROL-XL) 50 MG 24 hr tablet, Take by mouth., Disp: , Rfl:  mirtazapine (REMERON) 7.5 MG tablet, Take 7.5 mg by mouth at bedtime., Disp: , Rfl:    mirtazapine (REMERON) 7.5 MG tablet, Take 1 tablet by mouth 2 (two) times daily., Disp: , Rfl:    nitroGLYCERIN (NITROSTAT) 0.4 MG SL tablet, Place 1 tablet (0.4 mg total) under the tongue every 5 (five) minutes as needed., Disp: 20 tablet, Rfl: 3   nitroGLYCERIN (NITROSTAT) 0.4 MG SL tablet, Place under the tongue., Disp: , Rfl:    pantoprazole (PROTONIX) 20 MG tablet, Take 20 mg by mouth daily., Disp: , Rfl:    pantoprazole (PROTONIX) 20 MG tablet, Take by mouth., Disp: , Rfl:    rosuvastatin (CRESTOR) 40 MG tablet, Take 40 mg by mouth every morning., Disp: , Rfl:   Past Medical History: Past Medical History:  Diagnosis Date    Anxiety    Arrhythmia    Atrial fibrillation (HCC)    CHF (congestive heart failure) (HCC)    Coronary artery disease    Depression    Diabetes mellitus without complication (HCC)    GERD (gastroesophageal reflux disease)    Hyperlipidemia    Hypertension     Tobacco Use: Social History   Tobacco Use  Smoking Status Never  Smokeless Tobacco Never    Labs: Recent Review Flowsheet Data     Labs for ITP Cardiac and Pulmonary Rehab Latest Ref Rng & Units 02/16/2019 05/16/2021   Cholestrol 0 - 200 mg/dL - 95   LDLCALC 0 - 99 mg/dL - 45   HDL >16 mg/dL - 10(R)   Trlycerides <604 mg/dL - 52   HCO3 54.0 - 98.1 mmol/L 23.7 -        Exercise Target Goals: Exercise Program Goal: Individual exercise prescription set using results from initial 6 min walk test and THRR while considering  patient's activity barriers and safety.   Exercise Prescription Goal: Initial exercise prescription builds to 30-45 minutes a day of aerobic activity, 2-3 days per week.  Home exercise guidelines will be given to patient during program as part of exercise prescription that the participant will acknowledge.   Education: Aerobic Exercise: - Group verbal and visual presentation on the components of exercise prescription. Introduces F.I.T.T principle from ACSM for exercise prescriptions.  Reviews F.I.T.T. principles of aerobic exercise including progression. Written material given at graduation. Flowsheet Row Cardiac Rehab from 06/22/2021 in [facility] Cardiac and Pulmonary Rehab  Education need identified 06/08/21       Education: Resistance Exercise: - Group verbal and visual presentation on the components of exercise prescription. Introduces F.I.T.T principle from ACSM for exercise prescriptions  Reviews F.I.T.T. principles of resistance exercise including progression. Written material given at graduation.    Education: Exercise & Equipment Safety: - Individual verbal instruction and demonstration  of equipment use and safety with use of the equipment. Flowsheet Row Cardiac Rehab from 06/22/2021 in [facility] Cardiac and Pulmonary Rehab  Date 06/08/21  Educator [facility]  Instruction Review Code 1- Verbalizes Understanding       Education: Exercise Physiology & General Exercise Guidelines: - Group verbal and written instruction with models to review the exercise physiology of the cardiovascular system and associated critical values. Provides general exercise guidelines with specific guidelines to those with heart or lung disease.    Education: Flexibility, Balance, Mind/Body Relaxation: - Group verbal and visual presentation with interactive activity on the components of exercise prescription. Introduces F.I.T.T principle from ACSM for exercise prescriptions. Reviews F.I.T.T. principles of flexibility and balance exercise training including progression. Also discusses the  mind body connection.  Reviews various relaxation techniques to help reduce and manage stress (i.e. Deep breathing, progressive muscle relaxation, and visualization). Balance handout provided to take home. Written material given at graduation.   Activity Barriers & Risk Stratification:  Activity Barriers & Cardiac Risk Stratification - 06/08/21 1534       Activity Barriers & Cardiac Risk Stratification   Activity Barriers History of Falls;Balance Concerns;Deconditioning;Muscular Weakness;Shortness of Breath    Cardiac Risk Stratification High             6 Minute Walk:  6 Minute Walk     Row Name 06/08/21 1534         6 Minute Walk   Phase Initial     Distance 1030 feet     Walk Time 6 minutes     # of Rest Breaks 0     MPH 1.95     METS 2.6     RPE 10     Perceived Dyspnea  1     VO2 Peak 9.1     Symptoms Yes (comment)     Comments SOB     Resting HR 63 bpm     Resting BP 122/70     Resting Oxygen Saturation  99 %     Exercise Oxygen Saturation  during 6 min walk 99 %     Max Ex. HR 97 bpm     Max Ex.  BP 128/74     2 Minute Post BP 126/64              Oxygen Initial Assessment:   Oxygen Re-Evaluation:   Oxygen Discharge (Final Oxygen Re-Evaluation):   Initial Exercise Prescription:  Initial Exercise Prescription - 06/08/21 1500       Date of Initial Exercise RX and Referring Provider   Date 06/08/21    Referring Provider [name] MD      Treadmill   MPH 2    Grade 1    Minutes 15    METs 2.81      Arm Ergometer   Level 1    Watts 25    RPM 30    Minutes 15    METs 2      REL-XR   Level 2    Speed 50    Minutes 15    METs 2      Track   Laps 29    Minutes 15    METs 2.58      Prescription Details   Frequency (times per week) 3    Duration Progress to 30 minutes of continuous aerobic without signs/symptoms of physical distress      Intensity   THRR 40-80% of Max Heartrate 98-137    Ratings of Perceived Exertion 11-13    Perceived Dyspnea 0-4      Progression   Progression Continue to progress workloads to maintain intensity without signs/symptoms of physical distress.      Resistance Training   Training Prescription Yes    Weight 3 lb    Reps 10-15             Perform Capillary Blood Glucose checks as needed.  Exercise Prescription Changes:   Exercise Prescription Changes     Row Name 06/08/21 1500 06/28/21 0900           Response to Exercise   Blood Pressure (Admit) 122/70 104/58      Blood Pressure (Exercise) 128/74 126/60      Blood  Pressure (Exit) 126/64 104/56      Heart Rate (Admit) 63 bpm 70 bpm      Heart Rate (Exercise) 97 bpm 89 bpm      Heart Rate (Exit) 68 bpm 70 bpm      Oxygen Saturation (Admit) 99 % --      Oxygen Saturation (Exercise) 99 % --      Rating of Perceived Exertion (Exercise) 10 15      Perceived Dyspnea (Exercise) 1 --      Symptoms SOB none      Comments walk test results --      Duration -- Continue with 30 min of aerobic exercise without signs/symptoms of physical distress.       Intensity -- THRR unchanged             Progression      Progression -- Continue to progress workloads to maintain intensity without signs/symptoms of physical distress.      Average METs -- 2.28             Resistance Training      Training Prescription -- Yes      Weight -- 3 lb      Reps -- 10-15             Interval Training      Interval Training -- No             Treadmill      MPH -- 2      Grade -- 1      Minutes -- 15      METs -- 2.81             Arm Ergometer      Level -- 1      Minutes -- 15             REL-XR      Level -- 2      Minutes -- 15      METs -- 3.4             Track      Laps -- 13      Minutes -- 15      METs -- 1.71              Exercise Comments:   Exercise Comments     Row Name 06/15/21 1616           Exercise Comments Kylie Thomas did not complete her rehab session.  She was unable to exercise today due to elevated BG of 379. She has been in contact with her doctor and they are working on improving her medication regimen to address BG elevation. She was able to stay and attend the education class today.                Exercise Goals and Review:   Exercise Goals     Row Name 06/08/21 1536             Exercise Goals   Increase Physical Activity Yes       Intervention Provide advice, education, support and counseling about physical activity/exercise needs.;Develop an individualized exercise prescription for aerobic and resistive training based on initial evaluation findings, risk stratification, comorbidities and participant's personal goals.       Expected Outcomes Short Term: Attend rehab on a regular basis to increase amount of physical activity.;Long Term: Add in home exercise to make exercise part of routine and  to increase amount of physical activity.;Long Term: Exercising regularly at least 3-5 days a week.       Increase Strength and Stamina Yes       Intervention Provide advice, education, support and  counseling about physical activity/exercise needs.;Develop an individualized exercise prescription for aerobic and resistive training based on initial evaluation findings, risk stratification, comorbidities and participant's personal goals.       Expected Outcomes Short Term: Increase workloads from initial exercise prescription for resistance, speed, and METs.;Short Term: Perform resistance training exercises routinely during rehab and add in resistance training at home;Long Term: Improve cardiorespiratory fitness, muscular endurance and strength as measured by increased METs and functional capacity ( )       Able to understand and use rate of perceived exertion (RPE) scale Yes       Intervention Provide education and explanation on how to use RPE scale       Expected Outcomes Short Term: Able to use RPE daily in rehab to express subjective intensity level;Long Term:  Able to use RPE to guide intensity level when exercising independently       Able to understand and use Dyspnea scale Yes       Intervention Provide education and explanation on how to use Dyspnea scale       Expected Outcomes Short Term: Able to use Dyspnea scale daily in rehab to express subjective sense of shortness of breath during exertion;Long Term: Able to use Dyspnea scale to guide intensity level when exercising independently       Knowledge and understanding of Target Heart Rate Range (THRR) Yes       Intervention Provide education and explanation of THRR including how the numbers were predicted and where they are located for reference       Expected Outcomes Short Term: Able to state/look up THRR;Long Term: Able to use THRR to govern intensity when exercising independently;Short Term: Able to use daily as guideline for intensity in rehab       Able to check pulse independently Yes       Intervention Provide education and demonstration on how to check pulse in carotid and radial arteries.;Review the importance of being able to  check your own pulse for safety during independent exercise       Expected Outcomes Long Term: Able to check pulse independently and accurately;Short Term: Able to explain why pulse checking is important during independent exercise       Understanding of Exercise Prescription Yes       Intervention Provide education, explanation, and written materials on patient's individual exercise prescription       Expected Outcomes Short Term: Able to explain program exercise prescription;Long Term: Able to explain home exercise prescription to exercise independently                Exercise Goals Re-Evaluation :  Exercise Goals Re-Evaluation     Row Name 06/16/21 1547 06/28/21 0857           Exercise Goal Re-Evaluation   Exercise Goals Review Increase Physical Activity;Able to understand and use rate of perceived exertion (RPE) scale;Knowledge and understanding of Target Heart Rate Range (THRR);Understanding of Exercise Prescription;Increase Strength and Stamina;Able to check pulse independently Increase Physical Activity;Increase Strength and Stamina;Understanding of Exercise Prescription      Comments Reviewed RPE and dyspnea scales, THR and program prescription with pt today.  Pt voiced understanding and was given a copy of goals to take home. Kylie Thomas is off to a  good start in rehab.  Her attendance is dependent on her blood sugars and we have encouraged her to talk with her doctor for improved control.  She only got 13 laps on track and we will encourage to walk faster on track as she is at 2.0 mph on treadmill which would be equal to 28 laps.  We will continue to monitor her progress.      Expected Outcomes Short: Use RPE daily to regulate intensity. Long: Follow program prescription in THR. Short: Continue to attend regularly and walk faster Long: Continue to follow program prescription               Discharge Exercise Prescription (Final Exercise Prescription Changes):  Exercise Prescription  Changes - 06/28/21 0900       Response to Exercise   Blood Pressure (Admit) 104/58    Blood Pressure (Exercise) 126/60    Blood Pressure (Exit) 104/56    Heart Rate (Admit) 70 bpm    Heart Rate (Exercise) 89 bpm    Heart Rate (Exit) 70 bpm    Rating of Perceived Exertion (Exercise) 15    Symptoms none    Duration Continue with 30 min of aerobic exercise without signs/symptoms of physical distress.    Intensity THRR unchanged      Progression   Progression Continue to progress workloads to maintain intensity without signs/symptoms of physical distress.    Average METs 2.28      Resistance Training   Training Prescription Yes    Weight 3 lb    Reps 10-15      Interval Training   Interval Training No      Treadmill   MPH 2    Grade 1    Minutes 15    METs 2.81      Arm Ergometer   Level 1    Minutes 15      REL-XR   Level 2    Minutes 15    METs 3.4      Track   Laps 13    Minutes 15    METs 1.71             Nutrition:  Target Goals: Understanding of nutrition guidelines, daily intake of sodium 1500mg , cholesterol 200mg , calories 30% from fat and 7% or less from saturated fats, daily to have 5 or more servings of fruits and vegetables.  Education: All About Nutrition: -Group instruction provided by verbal, written material, interactive activities, discussions, models, and posters to present general guidelines for heart healthy nutrition including fat, fiber, MyPlate, the role of sodium in heart healthy nutrition, utilization of the nutrition label, and utilization of this knowledge for meal planning. Follow up email sent as well. Written material given at graduation. Flowsheet Row Cardiac Rehab from 06/22/2021 in [facility] Cardiac and Pulmonary Rehab  Education need identified 06/08/21       Biometrics:  Pre Biometrics - 06/08/21 1536       Pre Biometrics   Height 5' 1.5" (1.562 m)    Weight 111 lb 4.8 oz (50.5 kg)    BMI (Calculated) 20.69    Single  Leg Stand 12 seconds              Nutrition Therapy Plan and Nutrition Goals:  Nutrition Therapy & Goals - 06/07/21 1414       Intervention Plan   Intervention Prescribe, educate and counsel regarding individualized specific dietary modifications aiming towards targeted core components such as weight, hypertension, lipid management,  diabetes, heart failure and other comorbidities.    Expected Outcomes Short Term Goal: Understand basic principles of dietary content, such as calories, fat, sodium, cholesterol and nutrients.;Short Term Goal: A plan has been developed with personal nutrition goals set during dietitian appointment.;Long Term Goal: Adherence to prescribed nutrition plan.             Nutrition Assessments:  MEDIFICTS Score Key: ?70 Need to make dietary changes  40-70 Heart Healthy Diet ? 40 Therapeutic Level Cholesterol Diet  Flowsheet Row Cardiac Rehab from 06/08/2021 in [facility] Cardiac and Pulmonary Rehab  Picture Your Plate Total Score on Admission 74      Picture Your Plate Scores: <85 Unhealthy dietary pattern with much room for improvement. 41-50 Dietary pattern unlikely to meet recommendations for good health and room for improvement. 51-60 More healthful dietary pattern, with some room for improvement.  >60 Healthy dietary pattern, although there may be some specific behaviors that could be improved.    Nutrition Goals Re-Evaluation:   Nutrition Goals Discharge (Final Nutrition Goals Re-Evaluation):   Psychosocial: Target Goals: Acknowledge presence or absence of significant depression and/or stress, maximize coping skills, provide positive support system. Participant is able to verbalize types and ability to use techniques and skills needed for reducing stress and depression.   Education: Stress, Anxiety, and Depression - Group verbal and visual presentation to define topics covered.  Reviews how body is impacted by stress, anxiety, and depression.   Also discusses healthy ways to reduce stress and to treat/manage anxiety and depression.  Written material given at graduation.   Education: Sleep Hygiene -Provides group verbal and written instruction about how sleep can affect your health.  Define sleep hygiene, discuss sleep cycles and impact of sleep habits. Review good sleep hygiene tips.    Initial Review & Psychosocial Screening:  Initial Psych Review & Screening - 06/07/21 1416       Initial Review   Current issues with Current Anxiety/Panic;Current Depression;History of Depression;Current Psychotropic Meds;Current Stress Concerns    Source of Stress Concerns Family;Financial    Comments ongoing divorce issues, trying to sell house, trying to separate finances (his mother is hiding money and funds)      Family Dynamics   Good Support System? Yes   son and daughter, neighbors nearby   Comments Still suffering from her divorce 10 yrs.  She has been coughing for 2 years and still unknown cause, Currently on anxiety meds      Barriers   Psychosocial barriers to participate in program There are no identifiable barriers or psychosocial needs.;The patient should benefit from training in stress management and relaxation.      Screening Interventions   Interventions Encouraged to exercise;To provide support and resources with identified psychosocial needs;Provide feedback about the scores to participant    Expected Outcomes Short Term goal: Utilizing psychosocial counselor, staff and physician to assist with identification of specific Stressors or current issues interfering with healing process. Setting desired goal for each stressor or current issue identified.;Long Term Goal: Stressors or current issues are controlled or eliminated.;Short Term goal: Identification and review with participant of any Quality of Life or Depression concerns found by scoring the questionnaire.;Long Term goal: The participant improves quality of Life and PHQ9  Scores as seen by post scores and/or verbalization of changes             Quality of Life Scores:   Quality of Life - 06/08/21 1539       Quality of Life  Select Quality of Life      Quality of Life Scores   Health/Function Pre 16.64 %    Socioeconomic Pre 19.79 %    Psych/Spiritual Pre 21.79 %    Family Pre 25.5 %    GLOBAL Pre 19.56 %            Scores of 19 and below usually indicate a poorer quality of life in these areas.  A difference of  2-3 points is a clinically meaningful difference.  A difference of 2-3 points in the total score of the Quality of Life Index has been associated with significant improvement in overall quality of life, self-image, physical symptoms, and general health in studies assessing change in quality of life.  PHQ-9: Recent Review Flowsheet Data     Depression screen [facility] 06/08/2021 06/01/2021   Decreased Interest 1 0   Down, Depressed, Hopeless 1 0   PHQ - 2 Score 2 0   Altered sleeping 2 -   Tired, decreased energy 2 -   Change in appetite 2 -   Feeling bad or failure about yourself  2 -   Trouble concentrating 0 -   Moving slowly or fidgety/restless 1 -   Suicidal thoughts 0 -   PHQ-9 Score 11 -   Difficult doing work/chores Somewhat difficult -      Interpretation of Total Score  Total Score Depression Severity:  1-4 = Minimal depression, 5-9 = Mild depression, 10-14 = Moderate depression, 15-19 = Moderately severe depression, 20-27 = Severe depression   Psychosocial Evaluation and Intervention:  Psychosocial Evaluation - 06/07/21 1424       Psychosocial Evaluation & Interventions   Interventions Stress management education;Encouraged to exercise with the program and follow exercise prescription    Comments Kylie BernJean is 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  She is eager to get started to be able to keep with her kids.  She wants to get better to be able to walk around for shopping.  She has a history of depression and  anxiety.  A lot is steming from her on going divorce for 10 years. They keep discovering more funds to argue over.  She is due in court in August to finalize things.  She is a little hesistant to trust doctors as she has been getting the run around with her cough for two years. She also has been having some dizzy spells Her sugars are out of control ranging any where between 400-44 mg/dl.  We talked about trying to get better control while here.  Her son is an exercise science major and eager to her get better as calls to check on her rouinely.  She also has great support from her neighbors who stop by to check in too.    Expected Outcomes Short: Attend rehab to regain strength Long; continue to focus on the good that surrounds her.             Psychosocial Re-Evaluation:   Psychosocial Discharge (Final Psychosocial Re-Evaluation):   Vocational Rehabilitation: Provide vocational rehab assistance to qualifying candidates.   Vocational Rehab Evaluation & Intervention:  Vocational Rehab - 06/07/21 1414       Initial Vocational Rehab Evaluation & Intervention   Assessment shows need for Vocational Rehabilitation No             Education: Education Goals: Education classes will be provided on a variety of topics geared toward better understanding of heart health and risk factor modification.  Participant will state understanding/return demonstration of topics presented as noted by education test scores.  Learning Barriers/Preferences:  Learning Barriers/Preferences - 06/07/21 1413       Learning Barriers/Preferences   Learning Barriers Sight   reading glasses, eye injections   Learning Preferences Skilled Demonstration             General Cardiac Education Topics:  AED/CPR: - Group verbal and written instruction with the use of models to demonstrate the basic use of the AED with the basic ABC's of resuscitation.   Anatomy and Cardiac Procedures: - Group verbal and  visual presentation and models provide information about basic cardiac anatomy and function. Reviews the testing methods done to diagnose heart disease and the outcomes of the test results. Describes the treatment choices: Medical Management, Angioplasty, or Coronary Bypass Surgery for treating various heart conditions including Myocardial Infarction, Angina, Valve Disease, and Cardiac Arrhythmias.  Written material given at graduation. Flowsheet Row Cardiac Rehab from 06/22/2021 in [facility] Cardiac and Pulmonary Rehab  Education need identified 06/08/21       Medication Safety: - Group verbal and visual instruction to review commonly prescribed medications for heart and lung disease. Reviews the medication, class of the drug, and side effects. Includes the steps to properly store meds and maintain the prescription regimen.  Written material given at graduation. Flowsheet Row Cardiac Rehab from 06/22/2021 in [facility] Cardiac and Pulmonary Rehab  Date 06/15/21  Educator [facility]  Instruction Review Code 1- Verbalizes Understanding       Intimacy: - Group verbal instruction through game format to discuss how heart and lung disease can affect sexual intimacy. Written material given at graduation..   Know Your Numbers and Heart Failure: - Group verbal and visual instruction to discuss disease risk factors for cardiac and pulmonary disease and treatment options.  Reviews associated critical values for Overweight/Obesity, Hypertension, Cholesterol, and Diabetes.  Discusses basics of heart failure: signs/symptoms and treatments.  Introduces Heart Failure Zone chart for action plan for heart failure.  Written material given at graduation. Flowsheet Row Cardiac Rehab from 06/22/2021 in [facility] Cardiac and Pulmonary Rehab  Date 06/22/21  Educator [employer]  Instruction Review Code 1- Verbalizes Understanding       Infection Prevention: - Provides verbal and written material to individual with discussion of infection  control including proper hand washing and proper equipment cleaning during exercise session. Flowsheet Row Cardiac Rehab from 06/22/2021 in [facility] Cardiac and Pulmonary Rehab  Date 06/08/21  Educator [facility]  Instruction Review Code 1- Verbalizes Understanding       Falls Prevention: - Provides verbal and written material to individual with discussion of falls prevention and safety. Flowsheet Row Cardiac Rehab from 06/22/2021 in [facility] Cardiac and Pulmonary Rehab  Date 06/08/21  Educator [facility]  Instruction Review Code 1- Verbalizes Understanding       Other: -Provides group and verbal instruction on various topics (see comments)   Knowledge Questionnaire Score:  Knowledge Questionnaire Score - 06/08/21 1540       Knowledge Questionnaire Score   Pre Score 22/24 Education Focus: exercise, nutrition, angina             Core Components/Risk Factors/Patient Goals at Admission:  Personal Goals and Risk Factors at Admission - 06/08/21 1540       Core Components/Risk Factors/Patient Goals on Admission    Weight Management Yes;Weight Loss    Intervention Weight Management: Develop a combined nutrition and exercise program designed to reach desired caloric intake, while maintaining appropriate intake  of nutrient and fiber, sodium and fats, and appropriate energy expenditure required for the weight goal.;Weight Management: Provide education and appropriate resources to help participant work on and attain dietary goals.    Admit Weight 111 lb 4.8 oz (50.5 kg)    Goal Weight: Short Term 105 lb (47.6 kg)    Goal Weight: Long Term 105 lb (47.6 kg)    Expected Outcomes Short Term: Continue to assess and modify interventions until short term weight is achieved;Long Term: Adherence to nutrition and physical activity/exercise program aimed toward attainment of established weight goal;Weight Loss: Understanding of general recommendations for a balanced deficit meal plan, which promotes 1-2 lb weight loss  per week and includes a negative energy balance of [phone] kcal/d;Understanding recommendations for meals to include 15-35% energy as protein, 25-35% energy from fat, 35-60% energy from carbohydrates, less than  of dietary cholesterol, 20-35 gm of total fiber daily;Understanding of distribution of calorie intake throughout the day with the consumption of 4-5 meals/snacks    Diabetes Yes    Intervention Provide education about signs/symptoms and action to take for hypo/hyperglycemia.;Provide education about proper nutrition, including hydration, and aerobic/resistive exercise prescription along with prescribed medications to achieve blood glucose in normal ranges: Fasting glucose 65-99 mg/dL    Expected Outcomes Short Term: Participant verbalizes understanding of the signs/symptoms and immediate care of hyper/hypoglycemia, proper foot care and importance of medication, aerobic/resistive exercise and nutrition plan for blood glucose control.;Long Term: Attainment of HbA1C < 7%.    Heart Failure Yes    Intervention Provide a combined exercise and nutrition program that is supplemented with education, support and counseling about heart failure. Directed toward relieving symptoms such as shortness of breath, decreased exercise tolerance, and extremity edema.    Expected Outcomes Improve functional capacity of life;Short term: Attendance in program 2-3 days a week with increased exercise capacity. Reported lower sodium intake. Reported increased fruit and vegetable intake. Reports medication compliance.;Short term: Daily weights obtained and reported for increase. Utilizing diuretic protocols set by physician.;Long term: Adoption of self-care skills and reduction of barriers for early signs and symptoms recognition and intervention leading to self-care maintenance.    Hypertension Yes    Intervention Provide education on lifestyle modifcations including regular physical activity/exercise, weight management,  moderate sodium restriction and increased consumption of fresh fruit, vegetables, and low fat dairy, alcohol moderation, and smoking cessation.;Monitor prescription use compliance.    Expected Outcomes Long Term: Maintenance of blood pressure at goal levels.;Short Term: Continued assessment and intervention until BP is < 140/50mm HG in hypertensive participants. < 130/32mm HG in hypertensive participants with diabetes, heart failure or chronic kidney disease.    Lipids Yes    Intervention Provide education and support for participant on nutrition & aerobic/resistive exercise along with prescribed medications to achieve LDL 70mg , HDL >40mg .    Expected Outcomes Short Term: Participant states understanding of desired cholesterol values and is compliant with medications prescribed. Participant is following exercise prescription and nutrition guidelines.;Long Term: Cholesterol controlled with medications as prescribed, with individualized exercise RX and with personalized nutrition plan. Value goals: LDL < , HDL > 40 mg.             Education:Diabetes - Individual verbal and written instruction to review signs/symptoms of diabetes, desired ranges of glucose level fasting, after meals and with exercise. Acknowledge that pre and post exercise glucose checks will be done for 3 sessions at entry of program. Flowsheet Row Cardiac Rehab from 06/22/2021 in [facility] Cardiac and Pulmonary Rehab  Date 06/08/21  Educator [facility]  Instruction Review Code 1- Verbalizes Understanding       Core Components/Risk Factors/Patient Goals Review:    Core Components/Risk Factors/Patient Goals at Discharge (Final Review):    ITP Comments:  ITP Comments     Row Name 06/07/21 1423 06/08/21 1533 06/15/21 1616 06/16/21 1543 07/06/21 0827   ITP Comments Completed virtual orientation today.  EP evaluation is scheduled for Wed 7/13 at 1330.  Documentation for diagnosis can be found in [facility] encounter 05/15/2021. Completed  and gym orientation. Initial ITP created and sent for review to Dr. Bethann Punches, Medical Director. Kylie Thomas did not complete her rehab session.  She was unable to exercise today due to elevated BG of 379. She has been in contact with her doctor and they are working on improving her medication regimen to address BG elevation. She was able to stay and attend the education class today. First full day of exercise!  Patient was oriented to gym and equipment including functions, settings, policies, and procedures.  Patient's individual exercise prescription and treatment plan were reviewed.  All starting workloads were established based on the results of the 6 minute walk test done at initial orientation visit.  The plan for exercise progression was also introduced and progression will be customized based on patient's performance and goals. 30 Day review completed. Medical Director ITP review done, changes made as directed, and signed approval by Medical Director.    New to program            Comments:

## 2021-07-06 NOTE — Progress Notes (Signed)
Daily Session Note  Patient Details  Name: Kylie Thomas MRN: [id number] Date of Birth: 09-14-1951 Referring Provider:   Flowsheet Row Cardiac Rehab from 06/08/2021 in [facility] Cardiac and Pulmonary Rehab  Referring Provider Serafina Royals MD       Encounter Date: 07/06/2021  Check In:  Session Check In - 07/06/21 1601       Check-In   Supervising physician immediately available to respond to emergencies See telemetry face sheet for immediately available ER MD    Location [facility]-Cardiac & Pulmonary Rehab    Staff Present Birdie Sons, MPA, Nino Glow, MS, ASCM CEP, Exercise Physiologist;Laureen Owens Shark, BS, RRT, CPFT    Virtual Visit No    Medication changes reported     No    Fall or balance concerns reported    No    Warm-up and Cool-down Performed on first and last piece of equipment    Resistance Training Performed Yes    VAD Patient? No    PAD/SET Patient? No      Pain Assessment   Currently in Pain? No/denies                Social History   Tobacco Use  Smoking Status Never  Smokeless Tobacco Never    Goals Met:  Independence with exercise equipment Exercise tolerated well No report of cardiac concerns or symptoms Strength training completed today  Goals Unmet:  Not Applicable  Comments: Pt able to follow exercise prescription today without complaint.  Will continue to monitor for progression.    Dr. Emily Filbert is Medical Director for [place].  Dr. Ottie Glazier is Medical Director for [facility] Pulmonary Rehabilitation.

## 2021-07-07 ENCOUNTER — Other Ambulatory Visit: Payer: Self-pay

## 2021-07-07 DIAGNOSIS — Z955 Presence of coronary angioplasty implant and graft: Secondary | ICD-10-CM

## 2021-07-07 DIAGNOSIS — I2121 ST elevation (STEMI) myocardial infarction involving left circumflex coronary artery: Secondary | ICD-10-CM

## 2021-07-07 DIAGNOSIS — I252 Old myocardial infarction: Secondary | ICD-10-CM | POA: Diagnosis not present

## 2021-07-07 LAB — GLUCOSE, CAPILLARY: Glucose-Capillary: 456 mg/dL — ABNORMAL HIGH (ref 70–99)

## 2021-07-07 NOTE — Progress Notes (Signed)
Incomplete Session Note  Patient Details  Name: Kylie Thomas MRN: [id number] Date of Birth: 1951-01-01 Referring Provider:   Flowsheet Row Cardiac Rehab from 06/08/2021 in [facility] Cardiac and Pulmonary Rehab  Referring Provider [name] MD       [name] did not complete her rehab session.  Her blood sugar level was checked, resulted as 456. Patient was asymptomatic and stated she had not taken her insulin. She was encouraged to take her insulin and continue to monitor her blood sugar level and contact her doctor should the blood sugar level not decrease with treatment. Patient stated understanding and had no further questions at this time.

## 2021-07-11 ENCOUNTER — Other Ambulatory Visit: Payer: Self-pay

## 2021-07-11 DIAGNOSIS — Z955 Presence of coronary angioplasty implant and graft: Secondary | ICD-10-CM

## 2021-07-11 DIAGNOSIS — I2121 ST elevation (STEMI) myocardial infarction involving left circumflex coronary artery: Secondary | ICD-10-CM

## 2021-07-11 DIAGNOSIS — I252 Old myocardial infarction: Secondary | ICD-10-CM | POA: Diagnosis not present

## 2021-07-11 LAB — GLUCOSE, CAPILLARY
Glucose-Capillary: 84 mg/dL (ref 70–99)
Glucose-Capillary: 88 mg/dL (ref 70–99)

## 2021-07-11 NOTE — Progress Notes (Signed)
Incomplete Session Note  Patient Details  Name: Kylie Thomas MRN: [id number] Date of Birth: 03-19-1951 Referring Provider:   Flowsheet Row Cardiac Rehab from 06/08/2021 in [facility] Cardiac and Pulmonary Rehab  Referring Provider [name] MD       [name] did not complete her rehab session.  Patient's blood glucose level was too low to exercise per policy: initial BG 84; BG 88 after the patient drank apple juice (policy: blood glucose must be greater than 80 to exercise).   Patient was educated about medication compliance and eating/sleep habits. Patient advised to keep a log of meal times in relation to blood glucose level and medication taken to provide to endocrinologist. Patient advised to discuss diabetic concerns, meal and sleep habits, and medication administration with her endocrinologist at her next appointment. Patient states she has an appointment with her endocrinologist this week. Patient was asymptomatic when she left and encouraged to eat and monitor her blood glucose levels.

## 2021-07-13 ENCOUNTER — Other Ambulatory Visit: Payer: Self-pay

## 2021-07-13 DIAGNOSIS — I252 Old myocardial infarction: Secondary | ICD-10-CM | POA: Diagnosis not present

## 2021-07-13 DIAGNOSIS — I2121 ST elevation (STEMI) myocardial infarction involving left circumflex coronary artery: Secondary | ICD-10-CM

## 2021-07-13 DIAGNOSIS — Z955 Presence of coronary angioplasty implant and graft: Secondary | ICD-10-CM

## 2021-07-13 LAB — GLUCOSE, CAPILLARY: Glucose-Capillary: 164 mg/dL — ABNORMAL HIGH (ref 70–99)

## 2021-07-13 NOTE — Progress Notes (Signed)
Daily Session Note  Patient Details  Name: Kylie Thomas MRN: [id number] Date of Birth: [birth year] Referring Provider:   Flowsheet Row Cardiac Rehab from 06/08/2021 in [facility] Cardiac and Pulmonary Rehab  Referring Provider Serafina Royals MD       Encounter Date: 07/13/2021  Check In:  Session Check In - 07/13/21 1534       Check-In   Supervising physician immediately available to respond to emergencies See telemetry face sheet for immediately available ER MD    Location [facility]-Cardiac & Pulmonary Rehab    Staff Present Birdie Sons, MPA, Nino Glow, MS, ASCM CEP, Exercise Physiologist;Joseph Tessie Fass, [place]    Virtual Visit No    Medication changes reported     No    Fall or balance concerns reported    No    Warm-up and Cool-down Performed on first and last piece of equipment    Resistance Training Performed Yes    VAD Patient? No    PAD/SET Patient? No      Pain Assessment   Currently in Pain? No/denies                Social History   Tobacco Use  Smoking Status Never  Smokeless Tobacco Never    Goals Met:  Independence with exercise equipment Exercise tolerated well No report of cardiac concerns or symptoms Strength training completed today  Goals Unmet:  Not Applicable  Comments: Pt able to follow exercise prescription today without complaint.  Blood glucose was 164. Will continue to monitor for progression.    Dr. Emily Filbert is Medical Director for [place].  Dr. Ottie Glazier is Medical Director for [facility] Pulmonary Rehabilitation.

## 2021-07-14 ENCOUNTER — Other Ambulatory Visit: Payer: Self-pay

## 2021-07-14 ENCOUNTER — Encounter: Payer: Medicare PPO | Admitting: *Deleted

## 2021-07-14 DIAGNOSIS — Z955 Presence of coronary angioplasty implant and graft: Secondary | ICD-10-CM

## 2021-07-14 DIAGNOSIS — I2121 ST elevation (STEMI) myocardial infarction involving left circumflex coronary artery: Secondary | ICD-10-CM

## 2021-07-14 DIAGNOSIS — I252 Old myocardial infarction: Secondary | ICD-10-CM | POA: Diagnosis not present

## 2021-07-14 NOTE — Progress Notes (Signed)
Daily Session Note  Patient Details  Name: Kylie Thomas MRN: [id number] Date of Birth: [birth year] Referring Provider:   Flowsheet Row Cardiac Rehab from 06/08/2021 in [facility] Cardiac and Pulmonary Rehab  Referring Provider Serafina Royals MD       Encounter Date: 07/14/2021  Check In:  Session Check In - 07/14/21 1536       Check-In   Supervising physician immediately available to respond to emergencies See telemetry face sheet for immediately available ER MD    Location [facility]-Cardiac & Pulmonary Rehab    Staff Present Renita Papa, RN BSN;Joseph Tessie Fass, RCP,RRT,BSRT;Melissa [place], [place], LDN    Virtual Visit No    Medication changes reported     No    Fall or balance concerns reported    No    Warm-up and Cool-down Performed on first and last piece of equipment    Resistance Training Performed Yes    VAD Patient? No    PAD/SET Patient? No      Pain Assessment   Currently in Pain? No/denies                Social History   Tobacco Use  Smoking Status Never  Smokeless Tobacco Never    Goals Met:  Independence with exercise equipment Exercise tolerated well No report of cardiac concerns or symptoms Strength training completed today  Goals Unmet:  Not Applicable  Comments: Pt able to follow exercise prescription today without complaint.  Will continue to monitor for progression.    Dr. Emily Filbert is Medical Director for [place].  Dr. Ottie Glazier is Medical Director for [facility] Pulmonary Rehabilitation.

## 2021-07-18 ENCOUNTER — Other Ambulatory Visit: Payer: Self-pay

## 2021-07-18 DIAGNOSIS — Z955 Presence of coronary angioplasty implant and graft: Secondary | ICD-10-CM

## 2021-07-18 DIAGNOSIS — I2121 ST elevation (STEMI) myocardial infarction involving left circumflex coronary artery: Secondary | ICD-10-CM

## 2021-07-18 DIAGNOSIS — I252 Old myocardial infarction: Secondary | ICD-10-CM | POA: Diagnosis not present

## 2021-07-18 NOTE — Progress Notes (Signed)
Daily Session Note  Patient Details  Name: Kylie Thomas MRN: [id number] Date of Birth: May 06, 1951 Referring Provider:   Flowsheet Row Cardiac Rehab from 06/08/2021 in [facility] Cardiac and Pulmonary Rehab  Referring Provider Serafina Royals MD       Encounter Date: 07/18/2021  Check In:  Session Check In - 07/18/21 1539       Check-In   Supervising physician immediately available to respond to emergencies See telemetry face sheet for immediately available ER MD    Location [facility]-Cardiac & Pulmonary Rehab    Staff Present Birdie Sons, MPA, Nino Glow, MS, ASCM CEP, Exercise Physiologist;Amanda Oletta Darter, BA, ACSM CEP, Exercise Physiologist    Virtual Visit No    Medication changes reported     No    Fall or balance concerns reported    No    Warm-up and Cool-down Performed on first and last piece of equipment    Resistance Training Performed Yes    VAD Patient? No    PAD/SET Patient? No      Pain Assessment   Currently in Pain? No/denies                Social History   Tobacco Use  Smoking Status Never  Smokeless Tobacco Never    Goals Met:  Independence with exercise equipment Exercise tolerated well No report of cardiac concerns or symptoms Strength training completed today  Goals Unmet:  Not Applicable  Comments: Pt able to follow exercise prescription today without complaint.  Will continue to monitor for progression.    Dr. Emily Filbert is Medical Director for [place].  Dr. Ottie Glazier is Medical Director for [facility] Pulmonary Rehabilitation.

## 2021-07-20 ENCOUNTER — Other Ambulatory Visit: Payer: Self-pay

## 2021-07-20 DIAGNOSIS — I2121 ST elevation (STEMI) myocardial infarction involving left circumflex coronary artery: Secondary | ICD-10-CM

## 2021-07-20 DIAGNOSIS — Z955 Presence of coronary angioplasty implant and graft: Secondary | ICD-10-CM

## 2021-07-20 DIAGNOSIS — I252 Old myocardial infarction: Secondary | ICD-10-CM | POA: Diagnosis not present

## 2021-07-20 LAB — GLUCOSE, CAPILLARY
Glucose-Capillary: 59 mg/dL — ABNORMAL LOW (ref 70–99)
Glucose-Capillary: 72 mg/dL (ref 70–99)

## 2021-07-20 NOTE — Progress Notes (Signed)
Daily Session Note  Patient Details  Name: Kylie Thomas MRN: [id number] Date of Birth: 1951/10/30 Referring Provider:   Flowsheet Row Cardiac Rehab from 06/08/2021 in [facility] Cardiac and Pulmonary Rehab  Referring Provider Serafina Royals MD       Encounter Date: 07/20/2021  Check In:  Session Check In - 07/20/21 1538       Check-In   Supervising physician immediately available to respond to emergencies See telemetry face sheet for immediately available ER MD    Location [facility]-Cardiac & Pulmonary Rehab    Staff Present Birdie Sons, MPA, Nino Glow, MS, ASCM CEP, Exercise Physiologist;Joseph Tessie Fass, [place]    Virtual Visit No    Medication changes reported     Yes    Comments decreased Novolog insulin from 10 units to 8 units    Fall or balance concerns reported    No    Warm-up and Cool-down Performed on first and last piece of equipment    Resistance Training Performed Yes    VAD Patient? No    PAD/SET Patient? No      Pain Assessment   Currently in Pain? No/denies                Social History   Tobacco Use  Smoking Status Never  Smokeless Tobacco Never    Goals Met:  Independence with exercise equipment Exercise tolerated well No report of cardiac concerns or symptoms Strength training completed today  Goals Unmet:  Not Applicable  Comments: Pt able to follow exercise prescription today without complaint.  Will continue to monitor for progression.    Dr. Emily Filbert is Medical Director for [place].  Dr. Ottie Glazier is Medical Director for [facility] Pulmonary Rehabilitation.

## 2021-07-26 ENCOUNTER — Encounter: Payer: Self-pay | Admitting: *Deleted

## 2021-07-26 DIAGNOSIS — Z955 Presence of coronary angioplasty implant and graft: Secondary | ICD-10-CM

## 2021-07-26 DIAGNOSIS — I2121 ST elevation (STEMI) myocardial infarction involving left circumflex coronary artery: Secondary | ICD-10-CM

## 2021-07-26 NOTE — Progress Notes (Signed)
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 [facility])  Status post coronary artery stent placement  Patient's Home Medications on Admission:  Current Outpatient Medications:    amiodarone (PACERONE) 200 MG tablet, Take 200 mg by mouth daily., Disp: , Rfl:    amiodarone (PACERONE) 200 MG tablet, Take 1 tablet by mouth daily., Disp: , Rfl:    apixaban (ELIQUIS) 5 MG TABS tablet, Take by mouth., Disp: , Rfl:    atorvastatin (LIPITOR) 10 MG tablet, Take 1 tablet by mouth daily., Disp: , Rfl:    clopidogrel (PLAVIX) 75 MG tablet, Take 75 mg by mouth daily., Disp: , Rfl:    clopidogrel (PLAVIX) 75 MG tablet, Take 4 tablets (300 mg) by mouth once this evening (04/28/21) followed by 75 mg daily starting on Friday 04/29/21., Disp: , Rfl:    dicyclomine (BENTYL) 10 MG capsule, Take 10 mg by mouth 3 (three) times daily., Disp: , Rfl:    dorzolamide-timolol (COSOPT) 22.3-6.8 MG/ML ophthalmic solution, Place 1 drop into both eyes 2 (two) times daily., Disp: , Rfl: 0   DULoxetine (CYMBALTA) 60 MG capsule, Take 60 mg by mouth daily., Disp: , Rfl: 5   DULoxetine (CYMBALTA) 60 MG capsule, Take 1 capsule by mouth daily., Disp: , Rfl:    ELIQUIS 5 MG TABS tablet, Take 5 mg by mouth 2 (two) times daily., Disp: , Rfl:    furosemide (LASIX) 20 MG tablet, Take 3 tablets (60 mg total) by mouth 2 (two) times daily., Disp: 180 tablet, Rfl: 0   furosemide (LASIX) 20 MG tablet, Take by mouth., Disp: , Rfl:    gabapentin (NEURONTIN) 300 MG capsule, Take 300 mg by mouth daily as needed.,  Disp: , Rfl:    gabapentin (NEURONTIN) 300 MG capsule, Take 1 capsule by mouth daily as needed., Disp: , Rfl:    glucose blood test strip, OneTouch Ultra Blue Test Strip, Disp: , Rfl:    hyoscyamine (LEVBID) 0.375 MG 12 hr tablet, Take by mouth., Disp: , Rfl:    insulin aspart (NOVOLOG) 100 UNIT/ML injection, Inject 4 Units into the skin with breakfast, with lunch, and with evening meal. And sliding scale, Disp: , Rfl:    insulin glargine (LANTUS) 100 UNIT/ML injection, Inject 10 Units into the skin daily., Disp: , Rfl:    isosorbide mononitrate (IMDUR) 30 MG 24 hr tablet, Take 2 tablets (60 mg total) by mouth daily. (Patient not taking: Reported on 06/07/2021), Disp: 30 tablet, Rfl: 0   loratadine (CLARITIN) 10 MG tablet, Take 10 mg by mouth daily as needed for allergies., Disp: , Rfl:    losartan (COZAAR) 25 MG tablet, Take 12.5 mg by mouth every morning. (Patient not taking: No sig reported), Disp: , Rfl:    magnesium oxide (MAG-OX) 400 MG tablet, Take 400 mg by mouth daily., Disp: , Rfl:    metoprolol succinate (TOPROL-XL) 50 MG 24 hr tablet, Take 50 mg by mouth 2 (two) times daily., Disp: , Rfl:    metoprolol succinate (TOPROL-XL) 50 MG 24 hr tablet, Take by mouth., Disp: , Rfl:  mirtazapine (REMERON) 7.5 MG tablet, Take 7.5 mg by mouth at bedtime., Disp: , Rfl:    mirtazapine (REMERON) 7.5 MG tablet, Take 1 tablet by mouth 2 (two) times daily., Disp: , Rfl:    nitroGLYCERIN (NITROSTAT) 0.4 MG SL tablet, Place 1 tablet (0.4 mg total) under the tongue every 5 (five) minutes as needed., Disp: 20 tablet, Rfl: 3   nitroGLYCERIN (NITROSTAT) 0.4 MG SL tablet, Place under the tongue., Disp: , Rfl:    pantoprazole (PROTONIX) 20 MG tablet, Take 20 mg by mouth daily., Disp: , Rfl:    pantoprazole (PROTONIX) 20 MG tablet, Take by mouth., Disp: , Rfl:    rosuvastatin (CRESTOR) 40 MG tablet, Take 40 mg by mouth every morning., Disp: , Rfl:   Past Medical History: Past Medical History:  Diagnosis Date    Anxiety    Arrhythmia    Atrial fibrillation (HCC)    CHF (congestive heart failure) (HCC)    Coronary artery disease    Depression    Diabetes mellitus without complication (HCC)    GERD (gastroesophageal reflux disease)    Hyperlipidemia    Hypertension     Tobacco Use: Social History   Tobacco Use  Smoking Status Never  Smokeless Tobacco Never    Labs: Recent Review Flowsheet Data     Labs for ITP Cardiac and Pulmonary Rehab Latest Ref Rng & Units 02/16/2019 05/16/2021   Cholestrol 0 - 200 mg/dL - 95   LDLCALC 0 - 99 mg/dL - 45   HDL >16 mg/dL - 10(R)   Trlycerides <604 mg/dL - 52   HCO3 54.0 - 98.1 mmol/L 23.7 -        Exercise Target Goals: Exercise Program Goal: Individual exercise prescription set using results from initial 6 min walk test and THRR while considering  patient's activity barriers and safety.   Exercise Prescription Goal: Initial exercise prescription builds to 30-45 minutes a day of aerobic activity, 2-3 days per week.  Home exercise guidelines will be given to patient during program as part of exercise prescription that the participant will acknowledge.   Education: Aerobic Exercise: - Group verbal and visual presentation on the components of exercise prescription. Introduces F.I.T.T principle from ACSM for exercise prescriptions.  Reviews F.I.T.T. principles of aerobic exercise including progression. Written material given at graduation. Flowsheet Row Cardiac Rehab from 07/13/2021 in [facility] Cardiac and Pulmonary Rehab  Education need identified 06/08/21       Education: Resistance Exercise: - Group verbal and visual presentation on the components of exercise prescription. Introduces F.I.T.T principle from ACSM for exercise prescriptions  Reviews F.I.T.T. principles of resistance exercise including progression. Written material given at graduation.    Education: Exercise & Equipment Safety: - Individual verbal instruction and demonstration  of equipment use and safety with use of the equipment. Flowsheet Row Cardiac Rehab from 07/13/2021 in [facility] Cardiac and Pulmonary Rehab  Date 06/08/21  Educator [facility]  Instruction Review Code 1- Verbalizes Understanding       Education: Exercise Physiology & General Exercise Guidelines: - Group verbal and written instruction with models to review the exercise physiology of the cardiovascular system and associated critical values. Provides general exercise guidelines with specific guidelines to those with heart or lung disease.  Flowsheet Row Cardiac Rehab from 07/13/2021 in [facility] Cardiac and Pulmonary Rehab  Date 07/13/21  Educator AS  Instruction Review Code 1- Verbalizes Understanding       Education: Flexibility, Balance, Mind/Body Relaxation: - Group verbal and visual presentation with interactive  activity on the components of exercise prescription. Introduces F.I.T.T principle from ACSM for exercise prescriptions. Reviews F.I.T.T. principles of flexibility and balance exercise training including progression. Also discusses the mind body connection.  Reviews various relaxation techniques to help reduce and manage stress (i.e. Deep breathing, progressive muscle relaxation, and visualization). Balance handout provided to take home. Written material given at graduation.   Activity Barriers & Risk Stratification:  Activity Barriers & Cardiac Risk Stratification - 06/08/21 1534       Activity Barriers & Cardiac Risk Stratification   Activity Barriers History of Falls;Balance Concerns;Deconditioning;Muscular Weakness;Shortness of Breath    Cardiac Risk Stratification High             6 Minute Walk:  6 Minute Walk     Row Name 06/08/21 1534         6 Minute Walk   Phase Initial     Distance 1030 feet     Walk Time 6 minutes     # of Rest Breaks 0     MPH 1.95     METS 2.6     RPE 10     Perceived Dyspnea  1     VO2 Peak 9.1     Symptoms Yes (comment)     Comments SOB      Resting HR 63 bpm     Resting BP 122/70     Resting Oxygen Saturation  99 %     Exercise Oxygen Saturation  during 6 min walk 99 %     Max Ex. HR 97 bpm     Max Ex. BP 128/74     2 Minute Post BP 126/64              Oxygen Initial Assessment:   Oxygen Re-Evaluation:   Oxygen Discharge (Final Oxygen Re-Evaluation):   Initial Exercise Prescription:  Initial Exercise Prescription - 06/08/21 1500       Date of Initial Exercise RX and Referring Provider   Date 06/08/21    Referring Provider [name] MD      Treadmill   MPH 2    Grade 1    Minutes 15    METs 2.81      Arm Ergometer   Level 1    Watts 25    RPM 30    Minutes 15    METs 2      REL-XR   Level 2    Speed 50    Minutes 15    METs 2      Track   Laps 29    Minutes 15    METs 2.58      Prescription Details   Frequency (times per week) 3    Duration Progress to 30 minutes of continuous aerobic without signs/symptoms of physical distress      Intensity   THRR 40-80% of Max Heartrate 98-137    Ratings of Perceived Exertion 11-13    Perceived Dyspnea 0-4      Progression   Progression Continue to progress workloads to maintain intensity without signs/symptoms of physical distress.      Resistance Training   Training Prescription Yes    Weight 3 lb    Reps 10-15             Perform Capillary Blood Glucose checks as needed.  Exercise Prescription Changes:   Exercise Prescription Changes     Row Name 06/08/21 1500 06/28/21 0900 07/13/21 0800 07/25/21 0900  Response to Exercise   Blood Pressure (Admit) 122/70 104/58 108/60 102/64    Blood Pressure (Exercise) 128/74 126/60 100/60 --    Blood Pressure (Exit) 126/64 104/56 100/58 110/58    Heart Rate (Admit) 63 bpm 70 bpm 69 bpm 81 bpm    Heart Rate (Exercise) 97 bpm 89 bpm 104 bpm 127 bpm    Heart Rate (Exit) 68 bpm 70 bpm 67 bpm 81 bpm    Oxygen Saturation (Admit) 99 % -- -- --    Oxygen Saturation (Exercise) 99 %  -- -- --    Rating of Perceived Exertion (Exercise) Perceived Dyspnea (Exercise) 1 -- -- --    Symptoms SOB none none none    Comments walk test results -- -- --    Duration -- Continue with 30 min of aerobic exercise without signs/symptoms of physical distress. Continue with 30 min of aerobic exercise without signs/symptoms of physical distress. Continue with 30 min of aerobic exercise without signs/symptoms of physical distress.    Intensity -- THRR unchanged THRR unchanged THRR unchanged         Progression   Progression -- Continue to progress workloads to maintain intensity without signs/symptoms of physical distress. Continue to progress workloads to maintain intensity without signs/symptoms of physical distress. Continue to progress workloads to maintain intensity without signs/symptoms of physical distress.    Average METs -- 2.28 1.9 2.52         Resistance Training   Training Prescription -- Yes Yes Yes    Weight -- 3 lb 3 lb 3 lb    Reps -- 10-15 10-15 10-15         Interval Training   Interval Training -- No No No         Treadmill   MPH -- 2 1.2 --    Grade -- 1 0 --    Minutes -- 15 15 --    METs -- 2.81 1.92 --         Arm Ergometer   Level -- 1 -- --    Minutes -- 15 -- --         REL-XR   Level -- 2 1 --    Minutes -- 15 15 --    METs -- 3.4 -- --         Biostep-RELP   Level -- -- -- 1    Minutes -- -- -- 15    METs -- -- -- 2         Track   Laps -- 13 -- 35    Minutes -- 15 -- 15    METs -- 1.71 -- 2.9             Exercise Comments:   Exercise Comments     Row Name 06/15/21 1616 07/07/21 1549 07/13/21 1536 07/20/21 1616     Exercise Comments Kylie Thomas did not complete her rehab session.  She was unable to exercise today due to elevated BG of 379. She has been in contact with her doctor and they are working on improving her medication regimen to address BG elevation. She was able to stay and attend the education  class today. Kylie Thomas did not complete her rehab session.  Her blood sugar level was checked, resulted as 456. Patient was asymptomatic and stated she had not taken her insulin. She was encouraged to take her insulin and continue to monitor her blood sugar  level and contact her doctor should the blood sugar level not decrease with treatment. Patient stated understanding and had no further questions at this time. Pt able to follow exercise prescription today without complaint.  Blood glucose was 164. Will continue to monitor for progression. Pt stated that she will be having heart surgery next week, on Tuesday, August 30th.             Exercise Goals and Review:   Exercise Goals     Row Name 06/08/21 1536             Exercise Goals   Increase Physical Activity Yes       Intervention Provide advice, education, support and counseling about physical activity/exercise needs.;Develop an individualized exercise prescription for aerobic and resistive training based on initial evaluation findings, risk stratification, comorbidities and participant's personal goals.       Expected Outcomes Short Term: Attend rehab on a regular basis to increase amount of physical activity.;Long Term: Add in home exercise to make exercise part of routine and to increase amount of physical activity.;Long Term: Exercising regularly at least 3-5 days a week.       Increase Strength and Stamina Yes       Intervention Provide advice, education, support and counseling about physical activity/exercise needs.;Develop an individualized exercise prescription for aerobic and resistive training based on initial evaluation findings, risk stratification, comorbidities and participant's personal goals.       Expected Outcomes Short Term: Increase workloads from initial exercise prescription for resistance, speed, and METs.;Short Term: Perform resistance training exercises routinely during rehab and add in resistance training at  home;Long Term: Improve cardiorespiratory fitness, muscular endurance and strength as measured by increased METs and functional capacity ( )       Able to understand and use rate of perceived exertion (RPE) scale Yes       Intervention Provide education and explanation on how to use RPE scale       Expected Outcomes Short Term: Able to use RPE daily in rehab to express subjective intensity level;Long Term:  Able to use RPE to guide intensity level when exercising independently       Able to understand and use Dyspnea scale Yes       Intervention Provide education and explanation on how to use Dyspnea scale       Expected Outcomes Short Term: Able to use Dyspnea scale daily in rehab to express subjective sense of shortness of breath during exertion;Long Term: Able to use Dyspnea scale to guide intensity level when exercising independently       Knowledge and understanding of Target Heart Rate Range (THRR) Yes       Intervention Provide education and explanation of THRR including how the numbers were predicted and where they are located for reference       Expected Outcomes Short Term: Able to state/look up THRR;Long Term: Able to use THRR to govern intensity when exercising independently;Short Term: Able to use daily as guideline for intensity in rehab       Able to check pulse independently Yes       Intervention Provide education and demonstration on how to check pulse in carotid and radial arteries.;Review the importance of being able to check your own pulse for safety during independent exercise       Expected Outcomes Long Term: Able to check pulse independently and accurately;Short Term: Able to explain why pulse checking is important during independent exercise  Understanding of Exercise Prescription Yes       Intervention Provide education, explanation, and written materials on patient's individual exercise prescription       Expected Outcomes Short Term: Able to explain program  exercise prescription;Long Term: Able to explain home exercise prescription to exercise independently                Exercise Goals Re-Evaluation :  Exercise Goals Re-Evaluation     Row Name 06/16/21 1547 06/28/21 0857 07/13/21 0804 07/25/21 0929       Exercise Goal Re-Evaluation   Exercise Goals Review Increase Physical Activity;Able to understand and use rate of perceived exertion (RPE) scale;Knowledge and understanding of Target Heart Rate Range (THRR);Understanding of Exercise Prescription;Increase Strength and Stamina;Able to check pulse independently Increase Physical Activity;Increase Strength and Stamina;Understanding of Exercise Prescription Increase Physical Activity;Increase Strength and Stamina Increase Physical Activity;Increase Strength and Stamina    Comments Reviewed RPE and dyspnea scales, THR and program prescription with pt today.  Pt voiced understanding and was given a copy of goals to take home. Kylie Thomas is off to a good start in rehab.  Her attendance is dependent on her blood sugars and we have encouraged her to talk with her doctor for improved control.  She only got 13 laps on track and we will encourage to walk faster on track as she is at 2.0 mph on treadmill which would be equal to 28 laps.  We will continue to monitor her progress. Kylie Thomas will be out for unknown time due to having a CABG scheduled for next week. Kylie Thomas has not had full sessions of exercise continuously due to her blood sugars which has been verbalized she needs to talk to her doctor and follow up closely. Kylie Thomas will be getting discharged as she is supposed to have a CABG.    Expected Outcomes Short: Use RPE daily to regulate intensity. Long: Follow program prescription in THR. Short: Continue to attend regularly and walk faster Long: Continue to follow program prescription -- No goals at this time due to discharge             Discharge Exercise Prescription (Final Exercise Prescription Changes):   Exercise Prescription Changes - 07/25/21 0900       Response to Exercise   Blood Pressure (Admit) 102/64    Blood Pressure (Exit) 110/58    Heart Rate (Admit) 81 bpm    Heart Rate (Exercise) 127 bpm    Heart Rate (Exit) 81 bpm    Rating of Perceived Exertion (Exercise) 15    Symptoms none    Duration Continue with 30 min of aerobic exercise without signs/symptoms of physical distress.    Intensity THRR unchanged      Progression   Progression Continue to progress workloads to maintain intensity without signs/symptoms of physical distress.    Average METs 2.52      Resistance Training   Training Prescription Yes    Weight 3 lb    Reps 10-15      Interval Training   Interval Training No      Biostep-RELP   Level 1    Minutes 15    METs 2      Track   Laps 35    Minutes 15    METs 2.9             Nutrition:  Target Goals: Understanding of nutrition guidelines, daily intake of sodium 1500mg , cholesterol 200mg , calories 30% from fat and 7% or less from  saturated fats, daily to have 5 or more servings of fruits and vegetables.  Education: All About Nutrition: -Group instruction provided by verbal, written material, interactive activities, discussions, models, and posters to present general guidelines for heart healthy nutrition including fat, fiber, MyPlate, the role of sodium in heart healthy nutrition, utilization of the nutrition label, and utilization of this knowledge for meal planning. Follow up email sent as well. Written material given at graduation. Flowsheet Row Cardiac Rehab from 07/13/2021 in [facility] Cardiac and Pulmonary Rehab  Education need identified 06/08/21       Biometrics:  Pre Biometrics - 06/08/21 1536       Pre Biometrics   Height 5' 1.5" (1.562 m)    Weight 111 lb 4.8 oz (50.5 kg)    BMI (Calculated) 20.69    Single Leg Stand 12 seconds              Nutrition Therapy Plan and Nutrition Goals:  Nutrition Therapy & Goals - 06/07/21  1414       Intervention Plan   Intervention Prescribe, educate and counsel regarding individualized specific dietary modifications aiming towards targeted core components such as weight, hypertension, lipid management, diabetes, heart failure and other comorbidities.    Expected Outcomes Short Term Goal: Understand basic principles of dietary content, such as calories, fat, sodium, cholesterol and nutrients.;Short Term Goal: A plan has been developed with personal nutrition goals set during dietitian appointment.;Long Term Goal: Adherence to prescribed nutrition plan.             Nutrition Assessments:  MEDIFICTS Score Key: ?70 Need to make dietary changes  40-70 Heart Healthy Diet ? 40 Therapeutic Level Cholesterol Diet  Flowsheet Row Cardiac Rehab from 06/08/2021 in [facility] Cardiac and Pulmonary Rehab  Picture Your Plate Total Score on Admission 74      Picture Your Plate Scores: <16 Unhealthy dietary pattern with much room for improvement. 41-50 Dietary pattern unlikely to meet recommendations for good health and room for improvement. 51-60 More healthful dietary pattern, with some room for improvement.  >60 Healthy dietary pattern, although there may be some specific behaviors that could be improved.    Nutrition Goals Re-Evaluation:   Nutrition Goals Discharge (Final Nutrition Goals Re-Evaluation):   Psychosocial: Target Goals: Acknowledge presence or absence of significant depression and/or stress, maximize coping skills, provide positive support system. Participant is able to verbalize types and ability to use techniques and skills needed for reducing stress and depression.   Education: Stress, Anxiety, and Depression - Group verbal and visual presentation to define topics covered.  Reviews how body is impacted by stress, anxiety, and depression.  Also discusses healthy ways to reduce stress and to treat/manage anxiety and depression.  Written material given at  graduation. Flowsheet Row Cardiac Rehab from 07/13/2021 in [facility] Cardiac and Pulmonary Rehab  Date 07/06/21  Educator AS  Instruction Review Code 1- Verbalizes Understanding       Education: Sleep Hygiene -Provides group verbal and written instruction about how sleep can affect your health.  Define sleep hygiene, discuss sleep cycles and impact of sleep habits. Review good sleep hygiene tips.    Initial Review & Psychosocial Screening:  Initial Psych Review & Screening - 06/07/21 1416       Initial Review   Current issues with Current Anxiety/Panic;Current Depression;History of Depression;Current Psychotropic Meds;Current Stress Concerns    Source of Stress Concerns Family;Financial    Comments ongoing divorce issues, trying to sell house, trying to separate finances (his mother  is hiding money and funds)      Family Dynamics   Good Support System? Yes   son and daughter, neighbors nearby   Comments Still suffering from her divorce 10 yrs.  She has been coughing for 2 years and still unknown cause, Currently on anxiety meds      Barriers   Psychosocial barriers to participate in program There are no identifiable barriers or psychosocial needs.;The patient should benefit from training in stress management and relaxation.      Screening Interventions   Interventions Encouraged to exercise;To provide support and resources with identified psychosocial needs;Provide feedback about the scores to participant    Expected Outcomes Short Term goal: Utilizing psychosocial counselor, staff and physician to assist with identification of specific Stressors or current issues interfering with healing process. Setting desired goal for each stressor or current issue identified.;Long Term Goal: Stressors or current issues are controlled or eliminated.;Short Term goal: Identification and review with participant of any Quality of Life or Depression concerns found by scoring the questionnaire.;Long Term goal:  The participant improves quality of Life and PHQ9 Scores as seen by post scores and/or verbalization of changes             Quality of Life Scores:   Quality of Life - 06/08/21 1539       Quality of Life   Select Quality of Life      Quality of Life Scores   Health/Function Pre 16.64 %    Socioeconomic Pre 19.79 %    Psych/Spiritual Pre 21.79 %    Family Pre 25.5 %    GLOBAL Pre 19.56 %            Scores of 19 and below usually indicate a poorer quality of life in these areas.  A difference of  2-3 points is a clinically meaningful difference.  A difference of 2-3 points in the total score of the Quality of Life Index has been associated with significant improvement in overall quality of life, self-image, physical symptoms, and general health in studies assessing change in quality of life.  PHQ-9: Recent Review Flowsheet Data     Depression screen [facility] 2/9 06/08/2021 06/01/2021   Decreased Interest 1 0   Down, Depressed, Hopeless 1 0   PHQ - 2 Score 2 0   Altered sleeping 2 -   Tired, decreased energy 2 -   Change in appetite 2 -   Feeling bad or failure about yourself  2 -   Trouble concentrating 0 -   Moving slowly or fidgety/restless 1 -   Suicidal thoughts 0 -   PHQ-9 Score 11 -   Difficult doing work/chores Somewhat difficult -      Interpretation of Total Score  Total Score Depression Severity:  1-4 = Minimal depression, 5-9 = Mild depression, 10-14 = Moderate depression, 15-19 = Moderately severe depression, 20-27 = Severe depression   Psychosocial Evaluation and Intervention:  Psychosocial Evaluation - 06/07/21 1424       Psychosocial Evaluation & Interventions   Interventions Stress management education;Encouraged to exercise with the program and follow exercise prescription    Comments Kylie Thomas is 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  She is eager to get started to be able to keep with her kids.  She wants to get better to be able to walk around for  shopping.  She has a history of depression and anxiety.  A lot is steming from her on going divorce for 10  years. They keep discovering more funds to argue over.  She is due in court in August to finalize things.  She is a little hesistant to trust doctors as she has been getting the run around with her cough for two years. She also has been having some dizzy spells Her sugars are out of control ranging any where between 400-44 mg/dl.  We talked about trying to get better control while here.  Her son is an exercise science major and eager to her get better as calls to check on her rouinely.  She also has great support from her neighbors who stop by to check in too.    Expected Outcomes Short: Attend rehab to regain strength Long; continue to focus on the good that surrounds her.             Psychosocial Re-Evaluation:   Psychosocial Discharge (Final Psychosocial Re-Evaluation):   Vocational Rehabilitation: Provide vocational rehab assistance to qualifying candidates.   Vocational Rehab Evaluation & Intervention:  Vocational Rehab - 06/07/21 1414       Initial Vocational Rehab Evaluation & Intervention   Assessment shows need for Vocational Rehabilitation No             Education: Education Goals: Education classes will be provided on a variety of topics geared toward better understanding of heart health and risk factor modification. Participant will state understanding/return demonstration of topics presented as noted by education test scores.  Learning Barriers/Preferences:  Learning Barriers/Preferences - 06/07/21 1413       Learning Barriers/Preferences   Learning Barriers Sight   reading glasses, eye injections   Learning Preferences Skilled Demonstration             General Cardiac Education Topics:  AED/CPR: - Group verbal and written instruction with the use of models to demonstrate the basic use of the AED with the basic ABC's of resuscitation.   Anatomy  and Cardiac Procedures: - Group verbal and visual presentation and models provide information about basic cardiac anatomy and function. Reviews the testing methods done to diagnose heart disease and the outcomes of the test results. Describes the treatment choices: Medical Management, Angioplasty, or Coronary Bypass Surgery for treating various heart conditions including Myocardial Infarction, Angina, Valve Disease, and Cardiac Arrhythmias.  Written material given at graduation. Flowsheet Row Cardiac Rehab from [facility]/13/20[facility] Cardiac [facility] Pul[facility]hab  Education need identified 06/08/21       Medication Safety: - Group verbal and visual instruction to review commonly prescribed medications for heart and lung disease. Reviews the medication, class of the drug, and side effects. Includes the steps to properly store meds and maintain the prescription regimen.  Written material given at graduation. Flowsheet Row Card[facility]  Date 06/15/21  Educator [facility]  Instruction Review Code 1- Verbalizes Understanding       Intimacy: - Group verbal instruction through game format to discuss how heart and lung disease can affect sexual intimacy. Written material given at graduation..   Know Your Numbers and Heart Failure: - Group verbal and visual instruction to discuss disease risk factors for cardiac and pulmonary disease and treatment options.  Reviews associated critical values for Overweight/Obesity, Hypertension, Cholesterol, and Diabetes.  Discusses basics of heart failure: signs/symptoms and treatments.  Introduces Heart Failure Zone chart for action plan for heart failure.  Written material given at graduation. Flowsheet Row Cardiac Rehab from [facility]13/2021 in [facility] Cardiac and Pulmonary Rehab  Date 06/22/21  Educator [facility]  Instruction Review Code 1- Verbalizes Understanding       Infection Prevention: - Provides verbal and written material to  individual with discussion of infection control including proper hand washing and proper equipment cleaning during exercise session. Flowsheet Row Cardiac Rehab from 07/13/2021 in [facility] Cardiac and Pulmonary Rehab  Date 06/08/21  Educator [facility]  Instruction Review Code 1- Verbalizes Understanding       Falls Prevention: - Provides verbal and written material to individual with discussion of falls prevention and safety. Flowsheet Row Cardiac Rehab from 07/13/2021 in [facility] Cardiac and Pulmonary Rehab  Date 06/08/21  Educator [facility]  Instruction Review Code 1- Verbalizes Understanding       Other: -Provides group and verbal instruction on various topics (see comments)   Knowledge Questionnaire Score:  Knowledge Questionnaire Score - 06/08/21 1540       Knowledge Questionnaire Score   Pre Score 22/24 Education Focus: exercise, nutrition, angina             Core Components/Risk Factors/Patient Goals at Admission:  Personal Goals and Risk Factors at Admission - 06/08/21 1540       Core Components/Risk Factors/Patient Goals on Admission    Weight Management Yes;Weight Loss    Intervention Weight Management: Develop a combined nutrition and exercise program designed to reach desired caloric intake, while maintaining appropriate intake of nutrient and fiber, sodium and fats, and appropriate energy expenditure required for the weight goal.;Weight Management: Provide education and appropriate resources to help participant work on and attain dietary goals.    Admit Weight 111 lb 4.8 oz (50.5 kg)    Goal Weight: Short Term 105 lb (47.6 kg)    Goal Weight: Long Term 105 lb (47.6 kg)    Expected Outcomes Short Term: Continue to assess and modify interventions until short term weight is achieved;Long Term: Adherence to nutrition and physical activity/exercise program aimed toward attainment of established weight goal;Weight Loss: Understanding of general recommendations for a balanced deficit meal  plan, which promotes 1-2 lb weight loss per week and includes a negative energy balance of [phone] kcal/d;Understanding recommendations for meals to include 15-35% energy as protein, 25-35% energy from fat, 35-60% energy from carbohydrates, less than  of dietary cholesterol, 20-35 gm of total fiber daily;Understanding of distribution of calorie intake throughout the day with the consumption of 4-5 meals/snacks    Diabetes Yes    Intervention Provide education about signs/symptoms and action to take for hypo/hyperglycemia.;Provide education about proper nutrition, including hydration, and aerobic/resistive exercise prescription along with prescribed medications to achieve blood glucose in normal ranges: Fasting glucose 65-99 mg/dL    Expected Outcomes Short Term: Participant verbalizes understanding of the signs/symptoms and immediate care of hyper/hypoglycemia, proper foot care and importance of medication, aerobic/resistive exercise and nutrition plan for blood glucose control.;Long Term: Attainment of HbA1C < 7%.    Heart Failure Yes    Intervention Provide a combined exercise and nutrition program that is supplemented with education, support and counseling about heart failure. Directed toward relieving symptoms such as shortness of breath, decreased exercise tolerance, and extremity edema.    Expected Outcomes Improve functional capacity of life;Short term: Attendance in program 2-3 days a week with increased exercise capacity. Reported lower sodium intake. Reported increased fruit and vegetable intake. Reports medication compliance.;Short term: Daily weights obtained and reported for increase. Utilizing diuretic protocols set by physician.;Long term: Adoption of self-care skills and reduction of barriers for early signs and symptoms recognition and intervention leading to self-care  maintenance.    Hypertension Yes    Intervention Provide education on lifestyle modifcations including regular  physical activity/exercise, weight management, moderate sodium restriction and increased consumption of fresh fruit, vegetables, and low fat dairy, alcohol moderation, and smoking cessation.;Monitor prescription use compliance.    Expected Outcomes Long Term: Maintenance of blood pressure at goal levels.;Short Term: Continued assessment and intervention until BP is < 140/59mm HG in hypertensive participants. < 130/32mm HG in hypertensive participants with diabetes, heart failure or chronic kidney disease.    Lipids Yes    Intervention Provide education and support for participant on nutrition & aerobic/resistive exercise along with prescribed medications to achieve LDL 70mg , HDL >40mg .    Expected Outcomes Short Term: Participant states understanding of desired cholesterol values and is compliant with medications prescribed. Participant is following exercise prescription and nutrition guidelines.;Long Term: Cholesterol controlled with medications as prescribed, with individualized exercise RX and with personalized nutrition plan. Value goals: LDL < , HDL > 40 mg.             Education:Diabetes - Individual verbal and written instruction to review signs/symptoms of diabetes, desired ranges of glucose level fasting, after meals and with exercise. Acknowledge that pre and post exercise glucose checks will be done for 3 sessions at entry of program. Flowsheet Row Cardiac Rehab from 07/13/2021 in [facility] Cardiac and Pulmonary Rehab  Date 06/08/21  Educator [facility]  Instruction Review Code 1- Verbalizes Understanding       Core Components/Risk Factors/Patient Goals Review:    Core Components/Risk Factors/Patient Goals at Discharge (Final Review):    ITP Comments:  ITP Comments     Row Name 06/07/21 1423 06/08/21 1533 06/15/21 1616 06/16/21 1543 07/06/21 0827   ITP Comments Completed virtual orientation today.  EP evaluation is scheduled for Wed 7/13 at 1330.  Documentation for diagnosis can be  found in [facility] encounter 05/15/2021. Completed and gym orientation. Initial ITP created and sent for review to Dr. Bethann Punches, Medical Director. Kylie Thomas did not complete her rehab session.  She was unable to exercise today due to elevated BG of 379. She has been in contact with her doctor and they are working on improving her medication regimen to address BG elevation. She was able to stay and attend the education class today. First full day of exercise!  Patient was oriented to gym and equipment including functions, settings, policies, and procedures.  Patient's individual exercise prescription and treatment plan were reviewed.  All starting workloads were established based on the results of the 6 minute walk test done at initial orientation visit.  The plan for exercise progression was also introduced and progression will be customized based on patient's performance and goals. 30 Day review completed. Medical Director ITP review done, changes made as directed, and signed approval by Medical Director.    New to program    Row Name 07/07/21 1549 07/07/21 1640 07/13/21 1535 07/20/21 1615 07/26/21 1444   ITP Comments Kylie Thomas did not complete her rehab session.  Her blood sugar level was checked, resulted as 456. Patient was asymptomatic and stated she had not taken her insulin. She was encouraged to take her insulin and continue to monitor her blood sugar level and contact her doctor should the blood sugar level not decrease with treatment. Patient stated understanding and had no further questions at this time. Kylie Thomas did not complete her rehab session.  Her blood sugar level was checked, resulted as 456. Patient was asymptomatic  and stated she had not taken her insulin. She was encouraged to take her insulin and continue to monitor her blood sugar level and contact her doctor should the blood sugar level not decrease with treatment. Patient stated understanding and had no further questions  at this time. Patient has been in communication with her MD concerning her blood sugar for the past few weeks. Told patient to check blood sugar before coming to rehab; if above 300 then she should stay home. Pt able to follow exercise prescription today without complaint.  Blood glucose was 164. Will continue to monitor for progression. Pt stated that she will be having heart surgery next week, on Tuesday, August 30th. We will discharge paitent at this time as she is currently admitted for CABG at [facility].  She has completed 11 sessions and will need a new referral post surgery.            Comments: Discharge ITP

## 2021-07-26 NOTE — Progress Notes (Signed)
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)  Status post coronary artery stent placement  ADL [facility]SD:   Initial Exercise Prescription:  Initial Exercise Prescription - 06/08/21 1500       Date of Initial Exercise RX and Referring Provider   Date 06/08/21    Referring Provider [name] MD      Treadmill   MPH 2    Grade 1    Minutes 15    METs 2.81      Arm Ergometer   Level 1    Watts 25    RPM 30    Minutes 15    METs 2      REL-XR   Level 2    Speed 50    Minutes 15    METs 2      Track   Laps 29    Minutes 15    METs 2.58      Prescription Details   Frequency (times per week) 3    Duration Progress to 30 minutes of continuous aerobic without signs/symptoms of physical distress      Intensity   THRR 40-80% of Max Heartrate 98-137    Ratings of Perceived Exertion 11-13    Perceived Dyspnea 0-4      Progression   Progression Continue to progress workloads to maintain intensity without signs/symptoms of physical distress.      Resistance Training   Training Prescription Yes    Weight 3 lb    Reps 10-15             Discharge Exercise Prescription (Final Exercise Prescription Changes):  Exercise Prescription Changes - 07/25/21 0900       Response to Exercise   Blood Pressure (Admit) 102/64    Blood Pressure (Exit) 110/58    Heart Rate (Admit) 81 bpm    Heart Rate (Exercise) 127 bpm    Heart Rate (Exit) 81 bpm    Rating of Perceived Exertion  (Exercise) 15    Symptoms none    Duration Continue with 30 min of aerobic exercise without signs/symptoms of physical distress.    Intensity THRR unchanged      Progression   Progression Continue to progress workloads to maintain intensity without signs/symptoms of physical distress.    Average METs 2.52      Resistance Training   Training Prescription Yes    Weight 3 lb    Reps 10-15      Interval Training   Interval Training No      Biostep-RELP   Level 1    Minutes 15    METs 2      Track   Laps 35    Minutes 15    METs 2.9             Functional Capacity:  6 Minute Walk     Row Name 06/08/21 1534         6 Minute Walk   Phase Initial  Distance 1030 feet     Walk Time 6 minutes     # of Rest Breaks 0     MPH 1.95     METS 2.6     RPE 10     Perceived Dyspnea  1     VO2 Peak 9.1     Symptoms Yes (comment)     Comments SOB     Resting HR 63 bpm     Resting BP 122/70     Resting Oxygen Saturation  99 %     Exercise Oxygen Saturation  during 6 min walk 99 %     Max Ex. HR 97 bpm     Max Ex. BP 128/74     2 Minute Post BP 126/64              Psychological, QOL, Others - Outcomes: PHQ 2/9: Depression screen [facility] 2/9 06/08/2021 06/01/2021  Decreased Interest 1 0  Down, Depressed, Hopeless 1 0  PHQ - 2 Score 2 0  Altered sleeping 2 -  Tired, decreased energy 2 -  Change in appetite 2 -  Feeling bad or failure about yourself  2 -  Trouble concentrating 0 -  Moving slowly or fidgety/restless 1 -  Suicidal thoughts 0 -  PHQ-9 Score 11 -  Difficult doing work/chores Somewhat difficult -    Quality of Life:  Quality of Life - 06/08/21 1539       Quality of Life   Select Quality of Life      Quality of Life Scores   Health/Function Pre 16.64 %    Socioeconomic Pre 19.79 %    Psych/Spiritual Pre 21.79 %    Family Pre 25.5 %    GLOBAL Pre 19.56 %             Personal Goals: Goals established at orientation with interventions  provided to work toward goal.  Personal Goals and Risk Factors at Admission - 06/08/21 1540       Core Components/Risk Factors/Patient Goals on Admission    Weight Management Yes;Weight Loss    Intervention Weight Management: Develop a combined nutrition and exercise program designed to reach desired caloric intake, while maintaining appropriate intake of nutrient and fiber, sodium and fats, and appropriate energy expenditure required for the weight goal.;Weight Management: Provide education and appropriate resources to help participant work on and attain dietary goals.    Admit Weight 111 lb 4.8 oz (50.5 kg)    Goal Weight: Short Term 105 lb (47.6 kg)    Goal Weight: Long Term 105 lb (47.6 kg)    Expected Outcomes Short Term: Continue to assess and modify interventions until short term weight is achieved;Long Term: Adherence to nutrition and physical activity/exercise program aimed toward attainment of established weight goal;Weight Loss: Understanding of general recommendations for a balanced deficit meal plan, which promotes 1-2 lb weight loss per week and includes a negative energy balance of [phone] kcal/d;Understanding recommendations for meals to include 15-35% energy as protein, 25-35% energy from fat, 35-60% energy from carbohydrates, less than 200mg  of dietary cholesterol, 20-35 gm of total fiber daily;Understanding of distribution of calorie intake throughout the day with the consumption of 4-5 meals/snacks    Diabetes Yes    Intervention Provide education about signs/symptoms and action to take for hypo/hyperglycemia.;Provide education about proper nutrition, including hydration, and aerobic/resistive exercise prescription along with prescribed medications to achieve blood glucose in normal ranges: Fasting glucose 65-99 mg/dL    Expected Outcomes Short  Term: Participant verbalizes understanding of the signs/symptoms and immediate care of hyper/hypoglycemia, proper foot care and importance  of medication, aerobic/resistive exercise and nutrition plan for blood glucose control.;Long Term: Attainment of HbA1C < 7%.    Heart Failure Yes    Intervention Provide a combined exercise and nutrition program that is supplemented with education, support and counseling about heart failure. Directed toward relieving symptoms such as shortness of breath, decreased exercise tolerance, and extremity edema.    Expected Outcomes Improve functional capacity of life;Short term: Attendance in program 2-3 days a week with increased exercise capacity. Reported lower sodium intake. Reported increased fruit and vegetable intake. Reports medication compliance.;Short term: Daily weights obtained and reported for increase. Utilizing diuretic protocols set by physician.;Long term: Adoption of self-care skills and reduction of barriers for early signs and symptoms recognition and intervention leading to self-care maintenance.    Hypertension Yes    Intervention Provide education on lifestyle modifcations including regular physical activity/exercise, weight management, moderate sodium restriction and increased consumption of fresh fruit, vegetables, and low fat dairy, alcohol moderation, and smoking cessation.;Monitor prescription use compliance.    Expected Outcomes Long Term: Maintenance of blood pressure at goal levels.;Short Term: Continued assessment and intervention until BP is < 140/60mm HG in hypertensive participants. < 130/21mm HG in hypertensive participants with diabetes, heart failure or chronic kidney disease.    Lipids Yes    Intervention Provide education and support for participant on nutrition & aerobic/resistive exercise along with prescribed medications to achieve LDL 70mg , HDL >40mg .    Expected Outcomes Short Term: Participant states understanding of desired cholesterol values and is compliant with medications prescribed. Participant is following exercise prescription and nutrition guidelines.;Long  Term: Cholesterol controlled with medications as prescribed, with individualized exercise RX and with personalized nutrition plan. Value goals: LDL < 70mg , HDL > 40 mg.              Personal Goals Discharge:   Exercise Goals and Review:  Exercise Goals     Row Name 06/08/21 1536             Exercise Goals   Increase Physical Activity Yes       Intervention Provide advice, education, support and counseling about physical activity/exercise needs.;Develop an individualized exercise prescription for aerobic and resistive training based on initial evaluation findings, risk stratification, comorbidities and participant's personal goals.       Expected Outcomes Short Term: Attend rehab on a regular basis to increase amount of physical activity.;Long Term: Add in home exercise to make exercise part of routine and to increase amount of physical activity.;Long Term: Exercising regularly at least 3-5 days a week.       Increase Strength and Stamina Yes       Intervention Provide advice, education, support and counseling about physical activity/exercise needs.;Develop an individualized exercise prescription for aerobic and resistive training based on initial evaluation findings, risk stratification, comorbidities and participant's personal goals.       Expected Outcomes Short Term: Increase workloads from initial exercise prescription for resistance, speed, and METs.;Short Term: Perform resistance training exercises routinely during rehab and add in resistance training at home;Long Term: Improve cardiorespiratory fitness, muscular endurance and strength as measured by increased METs and functional capacity (06/10/21)       Able to understand and use rate of perceived exertion (RPE) scale Yes       Intervention Provide education and explanation on how to use RPE scale       Expected  Outcomes Short Term: Able to use RPE daily in rehab to express subjective intensity level;Long Term:  Able to use RPE to  guide intensity level when exercising independently       Able to understand and use Dyspnea scale Yes       Intervention Provide education and explanation on how to use Dyspnea scale       Expected Outcomes Short Term: Able to use Dyspnea scale daily in rehab to express subjective sense of shortness of breath during exertion;Long Term: Able to use Dyspnea scale to guide intensity level when exercising independently       Knowledge and understanding of Target Heart Rate Range (THRR) Yes       Intervention Provide education and explanation of THRR including how the numbers were predicted and where they are located for reference       Expected Outcomes Short Term: Able to state/look up THRR;Long Term: Able to use THRR to govern intensity when exercising independently;Short Term: Able to use daily as guideline for intensity in rehab       Able to check pulse independently Yes       Intervention Provide education and demonstration on how to check pulse in carotid and radial arteries.;Review the importance of being able to check your own pulse for safety during independent exercise       Expected Outcomes Long Term: Able to check pulse independently and accurately;Short Term: Able to explain why pulse checking is important during independent exercise       Understanding of Exercise Prescription Yes       Intervention Provide education, explanation, and written materials on patient's individual exercise prescription       Expected Outcomes Short Term: Able to explain program exercise prescription;Long Term: Able to explain home exercise prescription to exercise independently                Exercise Goals Re-Evaluation:  Exercise Goals Re-Evaluation     Row Name 06/16/21 1547 06/28/21 0857 07/13/21 0804 07/25/21 0929       Exercise Goal Re-Evaluation   Exercise Goals Review Increase Physical Activity;Able to understand and use rate of perceived exertion (RPE) scale;Knowledge and understanding of  Target Heart Rate Range (THRR);Understanding of Exercise Prescription;Increase Strength and Stamina;Able to check pulse independently Increase Physical Activity;Increase Strength and Stamina;Understanding of Exercise Prescription Increase Physical Activity;Increase Strength and Stamina Increase Physical Activity;Increase Strength and Stamina    Comments Reviewed RPE and dyspnea scales, THR and program prescription with pt today.  Pt voiced understanding and was given a copy of goals to take home. Carney BernJean is off to a good start in rehab.  Her attendance is dependent on her blood sugars and we have encouraged her to talk with her doctor for improved control.  She only got 13 laps on track and we will encourage to walk faster on track as she is at 2.0 mph on treadmill which would be equal to 28 laps.  We will continue to monitor her progress. Carney BernJean will be out for unknown time due to having a CABG scheduled for next week. Carney BernJean has not had full sessions of exercise continuously due to her blood sugars which has been verbalized she needs to talk to her doctor and follow up closely. Carney BernJean will be getting discharged as she is supposed to have a CABG.    Expected Outcomes Short: Use RPE daily to regulate intensity. Long: Follow program prescription in THR. Short: Continue to attend regularly and walk faster  Long: Continue to follow program prescription -- No goals at this time due to discharge             Nutrition & Weight - Outcomes:  Pre Biometrics - 06/08/21 1536       Pre Biometrics   Height 5' 1.5" (1.562 m)    Weight 111 lb 4.8 oz (50.5 kg)    BMI (Calculated) 20.69    Single Leg Stand 12 seconds              Nutrition:  Nutrition Therapy & Goals - 06/07/21 1414       Intervention Plan   Intervention Prescribe, educate and counsel regarding individualized specific dietary modifications aiming towards targeted core components such as weight, hypertension, lipid management, diabetes, heart  failure and other comorbidities.    Expected Outcomes Short Term Goal: Understand basic principles of dietary content, such as calories, fat, sodium, cholesterol and nutrients.;Short Term Goal: A plan has been developed with personal nutrition goals set during dietitian appointment.;Long Term Goal: Adherence to prescribed nutrition plan.             Nutrition Discharge:   Education Questionnaire Score:  Knowledge Questionnaire Score - 06/08/21 1540       Knowledge Questionnaire Score   Pre Score 22/24 Education Focus: exercise, nutrition, angina             Goals reviewed with patient; copy given to patient.

## 2021-10-10 ENCOUNTER — Other Ambulatory Visit: Payer: Self-pay

## 2021-10-10 ENCOUNTER — Emergency Department: Payer: Medicare PPO

## 2021-10-10 ENCOUNTER — Inpatient Hospital Stay
Admission: EM | Admit: 2021-10-10 | Discharge: 2021-10-15 | DRG: 291 | Disposition: A | Discharge status: Skilled Nursing Facility | Payer: Medicare PPO | Attending: Internal Medicine | Admitting: Internal Medicine

## 2021-10-10 DIAGNOSIS — I959 Hypotension, unspecified: Secondary | ICD-10-CM | POA: Diagnosis not present

## 2021-10-10 DIAGNOSIS — I130 Hypertensive heart and chronic kidney disease with heart failure and stage 1 through stage 4 chronic kidney disease, or unspecified chronic kidney disease: Principal | ICD-10-CM | POA: Diagnosis present

## 2021-10-10 DIAGNOSIS — I951 Orthostatic hypotension: Secondary | ICD-10-CM | POA: Diagnosis not present

## 2021-10-10 DIAGNOSIS — I480 Paroxysmal atrial fibrillation: Secondary | ICD-10-CM | POA: Diagnosis present

## 2021-10-10 DIAGNOSIS — Z952 Presence of prosthetic heart valve: Secondary | ICD-10-CM | POA: Diagnosis not present

## 2021-10-10 DIAGNOSIS — I482 Chronic atrial fibrillation, unspecified: Secondary | ICD-10-CM | POA: Diagnosis present

## 2021-10-10 DIAGNOSIS — K219 Gastro-esophageal reflux disease without esophagitis: Secondary | ICD-10-CM | POA: Diagnosis present

## 2021-10-10 DIAGNOSIS — R0600 Dyspnea, unspecified: Secondary | ICD-10-CM

## 2021-10-10 DIAGNOSIS — Z794 Long term (current) use of insulin: Secondary | ICD-10-CM

## 2021-10-10 DIAGNOSIS — L97528 Non-pressure chronic ulcer of other part of left foot with other specified severity: Secondary | ICD-10-CM | POA: Diagnosis present

## 2021-10-10 DIAGNOSIS — R0602 Shortness of breath: Secondary | ICD-10-CM

## 2021-10-10 DIAGNOSIS — I2510 Atherosclerotic heart disease of native coronary artery without angina pectoris: Secondary | ICD-10-CM | POA: Diagnosis present

## 2021-10-10 DIAGNOSIS — Z79899 Other long term (current) drug therapy: Secondary | ICD-10-CM

## 2021-10-10 DIAGNOSIS — R059 Cough, unspecified: Secondary | ICD-10-CM

## 2021-10-10 DIAGNOSIS — J918 Pleural effusion in other conditions classified elsewhere: Secondary | ICD-10-CM | POA: Diagnosis present

## 2021-10-10 DIAGNOSIS — I442 Atrioventricular block, complete: Secondary | ICD-10-CM | POA: Diagnosis present

## 2021-10-10 DIAGNOSIS — Z7902 Long term (current) use of antithrombotics/antiplatelets: Secondary | ICD-10-CM

## 2021-10-10 DIAGNOSIS — N183 Chronic kidney disease, stage 3 unspecified: Secondary | ICD-10-CM | POA: Diagnosis present

## 2021-10-10 DIAGNOSIS — Z833 Family history of diabetes mellitus: Secondary | ICD-10-CM

## 2021-10-10 DIAGNOSIS — E785 Hyperlipidemia, unspecified: Secondary | ICD-10-CM | POA: Diagnosis present

## 2021-10-10 DIAGNOSIS — Z23 Encounter for immunization: Secondary | ICD-10-CM | POA: Diagnosis present

## 2021-10-10 DIAGNOSIS — L8990 Pressure ulcer of unspecified site, unspecified stage: Secondary | ICD-10-CM | POA: Diagnosis not present

## 2021-10-10 DIAGNOSIS — Z882 Allergy status to sulfonamides status: Secondary | ICD-10-CM

## 2021-10-10 DIAGNOSIS — E1169 Type 2 diabetes mellitus with other specified complication: Secondary | ICD-10-CM | POA: Diagnosis present

## 2021-10-10 DIAGNOSIS — I5043 Acute on chronic combined systolic (congestive) and diastolic (congestive) heart failure: Secondary | ICD-10-CM | POA: Diagnosis present

## 2021-10-10 DIAGNOSIS — Z888 Allergy status to other drugs, medicaments and biological substances status: Secondary | ICD-10-CM

## 2021-10-10 DIAGNOSIS — D539 Nutritional anemia, unspecified: Secondary | ICD-10-CM | POA: Diagnosis present

## 2021-10-10 DIAGNOSIS — R0902 Hypoxemia: Secondary | ICD-10-CM

## 2021-10-10 DIAGNOSIS — I509 Heart failure, unspecified: Secondary | ICD-10-CM

## 2021-10-10 DIAGNOSIS — Z7901 Long term (current) use of anticoagulants: Secondary | ICD-10-CM | POA: Diagnosis not present

## 2021-10-10 DIAGNOSIS — N182 Chronic kidney disease, stage 2 (mild): Secondary | ICD-10-CM | POA: Diagnosis not present

## 2021-10-10 DIAGNOSIS — J9601 Acute respiratory failure with hypoxia: Secondary | ICD-10-CM | POA: Diagnosis present

## 2021-10-10 DIAGNOSIS — E11621 Type 2 diabetes mellitus with foot ulcer: Secondary | ICD-10-CM | POA: Diagnosis present

## 2021-10-10 DIAGNOSIS — J9621 Acute and chronic respiratory failure with hypoxia: Secondary | ICD-10-CM | POA: Diagnosis present

## 2021-10-10 DIAGNOSIS — J90 Pleural effusion, not elsewhere classified: Secondary | ICD-10-CM | POA: Diagnosis present

## 2021-10-10 DIAGNOSIS — E1122 Type 2 diabetes mellitus with diabetic chronic kidney disease: Secondary | ICD-10-CM | POA: Diagnosis present

## 2021-10-10 DIAGNOSIS — Z950 Presence of cardiac pacemaker: Secondary | ICD-10-CM | POA: Diagnosis present

## 2021-10-10 DIAGNOSIS — L89891 Pressure ulcer of other site, stage 1: Secondary | ICD-10-CM | POA: Diagnosis present

## 2021-10-10 DIAGNOSIS — I10 Essential (primary) hypertension: Secondary | ICD-10-CM | POA: Diagnosis present

## 2021-10-10 DIAGNOSIS — Z8249 Family history of ischemic heart disease and other diseases of the circulatory system: Secondary | ICD-10-CM

## 2021-10-10 DIAGNOSIS — N1831 Chronic kidney disease, stage 3a: Secondary | ICD-10-CM | POA: Diagnosis present

## 2021-10-10 DIAGNOSIS — Z20822 Contact with and (suspected) exposure to covid-19: Secondary | ICD-10-CM | POA: Diagnosis present

## 2021-10-10 DIAGNOSIS — E1165 Type 2 diabetes mellitus with hyperglycemia: Secondary | ICD-10-CM | POA: Diagnosis present

## 2021-10-10 DIAGNOSIS — D649 Anemia, unspecified: Secondary | ICD-10-CM | POA: Diagnosis not present

## 2021-10-10 DIAGNOSIS — Z951 Presence of aortocoronary bypass graft: Secondary | ICD-10-CM

## 2021-10-10 DIAGNOSIS — Z9889 Other specified postprocedural states: Secondary | ICD-10-CM

## 2021-10-10 DIAGNOSIS — I5023 Acute on chronic systolic (congestive) heart failure: Secondary | ICD-10-CM | POA: Diagnosis present

## 2021-10-10 LAB — CBC
HCT: 33.1 % — ABNORMAL LOW (ref 36.0–46.0)
Hemoglobin: 10 g/dL — ABNORMAL LOW (ref 12.0–15.0)
MCH: 28.5 pg (ref 26.0–34.0)
MCHC: 30.2 g/dL (ref 30.0–36.0)
MCV: 94.3 fL (ref 80.0–100.0)
Platelets: 329 10*3/uL (ref 150–400)
RBC: 3.51 MIL/uL — ABNORMAL LOW (ref 3.87–5.11)
RDW: 16 % — ABNORMAL HIGH (ref 11.5–15.5)
WBC: 8.7 10*3/uL (ref 4.0–10.5)
nRBC: 0 % (ref 0.0–0.2)

## 2021-10-10 LAB — BASIC METABOLIC PANEL
Anion gap: 9 (ref 5–15)
BUN: 25 mg/dL — ABNORMAL HIGH (ref 8–23)
CO2: 32 mmol/L (ref 22–32)
Calcium: 9.6 mg/dL (ref 8.9–10.3)
Chloride: 92 mmol/L — ABNORMAL LOW (ref 98–111)
Creatinine, Ser: 1.09 mg/dL — ABNORMAL HIGH (ref 0.44–1.00)
GFR, Estimated: 54 mL/min — ABNORMAL LOW (ref >60)
Glucose, Bld: 232 mg/dL — ABNORMAL HIGH (ref 70–99)
Potassium: 4.4 mmol/L (ref 3.5–5.1)
Sodium: 133 mmol/L — ABNORMAL LOW (ref 135–145)

## 2021-10-10 LAB — RESP PANEL BY RT-PCR (FLU A&B, COVID) ARPGX2
Influenza A by PCR: NEGATIVE
Influenza B by PCR: NEGATIVE
SARS Coronavirus 2 by RT PCR: NEGATIVE

## 2021-10-10 LAB — CBG MONITORING, ED: Glucose-Capillary: 201 mg/dL — ABNORMAL HIGH (ref 70–99)

## 2021-10-10 LAB — BRAIN NATRIURETIC PEPTIDE: B Natriuretic Peptide: 492.2 pg/mL — ABNORMAL HIGH (ref 0.0–100.0)

## 2021-10-10 LAB — TROPONIN I (HIGH SENSITIVITY): Troponin I (High Sensitivity): 23 ng/L — ABNORMAL HIGH (ref <18)

## 2021-10-10 MED ORDER — ONDANSETRON HCL 4 MG/2ML IJ SOLN
4.0000 mg | Freq: Four times a day (QID) | INTRAMUSCULAR | Status: DC | PRN
  Administered 2021-10-11 – 2021-10-13 (×4): 4 mg via INTRAVENOUS
  Filled 2021-10-10 (×4): qty 2

## 2021-10-10 MED ORDER — LEVOTHYROXINE SODIUM 112 MCG PO TABS
112.0000 ug | ORAL_TABLET | Freq: Every day | ORAL | Status: DC
  Administered 2021-10-11 – 2021-10-15 (×5): 112 ug via ORAL
  Filled 2021-10-10 (×5): qty 1

## 2021-10-10 MED ORDER — METOPROLOL SUCCINATE ER 25 MG PO TB24
12.5000 mg | ORAL_TABLET | Freq: Every day | ORAL | Status: DC
  Filled 2021-10-10 (×2): qty 0.5

## 2021-10-10 MED ORDER — BISACODYL 5 MG PO TBEC: 5.0000 mg | DELAYED_RELEASE_TABLET | Freq: Every day | ORAL | Status: DC | PRN

## 2021-10-10 MED ORDER — FUROSEMIDE 10 MG/ML IJ SOLN
40.0000 mg | Freq: Two times a day (BID) | INTRAMUSCULAR | Status: DC
  Administered 2021-10-11: 40 mg via INTRAVENOUS
  Filled 2021-10-10: qty 4

## 2021-10-10 MED ORDER — MELATONIN 5 MG PO TABS: 5.0000 mg | ORAL_TABLET | Freq: Every evening | ORAL | Status: DC | PRN

## 2021-10-10 MED ORDER — DORZOLAMIDE HCL-TIMOLOL MAL 2-0.5 % OP SOLN
1.0000 [drp] | Freq: Two times a day (BID) | OPHTHALMIC | Status: DC
  Administered 2021-10-11 – 2021-10-15 (×10): 1 [drp] via OPHTHALMIC
  Filled 2021-10-10 (×2): qty 10

## 2021-10-10 MED ORDER — SODIUM CHLORIDE 0.9 % IV SOLN
250.0000 mL | INTRAVENOUS | Status: DC | PRN
  Administered 2021-10-11 – 2021-10-12 (×2): 250 mL via INTRAVENOUS

## 2021-10-10 MED ORDER — INSULIN ASPART 100 UNIT/ML IJ SOLN
0.0000 [IU] | Freq: Three times a day (TID) | INTRAMUSCULAR | Status: DC
  Administered 2021-10-11: 15 [IU] via SUBCUTANEOUS
  Administered 2021-10-11: 8 [IU] via SUBCUTANEOUS
  Administered 2021-10-11: 5 [IU] via SUBCUTANEOUS
  Administered 2021-10-12: 2 [IU] via SUBCUTANEOUS
  Administered 2021-10-12: 3 [IU] via SUBCUTANEOUS
  Administered 2021-10-13 (×2): 8 [IU] via SUBCUTANEOUS
  Administered 2021-10-14 (×2): 3 [IU] via SUBCUTANEOUS
  Administered 2021-10-14: 2 [IU] via SUBCUTANEOUS
  Administered 2021-10-15 (×2): 5 [IU] via SUBCUTANEOUS
  Filled 2021-10-10 (×12): qty 1

## 2021-10-10 MED ORDER — POLYETHYLENE GLYCOL 3350 17 G PO PACK
17.0000 g | PACK | Freq: Every day | ORAL | Status: DC | PRN
  Administered 2021-10-14: 17 g via ORAL
  Filled 2021-10-10: qty 1

## 2021-10-10 MED ORDER — INSULIN GLARGINE-YFGN 100 UNIT/ML ~~LOC~~ SOLN
15.0000 [IU] | Freq: Every day | SUBCUTANEOUS | Status: DC
  Administered 2021-10-11: 15 [IU] via SUBCUTANEOUS
  Filled 2021-10-10 (×2): qty 0.15

## 2021-10-10 MED ORDER — SODIUM CHLORIDE 0.9% FLUSH
3.0000 mL | INTRAVENOUS | Status: DC | PRN
  Administered 2021-10-10: 3 mL via INTRAVENOUS

## 2021-10-10 MED ORDER — ACETAMINOPHEN 325 MG PO TABS
650.0000 mg | ORAL_TABLET | ORAL | Status: DC | PRN
  Administered 2021-10-12: 650 mg via ORAL
  Filled 2021-10-10: qty 2

## 2021-10-10 MED ORDER — ALBUTEROL SULFATE HFA 108 (90 BASE) MCG/ACT IN AERS
2.0000 | INHALATION_SPRAY | RESPIRATORY_TRACT | Status: DC | PRN
  Filled 2021-10-10: qty 6.7

## 2021-10-10 MED ORDER — DULOXETINE HCL 30 MG PO CPEP
60.0000 mg | ORAL_CAPSULE | Freq: Two times a day (BID) | ORAL | Status: DC
  Administered 2021-10-10 – 2021-10-15 (×10): 60 mg via ORAL
  Filled 2021-10-10: qty 2
  Filled 2021-10-10 (×2): qty 1
  Filled 2021-10-10 (×7): qty 2

## 2021-10-10 MED ORDER — FUROSEMIDE 10 MG/ML IJ SOLN
40.0000 mg | Freq: Once | INTRAMUSCULAR | Status: AC
  Administered 2021-10-10: 40 mg via INTRAVENOUS
  Filled 2021-10-10: qty 4

## 2021-10-10 MED ORDER — ASPIRIN 81 MG PO CHEW
81.0000 mg | CHEWABLE_TABLET | Freq: Every day | ORAL | Status: DC
  Administered 2021-10-11 – 2021-10-15 (×5): 81 mg via ORAL
  Filled 2021-10-10 (×5): qty 1

## 2021-10-10 MED ORDER — ROSUVASTATIN CALCIUM 10 MG PO TABS
40.0000 mg | ORAL_TABLET | Freq: Every morning | ORAL | Status: DC
  Administered 2021-10-11 – 2021-10-15 (×5): 40 mg via ORAL
  Filled 2021-10-10 (×2): qty 4
  Filled 2021-10-10: qty 2
  Filled 2021-10-10: qty 4
  Filled 2021-10-10: qty 2
  Filled 2021-10-10: qty 4

## 2021-10-10 MED ORDER — INSULIN ASPART 100 UNIT/ML IJ SOLN
0.0000 [IU] | Freq: Every day | INTRAMUSCULAR | Status: DC
  Administered 2021-10-10: 2 [IU] via SUBCUTANEOUS
  Administered 2021-10-12: 3 [IU] via SUBCUTANEOUS
  Filled 2021-10-10 (×2): qty 1

## 2021-10-10 MED ORDER — FUROSEMIDE 10 MG/ML IJ SOLN: 60.0000 mg | Freq: Once | INTRAMUSCULAR | Status: DC

## 2021-10-10 MED ORDER — SODIUM CHLORIDE 0.9% FLUSH
3.0000 mL | Freq: Two times a day (BID) | INTRAVENOUS | Status: DC
  Administered 2021-10-10 – 2021-10-15 (×10): 3 mL via INTRAVENOUS

## 2021-10-10 MED ORDER — PANTOPRAZOLE SODIUM 40 MG PO TBEC
40.0000 mg | DELAYED_RELEASE_TABLET | Freq: Every day | ORAL | Status: DC
  Administered 2021-10-11 – 2021-10-15 (×5): 40 mg via ORAL
  Filled 2021-10-10 (×5): qty 1

## 2021-10-10 NOTE — ED Triage Notes (Signed)
First Nurse Note:  Arrives from home c/o SOB.  Patient with lower extremity swelling x 3 days. VS wnl.  Currently off of Eliquis x 3 days due to nose bleeds.  EMS reports a paced rhythm.  NAD

## 2021-10-10 NOTE — ED Notes (Signed)
Ultrasound in room

## 2021-10-10 NOTE — H&P (Signed)
History and Physical    Kylie Thomas [id number] DOB: 04/13/1951 DOA: 10/10/2021  PCP: Maeola Sarah, MD   Patient coming from: home  I have personally briefly reviewed patient's relevant medical records in [place]  Chief Complaint: shortness of breath  HPI: Kylie Thomas is a 70 y.o. 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 fib requiring Eliquis who presents to the ED with a several day history of progressive shortness of breath and lower extremity edema, right greater than left.  She saw her cardiologist on 11/8 noted reaccumulation of pleural fluid problems being managed conservatively with Lasix.  Postoperatively, patient had bleeding complications and Plavix was discontinued and a couple days prior patient started having nosebleeds and her Eliquis was held raising a concern for DVT.  She otherwise denies at this time chest pain, cough, fever or chills.  Denies abdominal pain or diarrhea  ED course: On arrival soft blood pressure of 108/55 otherwise WNL Blood work troponin 23 and BNP 492.  Glucose 232.  Hemoglobin 10 down from baseline of 12-12.5  EKG, personally viewed and interpreted: Paced rhythm of 89 no acute ST-T wave changes  Imaging: Chest x-ray with moderate to large right pleural effusion and moderate left pleural effusion with mild interstitial edema Bilateral lower extremity venous Doppler without evidence of DVT  Patient treated with IV Lasix.  Hospitalist consulted for admission.    Review of Systems: As per HPI otherwise all other systems on review of systems negative.    Past Medical History:  Diagnosis Date   Anxiety    Arrhythmia     Atrial fibrillation (HCC)    CHF (congestive heart failure) ([place])    Coronary artery disease    Depression    Diabetes mellitus without complication (HCC)    GERD (gastroesophageal reflux disease)    Hyperlipidemia    Hypertension     Past Surgical History:  Procedure Laterality Date   BELPHAROPTOSIS REPAIR     CATARACT EXTRACTION, BILATERAL     CESAREAN SECTION     LEFT HEART CATH AND CORONARY ANGIOGRAPHY N/A 05/18/2021   Procedure: LEFT HEART CATH AND CORONARY ANGIOGRAPHY;  Surgeon: Corey Skains, MD;  Location: [facility];  Service: Cardiovascular;  Laterality: N/A;   TRIGGER FINGER RELEASE     x 7     reports that she has never smoked. She has never used smokeless tobacco. She reports that she does not currently use alcohol. She reports that she does not currently use drugs.  Allergies  Allergen Reactions   Atorvastatin Other (See Comments)    Other reaction(s): Arthralgia (Joint Pain) Severe cramps   Remifentanil     Other reaction(s): Irregular Heart Rate   Fentanyl    Sulfa Antibiotics Nausea Only and Other (See Comments)    Family History  Problem Relation Age of Onset   Heart disease Mother    Diabetes Father    Heart attack Father      Prior to Admission medications   Medication Sig Start Date End Date Taking? Authorizing Provider  acetaminophen (TYLENOL) 325 MG tablet Take 2 tablets by mouth every 6 (six) hours as needed.   Yes [provider]  aspirin 81 MG chewable  tablet Chew 1 tablet by mouth daily. 08/11/21 08/11/22 Yes [provider]  bisacodyl (DULCOLAX) 5 MG EC tablet Take 1 tablet by mouth daily as needed. 09/23/21 10/23/21 Yes [provider]  cefadroxil (DURICEF) 500 MG capsule Take 500 mg by mouth 2 (two) times daily.   Yes [provider]  dicyclomine (BENTYL) 10 MG capsule Take 10 mg by mouth 3 (three) times daily. 05/20/21  Yes [provider]  dorzolamide-timolol (COSOPT) 22.3-6.8  MG/ML ophthalmic solution Place 1 drop into both eyes 2 (two) times daily. 07/08/18  Yes [provider]  DULoxetine (CYMBALTA) 60 MG capsule Take 60 mg by mouth 2 (two) times daily. 06/03/18  Yes [provider]  ELIQUIS 5 MG TABS tablet Take 5 mg by mouth 2 (two) times daily. 04/28/21  Yes [provider]  furosemide (LASIX) 20 MG tablet Take 20 mg by mouth daily. 05/12/21 05/12/22 Yes [provider]  gabapentin (NEURONTIN) 300 MG capsule Take 1 capsule by mouth 3 (three) times daily.   Yes [provider]  insulin aspart (NOVOLOG) 100 UNIT/ML injection Inject 4 Units into the skin with breakfast, with lunch, and with evening meal. And sliding scale 04/28/21 10/10/21 Yes [provider]  insulin glargine (LANTUS) 100 UNIT/ML injection Inject 18 Units into the skin daily. 04/28/21 10/10/21 Yes [provider]  levothyroxine (SYNTHROID) 112 MCG tablet Take 112 mcg by mouth daily before breakfast.   Yes [provider]  lidocaine (LIDODERM) 5 % Place 1 patch onto the skin daily. Remove & Discard patch within 12 hours or as directed by MD   Yes [provider]  nitroGLYCERIN (NITROSTAT) 0.4 MG SL tablet Place 1 tablet (0.4 mg total) under the tongue every 5 (five) minutes as needed. 06/01/21  Yes Hackney, Inetta Fermo A, FNP  polyethylene glycol powder (GLYCOLAX/MIRALAX) 17 GM/SCOOP powder Take 1 Container by mouth daily as needed. 08/11/21  Yes [provider]  rosuvastatin (CRESTOR) 40 MG tablet Take 40 mg by mouth every morning. 04/28/21  Yes [provider]  vitamin C (ASCORBIC ACID) 500 MG tablet Take 500 mg by mouth 2 (two) times daily.   Yes [provider]  amiodarone (PACERONE) 200 MG tablet Take 200 mg by mouth daily. Patient not taking: Reported on 10/10/2021 04/28/21   [provider]  apixaban (ELIQUIS) 5 MG TABS tablet Take by mouth. Patient not taking: Reported on 10/10/2021 04/28/21   [provider]  atorvastatin (LIPITOR) 10 MG tablet Take 1 tablet by mouth daily. Patient not taking: Reported on 10/10/2021    [provider]  glucose blood test strip OneTouch Ultra Blue Test Strip    [provider]  hyoscyamine (LEVBID) 0.375 MG 12 hr tablet Take by mouth.    [provider]  isosorbide mononitrate (IMDUR) 30 MG 24 hr tablet Take 2 tablets (60 mg total) by mouth daily. Patient not taking: Reported on 06/07/2021 05/28/21   Willy Eddy, MD  loratadine (CLARITIN) 10 MG tablet Take 10 mg by mouth daily as needed for allergies. Patient not taking: Reported on 10/10/2021    [provider]  losartan (COZAAR) 25 MG tablet Take 12.5 mg by mouth every morning. Patient not taking: No sig reported 04/28/21   [provider]  magnesium oxide (MAG-OX) 400 MG tablet Take 400 mg by mouth daily.    [provider]  melatonin 5 MG TABS Take 1 tablet by mouth at bedtime as needed.    [provider]  metoprolol succinate (TOPROL-XL) 50 MG 24 hr tablet Take 50 mg by mouth 2 (two) times daily. 04/28/21   [provider]  mirtazapine (REMERON) 7.5 MG tablet Take 7.5 mg by mouth at bedtime. 04/28/21   [provider]  pantoprazole (PROTONIX) 20 MG tablet Take by mouth. 04/28/21   [provider]    Physical Exam: Vitals:   10/10/21 1440 10/10/21 1823 10/10/21 1826 10/10/21 1926  BP: (!) 110/54 (!) 108/55 (!) 108/55   Pulse: (!) 101  93   Resp: 18 17 17    Temp: 98 F (36.7 C)     SpO2: 96%  100% 100%   Constitutional: Chronically ill-appearing and oriented x 3 . Not in any apparent distress HEENT:      Head: Normocephalic and atraumatic.         Eyes: PERLA, EOMI, Conjunctivae are normal. Sclera is non-icteric.       Mouth/Throat: Mucous membranes are moist.       Neck: Supple with no signs of meningismus. Cardiovascular: Regular rate and rhythm. No murmurs, gallops, or rubs. 2+ symmetrical distal  pulses are present . No JVD. No  LE edema Respiratory: Respiratory effort normal .Lungs sounds diminished bilaterally. No wheezes, crackles, or rhonchi.  Gastrointestinal: Soft, non tender, non distended. Positive bowel sounds.  Genitourinary: No CVA tenderness. Musculoskeletal: Nontender with normal range of motion in all extremities. No cyanosis, or erythema of extremities. Neurologic:  Face is symmetric. Moving all extremities. No gross focal neurologic deficits . Skin: Skin is warm, dry.  No rash or ulcers Psychiatric: Mood and affect are appropriate    Labs on Admission: I have personally reviewed following labs and imaging studies  CBC: Recent Labs  Lab 10/10/21 1444  WBC 8.7  HGB 10.0*  HCT 33.1*  MCV 94.3  PLT [id number]   Basic Metabolic Panel: Recent Labs  Lab 10/10/21 1444  NA 133*  K 4.4  CL 92*  CO2 32  GLUCOSE 232*  BUN 25*  CREATININE 1.09*  CALCIUM 9.6   GFR: CrCl cannot be calculated (Unknown ideal weight.). Liver Function Tests: No results for input(s): AST, ALT, ALKPHOS, BILITOT, PROT, ALBUMIN in the last 168 hours. No results for input(s): LIPASE, AMYLASE in the last 168 hours. No results for input(s): AMMONIA in the last 168 hours. Coagulation Profile: No results for input(s): INR, PROTIME in the last 168 hours. Cardiac Enzymes: No results for input(s): CKTOTAL, CKMB, CKMBINDEX, TROPONINI in the last 168 hours. BNP (last 3 results) No results for input(s): PROBNP in the last 8760 hours. HbA1C: No results for input(s): HGBA1C in the last 72 hours. CBG: No results for input(s): GLUCAP in the last 168 hours. Lipid Profile: No results for input(s): CHOL, HDL, LDLCALC, TRIG, CHOLHDL, LDLDIRECT in the last 72 hours. Thyroid Function Tests: No results for input(s): TSH, T4TOTAL, FREET4, T3FREE, THYROIDAB in the last 72 hours. Anemia Panel: No results for input(s): VITAMINB12, FOLATE, FERRITIN, TIBC, IRON, RETICCTPCT in the last 72 hours. Urine  analysis:    Component Value Date/Time   COLORURINE YELLOW (A) 05/15/2021 0942   APPEARANCEUR HAZY (A) 05/15/2021 0942   LABSPEC 1.009 05/15/2021 0942   PHURINE 5.0 05/15/2021 0942   GLUCOSEU >=500 (A) 05/15/2021 0942   HGBUR NEGATIVE 05/15/2021 0942   BILIRUBINUR NEGATIVE 05/15/2021 0942   KETONESUR NEGATIVE 05/15/2021 0942   PROTEINUR NEGATIVE 05/15/2021 0942   NITRITE NEGATIVE 05/15/2021 0942   LEUKOCYTESUR MODERATE (A) 05/15/2021 0942    Radiological Exams on Admission: DG Chest 2  View  Result Date: 10/10/2021 CLINICAL DATA:  Shortness of breath for several days EXAM: CHEST - 2 VIEW COMPARISON:  05/28/2021 FINDINGS: There is a left chest wall pacer device with leads in the coronary sinus and right ventricle. Status post left atrial clipping. Previous median sternotomy and CABG procedure. Moderate to large right pleural effusion and moderate left pleural effusion identified with mild interstitial edema. Decreased aeration to both lung bases noted favored to represent atelectasis. IMPRESSION: Imaging findings concerning for congestive heart failure. Electronically Signed   By: Kerby Moors M.D.   On: 10/10/2021 15:27   US Venous Img Lower Bilateral  Result Date: 10/10/2021 CLINICAL DATA:  Increasing lower extremity swelling left greater than right while off anticoagulation therapy. EXAM: BILATERAL LOWER EXTREMITY VENOUS DOPPLER ULTRASOUND TECHNIQUE: Gray-scale sonography with graded compression, as well as color Doppler and duplex ultrasound were performed to evaluate the lower extremity deep venous systems from the level of the common femoral vein and including the common femoral, femoral, profunda femoral, popliteal and calf veins including the posterior tibial, peroneal and gastrocnemius veins when visible. The superficial great saphenous vein was also interrogated. Spectral Doppler was utilized to evaluate flow at rest and with distal augmentation maneuvers in the common femoral,  femoral and popliteal veins. COMPARISON:  None. FINDINGS: RIGHT LOWER EXTREMITY Common Femoral Vein: No evidence of thrombus. Normal compressibility, respiratory phasicity and response to augmentation. Saphenofemoral Junction: No evidence of thrombus. Normal compressibility and flow on color Doppler imaging. Profunda Femoral Vein: No evidence of thrombus. Normal compressibility and flow on color Doppler imaging. Femoral Vein: No evidence of thrombus. Normal compressibility, respiratory phasicity and response to augmentation. Popliteal Vein: No evidence of thrombus. Normal compressibility, respiratory phasicity and response to augmentation. Calf Veins: No evidence of thrombus. Normal compressibility and flow on color Doppler imaging. Superficial Great Saphenous Vein: No evidence of thrombus. Normal compressibility. Venous Reflux:  None. Other Findings:  None. LEFT LOWER EXTREMITY Common Femoral Vein: No evidence of thrombus. Normal compressibility, respiratory phasicity and response to augmentation. Saphenofemoral Junction: No evidence of thrombus. Normal compressibility and flow on color Doppler imaging. Profunda Femoral Vein: No evidence of thrombus. Normal compressibility and flow on color Doppler imaging. Femoral Vein: No evidence of thrombus. Normal compressibility, respiratory phasicity and response to augmentation. Popliteal Vein: No evidence of thrombus. Normal compressibility, respiratory phasicity and response to augmentation. Calf Veins: No evidence of thrombus. Normal compressibility and flow on color Doppler imaging. Superficial Great Saphenous Vein: No evidence of thrombus. Normal compressibility. Venous Reflux:  None. Other Findings:  None. IMPRESSION: No evidence of deep venous thrombosis in either lower extremity. Electronically Signed   By: Inez Catalina M.D.   On: 10/10/2021 19:54    Assessment/Plan    Recurrent right pleural effusion -Pleural effusion complicated post CABG course 8/29-9/14 -  IR consulted for thoracentesis -SCD for DVT prophylaxis overnight and n.p.o. from midnight    Acute on chronic diastolic CHF  -BNP [id number] with chest x-ray showing pulmonary vascular congestion - Echocardiogram done within the past couple months showed EF post MVR replacement of 50% - IV Lasix - Continue metoprolol, Aldactone with hold parameters given soft blood pressure - Daily weights with intake and output monitoring - Cardiology consult    Acute respiratory failure with hypoxia  - Patient with shortness of breath, increased work of breathing with O2 sat in the 80s requiring 2 L O2 to maintain sats in the mid 90s - Secondary to pleural effusion and CHF - Supplemental oxygen to keep  sats over 94   Anemia, possibly acute blood loss - Hemoglobin 10, down from baseline of 12-12 5 with history of recent heavy nosebleed - Continue to hold Eliquis pending cardiology recommendations - Serial H&H  Atrial fibrillation, on chronic anticoagulation S/p atrial appendage ligation - Continue metoprolol if BP tolerates - Apixaban currently on hold secondary to nosebleed at [facility]F - Continue to hold apixaban for now (given atrial appendage ligation ) pending cardiology recommendations    Hyperglycemia due to type 2 DM - Blood sugar 232 - Sliding scale insulin - Basal insulin    S/P CABG (coronary artery bypass graft) -Denies chest pain.  Troponin 23, EKG nonacute - Continue aspirin, beta-blocker, statin    Pacemaker S/p mitral valve replacement - No acute issues suspected - Cardiology consult   HTN (hypertension) - Blood pressure soft so we will hold antihypertensives    CKD (chronic kidney disease), stage IIIa - Renal function at baseline    DVT prophylaxis: SCD for thoracentesis in am Code Status: full code  Family Communication:  none  Disposition Plan: Back to previous home environment Consults called: cardiology  Status:At the time of admission, it appears that the appropriate  admission status for this patient is INPATIENT. This is judged to be reasonable and necessary in order to provide the required intensity of service to ensure the patient's safety given the presenting symptoms, physical exam findings, and initial radiographic and laboratory data in the context of their  Comorbid conditions.   Patient requires inpatient status due to high intensity of service, high risk for further deterioration and high frequency of surveillance required.   I certify that at the point of admission it is my clinical judgment that the patient will require inpatient hospital care spanning beyond [facility] MD [facility]   10/10/2021, 8:57 PM

## 2021-10-10 NOTE — ED Provider Notes (Signed)
[facility] Emergency Department Provider Note ____________________________________________   Event Date/Time   First MD Initiated Contact with Patient 10/10/21 1803     (approximate)  I have reviewed the triage vital signs and the nursing notes.  HISTORY  Chief Complaint Shortness of Breath   HPI Kylie Thomas is a 70 y.o. femalewho presents to the ED for evaluation of shortness of breath.  Chart review indicates CAD s/p CABG, atrial appendage ligation, diastolic CHF, complete heart block.  Dual-chamber pacer is placed.  MVR.  Postop pleural effusions from her CABG requiring thoracentesis.  Eliquis.  Patient presents to the ED, accompanied by [facility] from [facility] due to increased shortness of breath, lower extremity swelling and orthopnea.  She reports about 5 days of worsening shortness of breath, dyspnea on exertion and orthopnea.  She reports asymmetric lower extremity swelling with left greater than right.  She reports pain or discomfort to this left leg and calf.  Denies fevers, chest pain or syncopal episodes.  She reports that she used to be on triple therapy with aspirin, Plavix and Eliquis, but her cardiologist stopped the Plavix due to nosebleeds.  She then had a nosebleed a couple days ago at her facility and the provider on-call recommended she pause all anticoagulation for a couple days, so she has been without anticoagulation, her Eliquis, for 2 to 3 days.    Past Medical History:  Diagnosis Date   Anxiety    Arrhythmia    Atrial fibrillation (HCC)    CHF (congestive heart failure) ([place])    Coronary artery disease    Depression    Diabetes mellitus without complication ([place])    GERD (gastroesophageal reflux disease)    Hyperlipidemia    Hypertension     Patient Active Problem List   Diagnosis Date Noted   Arrhythmia 06/01/2021   CHF exacerbation ([place]) 05/15/2021   Acute on chronic systolic CHF (congestive heart failure)  ([place]) 05/15/2021   Acute respiratory failure with hypoxia ([place]) 05/15/2021   HTN (hypertension) 05/15/2021   HLD (hyperlipidemia) 05/15/2021   Type II diabetes mellitus with renal manifestations ([place]) 05/15/2021   Depression with anxiety 05/15/2021   CKD (chronic kidney disease), stage IIIa 05/15/2021   CAD (coronary artery disease) 05/15/2021   Atrial fibrillation, chronic ([place]) 05/15/2021   Hypothermia 05/15/2021   Sepsis ([place]) 05/15/2021   Elevated troponin 05/15/2021   UTI (urinary tract infection) 05/15/2021   Acquired trigger finger 04/14/2020   Localized, primary osteoarthritis of hand 04/14/2020   Ptosis of both eyelids 07/10/2017   Age-related nuclear cataract of left eye 02/14/2017   Nuclear sclerosis of both eyes 12/27/2016   Nonsenile nuclear cataract of both eyes 08/04/2014   Type II or unspecified type diabetes mellitus with ophthalmic manifestations, uncontrolled(250.52) 11/06/2013   Type II or unspecified type diabetes mellitus without mention of complication, uncontrolled 11/06/2013   GAD (generalized anxiety disorder) 03/07/2013   Plantar fasciitis of left foot 03/07/2013   Symptomatic menopausal or female climacteric states 03/07/2013    Past Surgical History:  Procedure Laterality Date   BELPHAROPTOSIS REPAIR     CATARACT EXTRACTION, BILATERAL     CESAREAN SECTION     LEFT HEART CATH AND CORONARY ANGIOGRAPHY N/A 05/18/2021   Procedure: LEFT HEART CATH AND CORONARY ANGIOGRAPHY;  Surgeon: Corey Skains, MD;  Location: [facility];  Service: Cardiovascular;  Laterality: N/A;   TRIGGER FINGER RELEASE     x 7    Prior to  Admission medications   Medication Sig Start Date End Date Taking? Authorizing Provider  amiodarone (PACERONE) 200 MG tablet Take 200 mg by mouth daily. 04/28/21   [provider]  amiodarone (PACERONE) 200 MG tablet Take 1 tablet by mouth daily. 04/28/21   [provider]  apixaban (ELIQUIS) 5 MG TABS tablet Take by  mouth. 04/28/21   [provider]  atorvastatin (LIPITOR) 10 MG tablet Take 1 tablet by mouth daily.    [provider]  clopidogrel (PLAVIX) 75 MG tablet Take 75 mg by mouth daily. 04/28/21   [provider]  clopidogrel (PLAVIX) 75 MG tablet Take 4 tablets (300 mg) by mouth once this evening (04/28/21) followed by 75 mg daily starting on Friday 04/29/21. 04/28/21   [provider]  dicyclomine (BENTYL) 10 MG capsule Take 10 mg by mouth 3 (three) times daily. 05/20/21   [provider]  dorzolamide-timolol (COSOPT) 22.3-6.8 MG/ML ophthalmic solution Place 1 drop into both eyes 2 (two) times daily. 07/08/18   [provider]  DULoxetine (CYMBALTA) 60 MG capsule Take 60 mg by mouth daily. 06/03/18   [provider]  DULoxetine (CYMBALTA) 60 MG capsule Take 1 capsule by mouth daily.    [provider]  ELIQUIS 5 MG TABS tablet Take 5 mg by mouth 2 (two) times daily. 04/28/21   [provider]  furosemide (LASIX) 20 MG tablet Take 3 tablets (60 mg total) by mouth 2 (two) times daily. 05/18/21   Jennye Boroughs, MD  furosemide (LASIX) 20 MG tablet Take by mouth. 05/12/21 05/12/22  [provider]  gabapentin (NEURONTIN) 300 MG capsule Take 300 mg by mouth daily as needed.    [provider]  gabapentin (NEURONTIN) 300 MG capsule Take 1 capsule by mouth daily as needed.    [provider]  glucose blood test strip OneTouch Ultra Blue Test Strip    [provider]  hyoscyamine (LEVBID) 0.375 MG 12 hr tablet Take by mouth.    [provider]  insulin aspart (NOVOLOG) 100 UNIT/ML injection Inject 4 Units into the skin with breakfast, with lunch, and with evening meal. And sliding scale 04/28/21 06/01/21  [provider]  insulin glargine (LANTUS) 100 UNIT/ML injection Inject 10 Units into the skin daily. 04/28/21 06/01/21  [provider]  isosorbide mononitrate (IMDUR) 30 MG 24 hr tablet Take  2 tablets (60 mg total) by mouth daily. Patient not taking: Reported on 06/07/2021 05/28/21   Merlyn Lot, MD  loratadine (CLARITIN) 10 MG tablet Take 10 mg by mouth daily as needed for allergies.    [provider]  losartan (COZAAR) 25 MG tablet Take 12.5 mg by mouth every morning. Patient not taking: No sig reported 04/28/21   [provider]  magnesium oxide (MAG-OX) 400 MG tablet Take 400 mg by mouth daily.    [provider]  metoprolol succinate (TOPROL-XL) 50 MG 24 hr tablet Take 50 mg by mouth 2 (two) times daily. 04/28/21   [provider]  metoprolol succinate (TOPROL-XL) 50 MG 24 hr tablet Take by mouth. 04/28/21   [provider]  mirtazapine (REMERON) 7.5 MG tablet Take 7.5 mg by mouth at bedtime. 04/28/21   [provider]  mirtazapine (REMERON) 7.5 MG tablet Take 1 tablet by mouth 2 (two) times daily. 04/28/21   [provider]  nitroGLYCERIN (NITROSTAT) 0.4 MG SL tablet Place 1 tablet (0.4 mg total) under the tongue every 5 (five) minutes as needed. 06/01/21  Darylene Price A, FNP  nitroGLYCERIN (NITROSTAT) 0.4 MG SL tablet Place under the tongue. 04/28/21   [provider]  pantoprazole (PROTONIX) 20 MG tablet Take 20 mg by mouth daily.    [provider]  pantoprazole (PROTONIX) 20 MG tablet Take by mouth. 04/28/21   [provider]  rosuvastatin (CRESTOR) 40 MG tablet Take 40 mg by mouth every morning. 04/28/21   [provider]    Allergies Atorvastatin, Remifentanil, Fentanyl, and Sulfa antibiotics  Family History  Problem Relation Age of Onset   Heart disease Mother    Diabetes Father    Heart attack Father     Social History Social History   Tobacco Use   Smoking status: Never   Smokeless tobacco: Never  Vaping Use   Vaping Use: Never used  Substance Use Topics   Alcohol use: Not Currently   Drug use: Not Currently    Review of Systems  Constitutional: No  fever/chills Eyes: No visual changes. ENT: No sore throat. Cardiovascular: Denies chest pain. Respiratory: Positive shortness of breath, dyspnea on exertion and orthopnea. Gastrointestinal: No abdominal pain.  No nausea, no vomiting.  No diarrhea.  No constipation. Genitourinary: Negative for dysuria. Musculoskeletal: Negative for back pain. Skin: Negative for rash. Neurological: Negative for headaches, focal weakness or numbness.  ____________________________________________   PHYSICAL EXAM:  VITAL SIGNS: Vitals:   10/10/21 1826 10/10/21 1926  BP: (!) 108/55   Pulse: 93   Resp: 17   Temp:    SpO2: 100% 100%    Constitutional: Alert and oriented. Well appearing and in no acute distress. Eyes: Conjunctivae are normal. PERRL. EOMI. Head: Atraumatic. Nose: No congestion/rhinnorhea. Mouth/Throat: Mucous membranes are moist.  Oropharynx non-erythematous. Neck: No stridor. No cervical spine tenderness to palpation. Cardiovascular: Normal rate, regular rhythm. Grossly normal heart sounds.  Good peripheral circulation. Respiratory: Mild tachypnea to the low 20s.  No distress.  Decreased breath sounds to the right base.  No wheezing. Gastrointestinal: Soft , nondistended, nontender to palpation. No CVA tenderness. Musculoskeletal:  No joint effusions. No signs of acute trauma. Left greater than right lower extremity pitting edema with tenderness to palpation.  No overlying skin changes. Neurologic:  Normal speech and language. No gross focal neurologic deficits are appreciated.  Skin:  Skin is warm, dry and intact. No rash noted. Psychiatric: Mood and affect are normal. Speech and behavior are normal. ____________________________________________   LABS (all labs ordered are listed, but only abnormal results are displayed)  Labs Reviewed  CBC - Abnormal; Notable for the following components:      Result Value   RBC 3.51 (*)    Hemoglobin 10.0 (*)    HCT 33.1 (*)    RDW 16.0  (*)    All other components within normal limits  BRAIN NATRIURETIC PEPTIDE - Abnormal; Notable for the following components:   B Natriuretic Peptide 492.2 (*)    All other components within normal limits  BASIC METABOLIC PANEL - Abnormal; Notable for the following components:   Sodium 133 (*)    Chloride 92 (*)    Glucose, Bld 232 (*)    BUN 25 (*)    Creatinine, Ser 1.09 (*)    GFR, Estimated 54 (*)    All other components within normal limits  RESP PANEL BY RT-PCR (FLU A&B, COVID) ARPGX2  TROPONIN I (HIGH SENSITIVITY)   ____________________________________________  12 Lead EKG  Narrow complex paced rhythm with a rate of 89 bpm.  Normal axis and appropriate intervals.  No ischemic features. ____________________________________________  RADIOLOGY  ED MD interpretation: 2 view CXR reviewed by me with right greater than left pleural effusions and evidence of pulmonary vascular congestion  Official radiology report(s): DG Chest 2 View  Result Date: 10/10/2021 CLINICAL DATA:  Shortness of breath for several days EXAM: CHEST - 2 VIEW COMPARISON:  05/28/2021 FINDINGS: There is a left chest wall pacer device with leads in the coronary sinus and right ventricle. Status post left atrial clipping. Previous median sternotomy and CABG procedure. Moderate to large right pleural effusion and moderate left pleural effusion identified with mild interstitial edema. Decreased aeration to both lung bases noted favored to represent atelectasis. IMPRESSION: Imaging findings concerning for congestive heart failure. Electronically Signed   By: Signa Kell M.D.   On: 10/10/2021 15:27   US Venous Img Lower Bilateral  Result Date: 10/10/2021 CLINICAL DATA:  Increasing lower extremity swelling left greater than right while off anticoagulation therapy. EXAM: BILATERAL LOWER EXTREMITY VENOUS DOPPLER ULTRASOUND TECHNIQUE: Gray-scale sonography with graded compression, as well as color Doppler and duplex  ultrasound were performed to evaluate the lower extremity deep venous systems from the level of the common femoral vein and including the common femoral, femoral, profunda femoral, popliteal and calf veins including the posterior tibial, peroneal and gastrocnemius veins when visible. The superficial great saphenous vein was also interrogated. Spectral Doppler was utilized to evaluate flow at rest and with distal augmentation maneuvers in the common femoral, femoral and popliteal veins. COMPARISON:  None. FINDINGS: RIGHT LOWER EXTREMITY Common Femoral Vein: No evidence of thrombus. Normal compressibility, respiratory phasicity and response to augmentation. Saphenofemoral Junction: No evidence of thrombus. Normal compressibility and flow on color Doppler imaging. Profunda Femoral Vein: No evidence of thrombus. Normal compressibility and flow on color Doppler imaging. Femoral Vein: No evidence of thrombus. Normal compressibility, respiratory phasicity and response to augmentation. Popliteal Vein: No evidence of thrombus. Normal compressibility, respiratory phasicity and response to augmentation. Calf Veins: No evidence of thrombus. Normal compressibility and flow on color Doppler imaging. Superficial Great Saphenous Vein: No evidence of thrombus. Normal compressibility. Venous Reflux:  None. Other Findings:  None. LEFT LOWER EXTREMITY Common Femoral Vein: No evidence of thrombus. Normal compressibility, respiratory phasicity and response to augmentation. Saphenofemoral Junction: No evidence of thrombus. Normal compressibility and flow on color Doppler imaging. Profunda Femoral Vein: No evidence of thrombus. Normal compressibility and flow on color Doppler imaging. Femoral Vein: No evidence of thrombus. Normal compressibility, respiratory phasicity and response to augmentation. Popliteal Vein: No evidence of thrombus. Normal compressibility, respiratory phasicity and response to augmentation. Calf Veins: No evidence of  thrombus. Normal compressibility and flow on color Doppler imaging. Superficial Great Saphenous Vein: No evidence of thrombus. Normal compressibility. Venous Reflux:  None. Other Findings:  None. IMPRESSION: No evidence of deep venous thrombosis in either lower extremity. Electronically Signed   By: Alcide Clever M.D.   On: 10/10/2021 19:54    ____________________________________________   PROCEDURES and INTERVENTIONS  Procedure(s) performed (including Critical Care):  .1-3 Lead EKG Interpretation Performed by: Delton Prairie, MD Authorized by: Delton Prairie, MD     Interpretation: normal     ECG rate:  90   ECG rate assessment: normal     Rhythm: paced     Ectopy: none     Conduction: normal   .Critical Care Performed by: Delton Prairie, MD Authorized by: Delton Prairie, MD   Critical care provider statement:    Critical care time (minutes):  30   Critical care time  was exclusive of:  Separately billable procedures and treating other patients   Critical care was necessary to treat or prevent imminent or life-threatening deterioration of the following conditions:  Respiratory failure   Critical care was time spent personally by me on the following activities:  Development of treatment plan with patient or surrogate, discussions with consultants, evaluation of patient's response to treatment, examination of patient, ordering and review of laboratory studies, ordering and review of radiographic studies, ordering and performing treatments and interventions, pulse oximetry, re-evaluation of patient's condition and review of old charts  Medications  albuterol (VENTOLIN HFA) 108 (90 Base) MCG/ACT inhaler 2 puff (has no administration in time range)  furosemide (LASIX) injection 40 mg (has no administration in time range)    ____________________________________________   MDM / ED COURSE   70 year old female presents to the ED short of breath with evidence of CHF exacerbation, pleural  effusions and hypoxia requiring medical admission.  She is hypoxic to 88% on room air requiring 2-3 L nasal cannula.  Otherwise hemodynamically stable.  CXR confirms large right-sided pleural effusion.  No evidence of sepsis, CAP or PTX.  Awaiting bilateral venous ultrasound to evaluate for DVT due to her asymmetric swelling in the setting of paused anticoagulation.  Troponins are pending but she has no chest pain and EKG is nonischemic.  We will initiate diuresis here in the ED and discussed the case with medicine for admission for thoracentesis and further diuresis.     ____________________________________________   FINAL CLINICAL IMPRESSION(S) / ED DIAGNOSES  Final diagnoses:  Hypoxia  Shortness of breath  Pleural effusion due to CHF (congestive heart failure) [facility])     ED Discharge Orders     None        Herley Bernardini   Note:  This document was prepared using Dragon voice recognition software and may include unintentional dictation errors.    Vladimir Crofts, MD 10/10/21 2020

## 2021-10-10 NOTE — ED Notes (Signed)
See triage note, pt reports facility has been watching her for a black toe to left foot. Pt reports increased swelling over the last day.  Wears 3 L [place] chronic.  Nad noted Diabetic  Call bell in reach

## 2021-10-10 NOTE — ED Triage Notes (Signed)
Pt comes with c/o SOB for few days. Pt states bilateral leg swelling. Pt on 3L [place] but doesn't wear it at home.   Pt taken off blood thinners recently.  Pt denies any CP.

## 2021-10-11 ENCOUNTER — Inpatient Hospital Stay: Payer: Medicare PPO

## 2021-10-11 DIAGNOSIS — I951 Orthostatic hypotension: Secondary | ICD-10-CM | POA: Diagnosis present

## 2021-10-11 DIAGNOSIS — E1169 Type 2 diabetes mellitus with other specified complication: Secondary | ICD-10-CM

## 2021-10-11 DIAGNOSIS — E785 Hyperlipidemia, unspecified: Secondary | ICD-10-CM

## 2021-10-11 DIAGNOSIS — I5043 Acute on chronic combined systolic (congestive) and diastolic (congestive) heart failure: Secondary | ICD-10-CM

## 2021-10-11 DIAGNOSIS — N182 Chronic kidney disease, stage 2 (mild): Secondary | ICD-10-CM

## 2021-10-11 DIAGNOSIS — J9621 Acute and chronic respiratory failure with hypoxia: Secondary | ICD-10-CM

## 2021-10-11 DIAGNOSIS — D649 Anemia, unspecified: Secondary | ICD-10-CM

## 2021-10-11 DIAGNOSIS — I959 Hypotension, unspecified: Secondary | ICD-10-CM

## 2021-10-11 DIAGNOSIS — I482 Chronic atrial fibrillation, unspecified: Secondary | ICD-10-CM

## 2021-10-11 DIAGNOSIS — L8990 Pressure ulcer of unspecified site, unspecified stage: Secondary | ICD-10-CM

## 2021-10-11 LAB — GLUCOSE, CAPILLARY
Glucose-Capillary: 131 mg/dL — ABNORMAL HIGH (ref 70–99)
Glucose-Capillary: 50 mg/dL — ABNORMAL LOW (ref 70–99)
Glucose-Capillary: 69 mg/dL — ABNORMAL LOW (ref 70–99)
Glucose-Capillary: 142 mg/dL — ABNORMAL HIGH (ref 70–99)

## 2021-10-11 LAB — CBC
HCT: 33.3 % — ABNORMAL LOW (ref 36.0–46.0)
Hemoglobin: 10.3 g/dL — ABNORMAL LOW (ref 12.0–15.0)
MCH: 29.2 pg (ref 26.0–34.0)
MCHC: 30.9 g/dL (ref 30.0–36.0)
MCV: 94.3 fL (ref 80.0–100.0)
Platelets: 355 10*3/uL (ref 150–400)
RBC: 3.53 MIL/uL — ABNORMAL LOW (ref 3.87–5.11)
RDW: 16.2 % — ABNORMAL HIGH (ref 11.5–15.5)
WBC: 11.6 10*3/uL — ABNORMAL HIGH (ref 4.0–10.5)
nRBC: 0 % (ref 0.0–0.2)

## 2021-10-11 LAB — CBG MONITORING, ED
Glucose-Capillary: 370 mg/dL — ABNORMAL HIGH (ref 70–99)
Glucose-Capillary: 279 mg/dL — ABNORMAL HIGH (ref 70–99)

## 2021-10-11 LAB — BASIC METABOLIC PANEL
Anion gap: 9 (ref 5–15)
BUN: 22 mg/dL (ref 8–23)
CO2: 32 mmol/L (ref 22–32)
Calcium: 9.2 mg/dL (ref 8.9–10.3)
Chloride: 91 mmol/L — ABNORMAL LOW (ref 98–111)
Creatinine, Ser: 0.92 mg/dL (ref 0.44–1.00)
GFR, Estimated: >60 mL/min (ref >60)
Glucose, Bld: 322 mg/dL — ABNORMAL HIGH (ref 70–99)
Potassium: 4.1 mmol/L (ref 3.5–5.1)
Sodium: 132 mmol/L — ABNORMAL LOW (ref 135–145)

## 2021-10-11 LAB — BODY FLUID CELL COUNT WITH DIFFERENTIAL
Eos, Fluid: 0 %
Lymphs, Fluid: 41 %
Monocyte-Macrophage-Serous Fluid: 34 %
Neutrophil Count, Fluid: 25 %
Total Nucleated Cell Count, Fluid: 98 cu mm

## 2021-10-11 LAB — GLUCOSE, PLEURAL OR PERITONEAL FLUID: Glucose, Fluid: 291 mg/dL

## 2021-10-11 LAB — PROTEIN, PLEURAL OR PERITONEAL FLUID: Total protein, fluid: 4 g/dL

## 2021-10-11 LAB — LACTATE DEHYDROGENASE, PLEURAL OR PERITONEAL FLUID: LD, Fluid: 88 U/L — ABNORMAL HIGH (ref 3–23)

## 2021-10-11 LAB — TROPONIN I (HIGH SENSITIVITY): Troponin I (High Sensitivity): 29 ng/L — ABNORMAL HIGH (ref <18)

## 2021-10-11 MED ORDER — MUPIROCIN 2 % EX OINT
1.0000 "application " | TOPICAL_OINTMENT | Freq: Two times a day (BID) | CUTANEOUS | Status: DC
  Administered 2021-10-11 – 2021-10-15 (×8): 1 via NASAL
  Filled 2021-10-11: qty 22

## 2021-10-11 MED ORDER — CEFAZOLIN SODIUM-DEXTROSE 1-4 GM/50ML-% IV SOLN
1.0000 g | Freq: Three times a day (TID) | INTRAVENOUS | Status: DC
  Administered 2021-10-11 – 2021-10-12 (×3): 1 g via INTRAVENOUS
  Filled 2021-10-11 (×5): qty 50

## 2021-10-11 MED ORDER — FUROSEMIDE 10 MG/ML IJ SOLN
40.0000 mg | Freq: Two times a day (BID) | INTRAMUSCULAR | Status: DC
  Administered 2021-10-11: 40 mg via INTRAVENOUS
  Filled 2021-10-11: qty 4

## 2021-10-11 MED ORDER — CHLORHEXIDINE GLUCONATE CLOTH 2 % EX PADS: 6.0000 | MEDICATED_PAD | Freq: Every day | CUTANEOUS | Status: DC

## 2021-10-11 MED ORDER — INFLUENZA VAC A&B SA ADJ QUAD 0.5 ML IM PRSY
0.5000 mL | PREFILLED_SYRINGE | INTRAMUSCULAR | Status: AC
  Administered 2021-10-12: 0.5 mL via INTRAMUSCULAR
  Filled 2021-10-11: qty 0.5

## 2021-10-11 NOTE — Progress Notes (Addendum)
Patient ID: Kylie Thomas, female   DOB: September 24, 1951, 70 y.o.   MRN: [id number] [facility] PROGRESS NOTE  Kylie Thomas [id number] DOB: July 23, 1951 DOA: 10/10/2021 PCP: Kylie Sarah, MD  HPI/Subjective: Patient feeling short of breath and weak.  She states that she had a prior thoracentesis.  She does follow with Kylie Thomas podiatry for her left fifth toe.  Admitted with recurrent pleural effusion and congestive heart failure.  Objective: Vitals:   10/11/21 1141 10/11/21 1153  BP: 91/65 (!) 74/43  Pulse:    Resp:    Temp:    SpO2:     No intake or output data in the 24 hours ending 10/11/21 1246 There were no vitals filed for this visit.  ROS: Review of Systems  Respiratory:  Positive for cough and shortness of breath.   Cardiovascular:  Negative for chest pain.  Gastrointestinal:  Positive for nausea. Negative for abdominal pain and vomiting.  Musculoskeletal:  Positive for joint pain.  Exam: Physical Exam HENT:     Head: Normocephalic.     Mouth/Throat:     Pharynx: No oropharyngeal exudate.  Eyes:     General: Lids are normal.     Conjunctiva/sclera: Conjunctivae normal.  Cardiovascular:     Rate and Rhythm: Normal rate and regular rhythm.     Heart sounds: Normal heart sounds, S1 normal and S2 normal.  Pulmonary:     Breath sounds: Normal breath sounds. No decreased breath sounds, wheezing, rhonchi or rales.  Abdominal:     Palpations: Abdomen is soft.     Tenderness: There is no abdominal tenderness.  Musculoskeletal:     Right foot: Swelling present.     Left foot: Swelling present.  Skin:    General: Skin is warm.     Comments: Tip of the left fifth toe blackish.  Neurological:     Mental Status: She is alert and oriented to person, place, and time.       Scheduled Meds:  aspirin  81 mg Oral Daily   dorzolamide-timolol  1 drop Both Eyes BID   DULoxetine  60 mg Oral BID   furosemide  40 mg Intravenous BID   insulin aspart  0-15 Units  Subcutaneous TID WC   insulin aspart  0-5 Units Subcutaneous QHS   insulin glargine-yfgn  15 Units Subcutaneous Daily   levothyroxine  112 mcg Oral Q0600   metoprolol succinate  12.5 mg Oral Daily   pantoprazole  40 mg Oral Daily   rosuvastatin  40 mg Oral q morning   sodium chloride flush  3 mL Intravenous Q12H   Continuous Infusions:  sodium chloride      ceFAZolin (ANCEF) IV     Brief history.  70 year old female with combined systolic and diastolic congestive heart failure, hypertension, diabetes, chronic kidney disease stage IIIa, CAD status post CABG, pacemaker, pleural effusion, paroxysmal atrial fibrillation.  Patient also had some bleeding complications and anticoagulation held.  Patient coming in with shortness of breath and found to have bilateral pleural effusions right greater than left.  1200 mL removed underneath right lung today.  Assessment/Plan:  Bilateral pleural effusions right greater than left.  1200 mL removed underneath left lung.  Laboratory studies sent off but still pending.  Recurrent pleural effusion thoracentesis. Acute on chronic combined systolic and diastolic congestive heart failure.  Lasix 40 mg twice daily IV.  Hold Toprol.  Blood pressure too low for ACE/ARB/Arni/spironolactone at this time. Relative hypotension.  Hold Toprol.  May need  to cut back on Lasix dose. Tip of fifth toe blackened.  We will get podiatry consultation.  Empiric Ancef for now.  Send off sedimentation rate for tomorrow morning. Acute hypoxic respiratory failure.  Pulse ox 79% this morning.  Patient currently on 2 L this afternoon.  Anemia.  Holding Eliquis and Plavix at this time.  Only on aspirin.  Continue to monitor closely Paroxysmal atrial fibrillation.  Anticoagulation on hold for now.  EKG showing paced rhythm. Type 2 diabetes mellitus with hyperlipidemia on Crestor.  Also on sliding scale Chronic kidney disease stage II.  Creatinine 0.92 today       Code Status:      Code Status Orders  (From admission, onward)           Start     Ordered   10/10/21 2136  Full code  Continuous        10/10/21 2141           Code Status History     Date Active Date Inactive Code Status Order ID Comments User Context   05/15/2021 1144 05/19/2021 1712 Full Code [id number]  Kylie Harp, MD ED      Family Communication: Left message for son Disposition Plan: Status is: Inpatient.  Tough balance with CHF recurrent pleural effusions and hypotension.  Need to get balance right prior to any disposition.  Consultants: Cardiology Podiatry  Procedures: Right thoracentesis 1200 mL  Antibiotics: Ancef  Time spent: 27 minutes  Kylie Thomas [employer]

## 2021-10-11 NOTE — Progress Notes (Signed)
Patient was hypoglycemic at 50, protocol initiated with 240 ml juice given=69 fifteen minutes later.  Repeated interventions of juice and graham crackers with peanut butter given with FSBS 144 fifteen minutes later. Pt had no po intake documented from meals today (Novolog and semglee given earlier today).  Floor coverage NP [place] made aware.

## 2021-10-11 NOTE — ED Notes (Signed)
Patient logged for transport to the floor.

## 2021-10-11 NOTE — ED Notes (Signed)
Whitney RN aware of assigned bed 

## 2021-10-11 NOTE — ED Notes (Signed)
40% of lunch ate. No complaints. No signs of distress. Family at  bedside.

## 2021-10-11 NOTE — Evaluation (Signed)
Physical Therapy Evaluation Patient Details Name: Kylie Thomas MRN: [id number] DOB: August 19, 1951 Today's Date: 10/11/2021  History of Present Illness  Darlette Dubow is a 70yoF who comes to [facility] on 11/14 c SOB. PMH: dCHF, HTN, CKD3a, CAD s/p CABG, PPM, MVR s/p valve replacement, AF on eliquis, CRF on 2-3L at home, fifth toe wound. Pt also recently with acute PE September. Pt underwent thoracentesis 11/15 yielding fluid.  Clinical Impression  Pt admitted with above diagnosis. Pt currently with functional limitations due to the deficits listed below (see "PT Problem List"). Patient agreeable to PT evaluation. Patient provides detailed description of PLOF and home environment. Pt coming in directly from [facility], unclear if she has remaining rehab days, reports to have been making progress up until a few days ago when her breathing became worse, today markedly improved now s/p thoracentesis . Pt able to tolerate much of session on RA 89-93%. MinA to get to EOB, MinA to rise to standing, MinA for continued postural support in stance. While up, pt becomes presyncopal, is returned to Supine. Note hypotension thereafter 92/29mmMg. Pt left on 1L/min at 96% SpO2. Patient's assessment this date reveals the patient requires an additional person present for safety and/or physical assistance to complete their typical ADL. At baseline, the patient is able to perform ADL with modified independence. Patient will benefit from skilled PT intervention to maximize independence and safety in mobility required for basic ADL performance at discharge.        Recommendations for follow up therapy are one component of a multi-disciplinary discharge planning process, led by the attending physician.  Recommendations may be updated based on patient status, additional functional criteria and insurance authorization.  Follow Up Recommendations Skilled nursing-short term rehab (<3 hours/day)    Assistance  Recommended at Discharge Intermittent Supervision/Assistance  Functional Status Assessment Patient has had a recent decline in their functional status and demonstrates the ability to make significant improvements in function in a reasonable and predictable amount of time.  Equipment Recommendations  None recommended by PT    Recommendations for Other Services       Precautions / Restrictions Precautions Precautions: Fall      Mobility  Bed Mobility Overal bed mobility: Needs Assistance Bed Mobility: Supine to Sit;Sit to Supine     Supine to sit: Min assist Sit to supine: Min assist        Transfers Overall transfer level: Needs assistance Equipment used: 1 person hand held assist Transfers: Sit to/from Stand Sit to Stand: Min assist           General transfer comment: able to maintain standing x~30sec, never able to DC posterior lean; pt begins to lose alertness and mentation, has concerning weakness in legs, urgent need to return to sitting, mild hypotonic jerking without LOB, noted hypotension 92/44mmHg    Ambulation/Gait Ambulation/Gait assistance:  (unable to attempt due to presyncope)                Stairs            Wheelchair Mobility    Modified Rankin (Stroke Patients Only)       Balance                                             Pertinent Vitals/Pain      Home Living Family/patient expects to be discharged  to:: Private residence Living Arrangements: Alone Available Help at Discharge: Family (DTR in [place], Son in [place], Sister in [place] (visiting now)) Type of Home: House Home Access: Stairs to enter Entrance Stairs-Rails: Lawyer of Steps: 6   Home Layout: 1/2 bath on main level;Two level;Bed/bath upstairs;Able to live on main level with bedroom/bathroom Home Equipment: Agricultural consultant (2 wheels) (3RW)      Prior Function                       Hand Dominance         Extremity/Trunk Assessment                Communication      Cognition                                                General Comments      Exercises     Assessment/Plan    PT Assessment Patient needs continued PT services  PT Problem List Decreased strength;Decreased range of motion;Decreased activity tolerance;Decreased balance;Decreased mobility;Decreased coordination;Decreased knowledge of precautions;Decreased safety awareness;Decreased knowledge of use of DME;Cardiopulmonary status limiting activity       PT Treatment Interventions DME instruction;Balance training;Gait training;Neuromuscular re-education;Stair training;Cognitive remediation;Functional mobility training;Patient/family education;Therapeutic activities;Therapeutic exercise    PT Goals (Current goals can be found in the Care Plan section)  Acute Rehab PT Goals Patient Stated Goal: makes up her recent decline PT Goal Formulation: With patient Time For Goal Achievement: 10/25/21 Potential to Achieve Goals: Good    Frequency Min 2X/week   Barriers to discharge Inaccessible home environment;Decreased caregiver support      Co-evaluation               AM-PAC PT "6 Clicks" Mobility  Outcome Measure Help needed turning from your back to your side while in a flat bed without using bedrails?: A Lot Help needed moving from lying on your back to sitting on the side of a flat bed without using bedrails?: A Lot Help needed moving to and from a bed to a chair (including a wheelchair)?: A Lot Help needed standing up from a chair using your arms (e.g., wheelchair or bedside chair)?: A Lot Help needed to walk in hospital room?: Total Help needed climbing 3-5 steps with a railing? : Total 6 Click Score: 10    End of Session Equipment Utilized During Treatment: Oxygen Activity Tolerance: Patient tolerated treatment well;Treatment limited secondary to medical complications  (Comment) Patient left: in bed;with family/visitor present;with call bell/phone within reach Nurse Communication: Mobility status PT Visit Diagnosis: Unsteadiness on feet (R26.81);Difficulty in walking, not elsewhere classified (R26.2);Other abnormalities of gait and mobility (R26.89);Other symptoms and signs involving the nervous system (R29.898)    Time: 8453-6468 PT Time Calculation (min) (ACUTE ONLY): 35 min   Charges:   PT Evaluation $PT Eval High Complexity: 1 High PT Treatments $Therapeutic Activity: 8-22 mins       5:15 PM, 10/11/21 Rosamaria Lints, PT, DPT Physical Therapist - [facility]  [phone] (ASCOM)    Buena Boehm C 10/11/2021, 5:10 PM

## 2021-10-11 NOTE — Procedures (Signed)
PROCEDURE SUMMARY:  Successful US guided right thoracentesis. Yielded 1200 mL of clear yellow fluid. Pt tolerated procedure well. No immediate complications.  Specimen was sent for labs. CXR ordered.  EBL < 1 mL  Berneta Levins PA-C 10/11/2021 11:55 AM

## 2021-10-11 NOTE — Consult Note (Signed)
   PODIATRY CONSULTATION  NAME RHAPSODY WOLVEN MRN [id number] DOB 09-Apr-1951 DOA 10/10/2021   Reason for consult: LT 5th toe tip is black Chief Complaint  Patient presents with   Shortness of Breath    Consulting physician: Fidela Juneau  History of present illness: 70 y.o. female PMHx diabetes mellitus, CHF, HTN, CKD 3A on anticoagulant Eliquis presenting to the ED with progression of shortness of breath and lower extremity edema.  Upon evaluation she was noted to have discoloration to the distal tip of the toe.  Podiatry consulted.  She presents this afternoon in the emergency department with her sister present at bedside  Past Medical History:  Diagnosis Date   Anxiety    Arrhythmia    Atrial fibrillation (HCC)    CHF (congestive heart failure) (HCC)    Coronary artery disease    Depression    Diabetes mellitus without complication (HCC)    GERD (gastroesophageal reflux disease)    Hyperlipidemia    Hypertension     CBC Latest Ref Rng & Units 10/11/2021 10/10/2021 05/28/2021  WBC 4.0 - 10.5 K/uL 11.6(H) 8.7 9.6  Hemoglobin 12.0 - 15.0 g/dL 10.3(L) 10.0(L) 12.1  Hematocrit 36.0 - 46.0 % 33.3(L) 33.1(L) 37.6  Platelets 150 - 400 K/uL 355 329 312    BMP Latest Ref Rng & Units 10/11/2021 10/10/2021 05/28/2021  Glucose 70 - 99 mg/dL 409(W) 119(J) 478(G)  BUN 8 - 23 mg/dL 22 95(A) 21(H)  Creatinine 0.44 - 1.00 mg/dL 0.86 5.78(I) 6.96(E)  Sodium 135 - 145 mmol/L 132(L) 133(L) 135  Potassium 3.5 - 5.1 mmol/L 4.1 4.4 3.6  Chloride 98 - 111 mmol/L 91(L) 92(L) 95(L)  CO2 22 - 32 mmol/L 32 32 30  Calcium 8.9 - 10.3 mg/dL 9.2 9.6 9.5(M)       Physical Exam: General: The patient is alert and oriented x3 in no acute distress.   Dermatology: There is a very superficial dry stable eschar to the distal tip of the left fifth toe.  Vascular: Faintly palpable pedal pulses left.  No erythema.  There is some moderate edema noted bilateral lower extremities  Neurological: Epicritic  and protective threshold grossly intact bilaterally.   Musculoskeletal Exam: No structural deformity noted.    ASSESSMENT/PLAN OF CARE 1.  Superficial dry eschar left fifth toe -The area of concern to the left fifth toe is very dry and stable and superficial.  A portion of it was debrided today and there is healthy pink underlying skin.  Clinically there is no concern for deeper underlying infection. -This wound should resolve over time on its own with good outpatient care without any surgical or aggressive intervention -Recommend Betadine daily to the toe -Follow-up outpatient in office.  Podiatry will sign off for now.     Thank you for the consult.  Please contact me directly with any questions or concerns.  Cell [phone]   Felecia Shelling, DPM [facility]  Dr. Felecia Shelling, DPM    [address] [place], [place] [address]                Office [phone]  Fax [phone]

## 2021-10-11 NOTE — Consult Note (Signed)
Brief Cardiology Consult Note  Imp CHF SOB Pleural Effusion CAD Hx CABG SSS PPM  PVD Edema DM Toe Ulcer . Plan Agree with Admit IV Lasix  Suppliment 02 ECHO for LVF/Valve evaluation post op Rec thoracentesis Bilat R>L Vascular/Podiatry  consult toe ulcer Short term anticoug DVT prophylasix Compression stockings No evidence of ischemia or valve failure  Full note to follow  Deyci Gesell

## 2021-10-12 ENCOUNTER — Encounter: Payer: Self-pay | Admitting: Internal Medicine

## 2021-10-12 DIAGNOSIS — L8990 Pressure ulcer of unspecified site, unspecified stage: Secondary | ICD-10-CM | POA: Diagnosis present

## 2021-10-12 LAB — CYTOLOGY - NON PAP

## 2021-10-12 LAB — BASIC METABOLIC PANEL
Anion gap: 8 (ref 5–15)
BUN: 26 mg/dL — ABNORMAL HIGH (ref 8–23)
CO2: 32 mmol/L (ref 22–32)
Calcium: 8.5 mg/dL — ABNORMAL LOW (ref 8.9–10.3)
Chloride: 91 mmol/L — ABNORMAL LOW (ref 98–111)
Creatinine, Ser: 0.96 mg/dL (ref 0.44–1.00)
GFR, Estimated: >60 mL/min (ref >60)
Glucose, Bld: 208 mg/dL — ABNORMAL HIGH (ref 70–99)
Potassium: 3.5 mmol/L (ref 3.5–5.1)
Sodium: 131 mmol/L — ABNORMAL LOW (ref 135–145)

## 2021-10-12 LAB — CBC
HCT: 30 % — ABNORMAL LOW (ref 36.0–46.0)
Hemoglobin: 9.1 g/dL — ABNORMAL LOW (ref 12.0–15.0)
MCH: 27.8 pg (ref 26.0–34.0)
MCHC: 30.3 g/dL (ref 30.0–36.0)
MCV: 91.7 fL (ref 80.0–100.0)
Platelets: 323 10*3/uL (ref 150–400)
RBC: 3.27 MIL/uL — ABNORMAL LOW (ref 3.87–5.11)
RDW: 16.1 % — ABNORMAL HIGH (ref 11.5–15.5)
WBC: 7.7 10*3/uL (ref 4.0–10.5)
nRBC: 0 % (ref 0.0–0.2)

## 2021-10-12 LAB — HEPATIC FUNCTION PANEL
ALT: 7 U/L (ref 0–44)
AST: 15 U/L (ref 15–41)
Albumin: 2.8 g/dL — ABNORMAL LOW (ref 3.5–5.0)
Alkaline Phosphatase: 84 U/L (ref 38–126)
Bilirubin, Direct: <0.1 mg/dL (ref 0.0–0.2)
Total Bilirubin: 0.3 mg/dL (ref 0.3–1.2)
Total Protein: 6.7 g/dL (ref 6.5–8.1)

## 2021-10-12 LAB — TRIGLYCERIDES, BODY FLUIDS: Triglycerides, Fluid: 18 mg/dL

## 2021-10-12 LAB — SEDIMENTATION RATE: Sed Rate: 53 mm/hr — ABNORMAL HIGH (ref 0–30)

## 2021-10-12 LAB — MRSA NEXT GEN BY PCR, NASAL: MRSA by PCR Next Gen: DETECTED — AB

## 2021-10-12 LAB — LACTATE DEHYDROGENASE: LDH: 161 U/L (ref 98–192)

## 2021-10-12 LAB — GLUCOSE, CAPILLARY
Glucose-Capillary: 190 mg/dL — ABNORMAL HIGH (ref 70–99)
Glucose-Capillary: 136 mg/dL — ABNORMAL HIGH (ref 70–99)
Glucose-Capillary: 92 mg/dL (ref 70–99)
Glucose-Capillary: 250 mg/dL — ABNORMAL HIGH (ref 70–99)

## 2021-10-12 LAB — HEMOGLOBIN A1C
Hgb A1c MFr Bld: 6.4 % — ABNORMAL HIGH (ref 4.8–5.6)
Mean Plasma Glucose: 137 mg/dL

## 2021-10-12 LAB — ACID FAST SMEAR (AFB, MYCOBACTERIA): Acid Fast Smear: NEGATIVE

## 2021-10-12 MED ORDER — OXYMETAZOLINE HCL 0.05 % NA SOLN
1.0000 | Freq: Two times a day (BID) | NASAL | Status: AC | PRN
  Filled 2021-10-12: qty 15

## 2021-10-12 MED ORDER — FUROSEMIDE 40 MG PO TABS
40.0000 mg | ORAL_TABLET | Freq: Every day | ORAL | Status: DC
  Administered 2021-10-12 – 2021-10-15 (×4): 40 mg via ORAL
  Filled 2021-10-12 (×4): qty 1

## 2021-10-12 MED ORDER — HYDROCORTISONE (PERIANAL) 2.5 % EX CREA
TOPICAL_CREAM | Freq: Two times a day (BID) | CUTANEOUS | Status: DC
  Filled 2021-10-12: qty 28.35

## 2021-10-12 MED ORDER — SALINE SPRAY 0.65 % NA SOLN
1.0000 | NASAL | Status: DC | PRN
  Administered 2021-10-13: 09:00:00 1 via NASAL
  Filled 2021-10-12: qty 44

## 2021-10-12 NOTE — Progress Notes (Signed)
[facility] Progress Note  Patient: Kylie Thomas    RWE:[id number]  DOA: 10/10/2021    Date of Service: the patient was seen and examined on 10/12/2021  Brief hospital course: 70 year old female with combined systolic and diastolic congestive heart failure, hypertension, diabetes, chronic kidney disease stage IIIa, CAD status post CABG, pacemaker, pleural effusion, paroxysmal atrial fibrillation.  Patient also had some bleeding complications and anticoagulation held.  Patient coming in with shortness of breath and found to have bilateral pleural effusions right greater than left.  1200 mL removed underneath right lung  Assessment and Plan: Bilateral pleural effusions right greater than left. 1200 mL removed underneath left lung.  Appears to be transudative. Obvious to be recurrent.  Acute on chronic combined systolic and diastolic congestive heart failure.   Lasix 40 mg twice daily IV.  Switch to p.o. Lasix. Hold Toprol.  Blood pressure too low for ACE/ARB/Arni/spironolactone at this time.  Relative hypotension. Hold Toprol.  May need to cut back on Lasix dose  Tip of fifth toe blackened.  Podiatry recommend outpatient follow-up Stop the antibiotic.  Acute hypoxic respiratory failure present on admission. Improving. Continue incentive spirometry  Anemia. Holding Eliquis and Plavix at this time.  Only on aspirin.  Continue to monitor closely Will resume tomorrow.  Paroxysmal atrial fibrillation.  Anticoagulation on hold for now.  EKG showing paced rhythm.  Type 2 diabetes mellitus with hyperlipidemia on Crestor.  Also on sliding scale  Chronic kidney disease stage II.  Creatinine stable.  Body mass index is 19.85 kg/m.    Pressure Injury 10/11/21 Vertebral column Lower Stage 1 -  Intact skin with non-blanchable redness of a localized area usually over a bony prominence. (Active)  10/11/21 1822  Location: Vertebral column  Location Orientation: Lower  Staging:  Stage 1 -  Intact skin with non-blanchable redness of a localized area usually over a bony prominence.  Wound Description (Comments):   Present on Admission: Yes     Subjective: No nausea no vomiting.  No fever no chills.  Intermittent pain.  No dizziness.  Physical Exam: General: Appear in mild distress, no Rash; Oral Mucosa Clear, moist. no Abnormal Neck Mass Or lumps, Conjunctiva normal  Cardiovascular: S1 and S2 Present, no Murmur, Respiratory: good respiratory effort, Bilateral Air entry present and CTA, no Crackles, no wheezes Abdomen: Bowel Sound present, Soft and no tenderness Extremities: no Pedal edema Neurology: alert and oriented to time, place, and person affect appropriate. no new focal deficit Gait not checked due to patient safety concerns   Data Reviewed: My review of labs, imaging, notes and other tests shows no new significant findings.   Disposition:  Status is: Inpatient  Remains inpatient appropriate because: Monitoring while continuing Lasix.  Will need SNF.  Family Communication: no family was present at bedside, at the time of interview.   DVT Prophylaxis: SCDs Start: 10/10/21 2136   Time spent: 35 minutes. I reviewed all nursing notes, pharmacy notes, vitals, pertinent old records. I have discussed plan of care as described above with RN.  Author: Lynden Oxford  10/12/2021 8:50 PM  To reach On-call, see care teams to locate the attending and reach out via [email or web address]. Between 7PM-7AM, please contact night-coverage If you still have difficulty reaching the attending provider, please page the [facility] (Director on Call) for [facility] on amion for assistance.

## 2021-10-12 NOTE — Progress Notes (Signed)
   10/12/21 1805  Vitals  ECG Heart Rate 94  Resp 14  MEWS COLOR  MEWS Score Color Green  Oxygen Therapy  SpO2 93 %  O2 Device Nasal Cannula  O2 Flow Rate (L/min) 1 L/min  MEWS Score  MEWS Temp 0  MEWS Systolic 0  MEWS Pulse 0  MEWS RR 0  MEWS LOC 0  MEWS Score 0   Weaned patient to room air, but patient stated that she feel that she was not getting any air.  Patient appeared anxious and this RN told her that she was previously on room air today from 1400-1800 with no signs of desaturation.  Patient still wanted oxygen for comfort. Placed on 1L [place] at this time.

## 2021-10-12 NOTE — TOC Initial Note (Signed)
Transition of Care [facility](TOC) - Initial/Assessment Note    Patient Details  Name: Kylie Thomas MRN: [id number] Date of Birth: 1951-03-24  Transition of Care [facility](TOC) CM/SW Contact:    Gildardo GriffesAshley M Kalliopi Coupland, LCSW Phone Number: 10/12/2021, 12:49 PM  Clinical Narrative:                  CSW spoke with patient's daughter Marchelle Folksmanda who reports plan is for patient to return to [facility] for short term rehab at time of discharge.   CSW has initiated insurance auth at this time.   Expected Discharge Plan: Skilled Nursing Facility Barriers to Discharge: Continued Medical Work up   Patient Goals and CMS Choice Patient states their goals for this hospitalization and ongoing recovery are:: to go home CMS Medicare.gov Compare Post Acute Care list provided to:: Patient Represenative (must comment) (daughter Marchelle Folksmanda) Choice offered to / list presented to : Adult Children  Expected Discharge Plan and Services Expected Discharge Plan: Skilled Nursing Facility       Living arrangements for the past 2 months: Skilled Nursing Facility                                      Prior Living Arrangements/Services Living arrangements for the past 2 months: Skilled Nursing Facility Lives with:: Self Patient language and need for interpreter reviewed:: Yes        Need for Family Participation in Patient Care: Yes (Comment) Care giver support system in place?: Yes (comment)   Criminal Activity/Legal Involvement Pertinent to Current Situation/Hospitalization: No - Comment as needed  Activities of Daily Living Home Assistive Devices/Equipment: Walker (specify type) ADL Screening (condition at time of admission) Patient's cognitive ability adequate to safely complete daily activities?: Yes Is the patient deaf or have difficulty hearing?: No Does the patient have difficulty seeing, even when wearing glasses/contacts?: No Does the patient have difficulty concentrating, remembering, or making decisions?: No Patient  able to express need for assistance with ADLs?: Yes Does the patient have difficulty dressing or bathing?: Yes Independently performs ADLs?: No Communication: Independent Grooming: Independent Feeding: Independent Bathing: Needs assistance Toileting: Needs assistance In/Out Bed: Needs assistance Walks in Home: Needs assistance Does the patient have difficulty walking or climbing stairs?: Yes Weakness of Legs: Both Weakness of Arms/Hands: None  Permission Sought/Granted      Share Information with NAME: Marchelle Folksmanda  Permission granted to share info w AGENCY: SNFs  Permission granted to share info w Relationship: daughter  Permission granted to share info w Contact Information: [phone]  Emotional Assessment Appearance:: Appears stated age       Alcohol / Substance Use: Not Applicable Psych Involvement: No (comment)  Admission diagnosis:  Shortness of breath [R06.02] Pleural effusion [J90] Hypoxia [R09.02] S/P thoracentesis [Z98.890] Pleural effusion due to CHF (congestive heart failure) (HCC) [I50.9] Pleural effusion on right [J90] Recurrent right pleural effusion [J90] Patient Active Problem List   Diagnosis Date Noted   Pressure injury of skin 10/12/2021   Hypotension    Stage 2 chronic kidney disease    Recurrent right pleural effusion 10/10/2021   Hyperglycemia due to type 2 diabetes mellitus (HCC) 10/10/2021   S/P CABG (coronary artery bypass graft) 10/10/2021   Chronic anticoagulation 10/10/2021   Pacemaker 10/10/2021   S/P mitral valve replacement 10/10/2021   Anemia 10/10/2021   Arrhythmia 06/01/2021   CHF exacerbation (HCC) 05/15/2021   Acute on chronic combined systolic and diastolic  CHF (congestive heart failure) (HCC) 05/15/2021   Acute on chronic respiratory failure with hypoxia (HCC) 05/15/2021   HTN (hypertension) 05/15/2021   HLD (hyperlipidemia) 05/15/2021   Type 2 diabetes mellitus with hyperlipidemia (HCC) 05/15/2021   Depression with anxiety  05/15/2021   CKD (chronic kidney disease), stage IIIa 05/15/2021   CAD (coronary artery disease) 05/15/2021   Atrial fibrillation, chronic (HCC) 05/15/2021   Hypothermia 05/15/2021   Sepsis (HCC) 05/15/2021   Elevated troponin 05/15/2021   UTI (urinary tract infection) 05/15/2021   Acquired trigger finger 04/14/2020   Localized, primary osteoarthritis of hand 04/14/2020   Ptosis of both eyelids 07/10/2017   Age-related nuclear cataract of left eye 02/14/2017   Nuclear sclerosis of both eyes 12/27/2016   Nonsenile nuclear cataract of both eyes 08/04/2014   Type II or unspecified type diabetes mellitus with ophthalmic manifestations, uncontrolled(250.52) 11/06/2013   Type II or unspecified type diabetes mellitus without mention of complication, uncontrolled 11/06/2013   GAD (generalized anxiety disorder) 03/07/2013   Plantar fasciitis of left foot 03/07/2013   Symptomatic menopausal or female climacteric states 03/07/2013   PCP:  Estell Harpin, MD Pharmacy:   [facility] [address], [place] - [address] [place] [address] Phone: [phone] Fax: [phone]     Social Determinants of Health (SDOH) Interventions    Readmission Risk Interventions No flowsheet data found.

## 2021-10-12 NOTE — Progress Notes (Signed)
CSW notes no family at bedside this morning, lvm with patient's son and daughter to discuss discharge planning and inquire if plan is to return to [facility].   CSW spoke with Inetta Fermo at [facility] who reports insurance would need to be renewed and then patient would be covered financially for up to 20 days.   [place], [place] [phone]

## 2021-10-12 NOTE — NC FL2 (Signed)
[place] LEVEL OF CARE SCREENING TOOL     IDENTIFICATION  Patient Name: Kylie Thomas Birthdate: 05/07/1951 Sex: female Admission Date (Current Location): 10/10/2021  [facility] and [place] Number:  Engineering geologist and Address:  [facility], [address] [place], [place] [address]      Provider Number: [id number]  Attending Physician Name and Address:  Lavina Hamman, MD  Relative Name and Phone Number:  Estill Bamberg (daughter) [phone]    Current Level of Care: Hospital Recommended Level of Care: [place] Prior Approval Number:    Date Approved/Denied:   PASRR Number: [id number]  Discharge Plan: SNF    Current Diagnoses: Patient Active Problem List   Diagnosis Date Noted   Pressure injury of skin 10/12/2021   Hypotension    Stage 2 chronic kidney disease    Recurrent right pleural effusion 10/10/2021   Hyperglycemia due to type 2 diabetes mellitus ([place]) 10/10/2021   S/P CABG (coronary artery bypass graft) 10/10/2021   Chronic anticoagulation 10/10/2021   Pacemaker 10/10/2021   S/P mitral valve replacement 10/10/2021   Anemia 10/10/2021   Arrhythmia 06/01/2021   CHF exacerbation ([place]) 05/15/2021   Acute on chronic combined systolic and diastolic CHF (congestive heart failure) ([place]) 05/15/2021   Acute on chronic respiratory failure with hypoxia (HCC) 05/15/2021   HTN (hypertension) 05/15/2021   HLD (hyperlipidemia) 05/15/2021   Type 2 diabetes mellitus with hyperlipidemia ([place]) 05/15/2021   Depression with anxiety 05/15/2021   CKD (chronic kidney disease), stage IIIa 05/15/2021   CAD (coronary artery disease) 05/15/2021   Atrial fibrillation, chronic ([place]) 05/15/2021   Hypothermia 05/15/2021   Sepsis ([place]) 05/15/2021   Elevated troponin 05/15/2021   UTI (urinary tract infection) 05/15/2021   Acquired trigger finger 04/14/2020   Localized, primary osteoarthritis of hand 04/14/2020   Ptosis of both  eyelids 07/10/2017   Age-related nuclear cataract of left eye 02/14/2017   Nuclear sclerosis of both eyes 12/27/2016   Nonsenile nuclear cataract of both eyes 08/04/2014   Type II or unspecified type diabetes mellitus with ophthalmic manifestations, uncontrolled(250.52) 11/06/2013   Type II or unspecified type diabetes mellitus without mention of complication, uncontrolled 11/06/2013   GAD (generalized anxiety disorder) 03/07/2013   Plantar fasciitis of left foot 03/07/2013   Symptomatic menopausal or female climacteric states 03/07/2013    Orientation RESPIRATION BLADDER Height & Weight     Place  O2 (2L nasal cannula) Incontinent, External catheter Weight: 101 lb 10.1 oz (46.1 kg) Height:  5' (152.4 cm)  BEHAVIORAL SYMPTOMS/MOOD NEUROLOGICAL BOWEL NUTRITION STATUS      Incontinent Diet (see discharge summary)  AMBULATORY STATUS COMMUNICATION OF NEEDS Skin   Extensive Assist Verbally Other (Comment) (abrasion abdomen)                       Personal Care Assistance Level of Assistance  Bathing, Feeding, Dressing, Total care Bathing Assistance: Limited assistance Feeding assistance: Independent Dressing Assistance: Limited assistance Total Care Assistance: Maximum assistance   Functional Limitations Info  Sight, Hearing, Speech Sight Info: Adequate Hearing Info: Adequate Speech Info: Adequate    SPECIAL CARE FACTORS FREQUENCY  PT (By licensed PT), OT (By licensed OT)     PT Frequency: min 4x weekly OT Frequency: min 4x weekly            Contractures Contractures Info: Not present    Additional Factors Info  Code Status, Allergies Code Status Info: full Allergies Info: atorvastatin, remifentanil,  fentanyl, sulfa antibiotics           Current Medications (10/12/2021):  This is the current hospital active medication list Current Facility-Administered Medications  Medication Dose Route Frequency Provider Last Rate Last Admin   0.9 %  sodium chloride  infusion  250 mL Intravenous PRN Andris Baumann, MD   Stopped at 10/12/21 [id number]   acetaminophen (TYLENOL) tablet 650 mg  650 mg Oral Q4H PRN Andris Baumann, MD       albuterol (VENTOLIN HFA) 108 (90 Base) MCG/ACT inhaler 2 puff  2 puff Inhalation Q2H PRN Delton Prairie, MD       aspirin chewable tablet 81 mg  81 mg Oral Daily Lindajo Royal V, MD   81 mg at 10/12/21 2119   bisacodyl (DULCOLAX) EC tablet 5 mg  5 mg Oral Daily PRN Andris Baumann, MD       dorzolamide-timolol (COSOPT) 22.3-6.8 MG/ML ophthalmic solution 1 drop  1 drop Both Eyes BID Lindajo Royal V, MD   1 drop at 10/12/21 0930   DULoxetine (CYMBALTA) DR capsule 60 mg  60 mg Oral BID Andris Baumann, MD   60 mg at 10/12/21 4174   furosemide (LASIX) tablet 40 mg  40 mg Oral Daily Rolly Salter, MD       insulin aspart (novoLOG) injection 0-15 Units  0-15 Units Subcutaneous TID WC Andris Baumann, MD   2 Units at 10/12/21 1248   insulin aspart (novoLOG) injection 0-5 Units  0-5 Units Subcutaneous QHS Andris Baumann, MD   2 Units at 10/10/21 2312   levothyroxine (SYNTHROID) tablet 112 mcg  112 mcg Oral Q0600 Andris Baumann, MD   112 mcg at 10/12/21 0641   melatonin tablet 5 mg  5 mg Oral QHS PRN Andris Baumann, MD       mupirocin ointment (BACTROBAN) 2 % 1 application  1 application Nasal BID Andris Baumann, MD   1 application at 10/12/21 0929   ondansetron (ZOFRAN) injection 4 mg  4 mg Intravenous Q6H PRN Andris Baumann, MD   4 mg at 10/11/21 0457   oxymetazoline (AFRIN) 0.05 % nasal spray 1 spray  1 spray Each Nare BID PRN Rolly Salter, MD       pantoprazole (PROTONIX) EC tablet 40 mg  40 mg Oral Daily Lindajo Royal V, MD   40 mg at 10/12/21 0929   polyethylene glycol (MIRALAX / GLYCOLAX) packet 17 g  17 g Oral Daily PRN Andris Baumann, MD       rosuvastatin (CRESTOR) tablet 40 mg  40 mg Oral q morning Lindajo Royal V, MD   40 mg at 10/12/21 0942   sodium chloride (OCEAN) 0.65 % nasal spray 1 spray  1 spray Each Nare PRN Rolly Salter, MD       sodium chloride flush (NS) 0.9 % injection 3 mL  3 mL Intravenous Q12H Lindajo Royal V, MD   3 mL at 10/12/21 0935   sodium chloride flush (NS) 0.9 % injection 3 mL  3 mL Intravenous PRN Andris Baumann, MD   3 mL at 10/10/21 2312     Discharge Medications: Please see discharge summary for a list of discharge medications.  Relevant Imaging Results:  Relevant Lab Results:   Additional Information SSN: [id number]  Gildardo Griffes, LCSW

## 2021-10-12 NOTE — Consult Note (Signed)
   Heart Failure Nurse Navigator Note  HFrEF 40 to 45%.  Mild LVH.  Grade 2 diastolic dysfunction.  Moderately elevated pulmonary hypertension.  Severe mitral regurgitation.  She presented to the emergency room with complaints of shortness of breath and lower extremity edema.  Chest x-ray revealed large right pleural effusion and moderate left pleural effusion with interstitial edema.  BNP was 492.  Comorbidities:  Hypertension Diabetes Chronic kidney disease Coronary artery disease status post CABG Permanent pacemaker for complete heart block  Medications:  ASA 81 mg daily Furosemide 40 mg p.o. daily Crestor 40 mg every morning  Labs:  Sodium 131,potassium 3.5, chloride 91, BUN 26, creatinine 0.96, albumin 2.8, AST 15 ALT 7, alkaline phosphatase 84 direct bilirubin 0.3 Weight is 46.1 kg Intake 728 mL Output not documented  On 11/15 she underwent thoracentesis on the right which revealed 1200 mL of pleural fluid.  Initial meeting with patient today, she was lying quietly in bed in no acute distress.  He states that prior to admission she was residing at [facility].  Discussed how she takes care of her self at home, he states at 1 time she had been weighing herself daily but got out of that routine.  Discussed the importance of weighing daily and what to report to physician.  Also discussed her diet she states that she gets frozen meals from [facility] that are low sodium but in addition to that she does admit to eating canned tomato soup maybe every 2 to 3 days and also likes [employer].  She continues to use salt at the table.  Explained the reasoning behind removing the saltshaker from the table and eating foods lower in sodium.   Discussed her fluid intake she does not feel that she goes over 64 ounces in a 24-hour period.  Also talked with her about following up in the outpatient heart failure clinic.  She was given an appointment for November 30 at 1:30 in the  afternoon, along with information about the heart failure clinic.  Is a no-show percentage of 13%, 3 out of 24 appointments.   She was also given the living with heart failure teaching booklet along with information on low-sodium and the zone magnet.  Tresa Endo RN CHFN

## 2021-10-13 LAB — BASIC METABOLIC PANEL
Anion gap: 13 (ref 5–15)
BUN: 26 mg/dL — ABNORMAL HIGH (ref 8–23)
CO2: 31 mmol/L (ref 22–32)
Calcium: 8.7 mg/dL — ABNORMAL LOW (ref 8.9–10.3)
Chloride: 89 mmol/L — ABNORMAL LOW (ref 98–111)
Creatinine, Ser: 0.8 mg/dL (ref 0.44–1.00)
GFR, Estimated: >60 mL/min (ref >60)
Glucose, Bld: 282 mg/dL — ABNORMAL HIGH (ref 70–99)
Potassium: 3.6 mmol/L (ref 3.5–5.1)
Sodium: 133 mmol/L — ABNORMAL LOW (ref 135–145)

## 2021-10-13 LAB — CBC WITH DIFFERENTIAL/PLATELET
Abs Immature Granulocytes: 0.05 10*3/uL (ref 0.00–0.07)
Basophils Absolute: 0.1 10*3/uL (ref 0.0–0.1)
Basophils Relative: 1 %
Eosinophils Absolute: 0.1 10*3/uL (ref 0.0–0.5)
Eosinophils Relative: 1 %
HCT: 34.1 % — ABNORMAL LOW (ref 36.0–46.0)
Hemoglobin: 10.2 g/dL — ABNORMAL LOW (ref 12.0–15.0)
Immature Granulocytes: 1 %
Lymphocytes Relative: 11 %
Lymphs Abs: 0.9 10*3/uL (ref 0.7–4.0)
MCH: 28.6 pg (ref 26.0–34.0)
MCHC: 29.9 g/dL — ABNORMAL LOW (ref 30.0–36.0)
MCV: 95.5 fL (ref 80.0–100.0)
Monocytes Absolute: 0.9 10*3/uL (ref 0.1–1.0)
Monocytes Relative: 11 %
Neutro Abs: 6.2 10*3/uL (ref 1.7–7.7)
Neutrophils Relative %: 75 %
Platelets: 320 10*3/uL (ref 150–400)
RBC: 3.57 MIL/uL — ABNORMAL LOW (ref 3.87–5.11)
RDW: 15.9 % — ABNORMAL HIGH (ref 11.5–15.5)
WBC: 8.2 10*3/uL (ref 4.0–10.5)
nRBC: 0 % (ref 0.0–0.2)

## 2021-10-13 LAB — COMP PANEL: LEUKEMIA/LYMPHOMA

## 2021-10-13 LAB — GLUCOSE, CAPILLARY
Glucose-Capillary: 289 mg/dL — ABNORMAL HIGH (ref 70–99)
Glucose-Capillary: 288 mg/dL — ABNORMAL HIGH (ref 70–99)
Glucose-Capillary: 124 mg/dL — ABNORMAL HIGH (ref 70–99)
Glucose-Capillary: 156 mg/dL — ABNORMAL HIGH (ref 70–99)

## 2021-10-13 MED ORDER — ATORVASTATIN CALCIUM 10 MG PO TABS: 10.0000 mg | ORAL_TABLET | Freq: Every day | ORAL | Status: DC

## 2021-10-13 MED ORDER — INSULIN GLARGINE-YFGN 100 UNIT/ML ~~LOC~~ SOLN
12.0000 [IU] | Freq: Every day | SUBCUTANEOUS | Status: DC
  Administered 2021-10-13 – 2021-10-15 (×3): 12 [IU] via SUBCUTANEOUS
  Filled 2021-10-13 (×4): qty 0.12

## 2021-10-13 MED ORDER — TRAMADOL HCL 50 MG PO TABS: 100.0000 mg | ORAL_TABLET | Freq: Three times a day (TID) | ORAL | Status: DC | PRN

## 2021-10-13 MED ORDER — DICYCLOMINE HCL 10 MG PO CAPS
10.0000 mg | ORAL_CAPSULE | Freq: Three times a day (TID) | ORAL | Status: DC
  Administered 2021-10-13 – 2021-10-14 (×5): 10 mg via ORAL
  Filled 2021-10-13 (×10): qty 1

## 2021-10-13 MED ORDER — POVIDONE-IODINE 10 % EX SOLN
CUTANEOUS | Status: DC | PRN
  Filled 2021-10-13: qty 473

## 2021-10-13 MED ORDER — APIXABAN 5 MG PO TABS
5.0000 mg | ORAL_TABLET | Freq: Two times a day (BID) | ORAL | Status: DC
  Administered 2021-10-13 – 2021-10-15 (×6): 5 mg via ORAL
  Filled 2021-10-13 (×8): qty 1

## 2021-10-13 MED ORDER — GABAPENTIN 300 MG PO CAPS
300.0000 mg | ORAL_CAPSULE | Freq: Three times a day (TID) | ORAL | Status: DC
  Administered 2021-10-13 – 2021-10-15 (×7): 300 mg via ORAL
  Filled 2021-10-13 (×7): qty 1

## 2021-10-13 MED ORDER — MIDODRINE HCL 5 MG PO TABS
2.5000 mg | ORAL_TABLET | Freq: Two times a day (BID) | ORAL | Status: DC
  Administered 2021-10-13 – 2021-10-15 (×5): 2.5 mg via ORAL
  Filled 2021-10-13 (×2): qty 0.5
  Filled 2021-10-13 (×5): qty 1

## 2021-10-13 MED ORDER — ONDANSETRON HCL 4 MG PO TABS
4.0000 mg | ORAL_TABLET | Freq: Four times a day (QID) | ORAL | Status: DC | PRN
  Administered 2021-10-14: 4 mg via ORAL
  Filled 2021-10-13: qty 1

## 2021-10-13 NOTE — Progress Notes (Signed)
[facility] Progress Note  Patient: Kylie Thomas    JYN:[id number]  DOA: 10/10/2021    Date of Service: the patient was seen and examined on 10/13/2021  Brief hospital course: 70 year old female with combined systolic and diastolic congestive heart failure, hypertension, diabetes, chronic kidney disease stage IIIa, CAD status post CABG, pacemaker, pleural effusion, paroxysmal atrial fibrillation.  Patient also had some bleeding complications and anticoagulation held.  Patient coming in with shortness of breath and found to have bilateral pleural effusions right greater than left.  1200 mL removed underneath right lung  Assessment and Plan: Bilateral pleural effusions right greater than left. 1200 mL removed underneath left lung.  Appears to be transudative. Appears to be recurrent.  Acute on chronic combined systolic and diastolic congestive heart failure.   Lasix 40 mg twice daily IV.  Switch to p.o. Lasix. Hold Toprol.  Blood pressure too low for ACE/ARB/Arni/spironolactone at this time.  Orthostatic hypotension. Hold Toprol. Blood pressure dropped significantly from 100-80 systolic with symptoms. At present I will add midodrine and abdominal binder. Not a candidate for volume expansion strategy.  Tip of fifth toe blackened.  Podiatry recommend outpatient follow-up Stop the antibiotic. Continue Betadine dressing changes.  Will resume and monitor.  Acute hypoxic respiratory failure present on admission. Improving. Continue incentive spirometry  Anemia. Holding Eliquis and Plavix at this time.  Only on aspirin.  Continue to monitor closely Will resume tomorrow.  Paroxysmal atrial fibrillation. Resume Eliquis.  Type 2 diabetes mellitus with hyperlipidemia on Crestor.  Also on sliding scale Resuming Lantus.  Chronic kidney disease stage II.  Creatinine stable.  Stage I pressure ulcer medial lower back. Present on admission. Continue foam dressing.  Body mass  index is 20.23 kg/m.    Pressure Injury 10/11/21 Vertebral column Lower Stage 1 -  Intact skin with non-blanchable redness of a localized area usually over a bony prominence. (Active)  10/11/21 1822  Location: Vertebral column  Location Orientation: Lower  Staging: Stage 1 -  Intact skin with non-blanchable redness of a localized area usually over a bony prominence.  Wound Description (Comments):   Present on Admission: Yes     Subjective: Reports intermittent nausea.  Although able to eat.  No vomiting.  No diarrhea.  Passing gas.  Had multiple bowel movements on 15th.  Physical Exam: General: Appear in mild distress, no Rash; Oral Mucosa Clear, moist. no Abnormal Neck Mass Or lumps, Conjunctiva normal  Cardiovascular: S1 and S2 Present, no Murmur, Respiratory: good respiratory effort, Bilateral Air entry present and CTA, no Crackles, no wheezes Abdomen: Bowel Sound present, Soft and no tenderness Extremities: no Pedal edema Neurology: alert and oriented to time, place, and person affect appropriate. no new focal deficit Gait not checked due to patient safety concerns    Data Reviewed: My review of labs, imaging, notes and other tests shows no new significant findings.   Disposition:  Status is: Inpatient  Remains inpatient appropriate because: Monitoring while continuing Lasix.  Will need SNF.  Family Communication: no family was present at bedside, at the time of interview.   DVT Prophylaxis: SCDs Start: 10/10/21 2136 apixaban (ELIQUIS) tablet 5 mg   Time spent: 35 minutes. I reviewed all nursing notes, pharmacy notes, vitals, pertinent old records. I have discussed plan of care as described above with RN.  Author: Lynden Oxford  10/13/2021 9:04 PM  To reach On-call, see care teams to locate the attending and reach out via [email or web address]. Between 7PM-7AM, please contact night-coverage If you  still have difficulty reaching the attending provider, please page the [facility]  (Director on Call) for [facility] on amion for assistance.

## 2021-10-13 NOTE — Care Management Important Message (Signed)
Important Message  Patient Details  Name: Kylie Thomas MRN: [id number] Date of Birth: 1951-06-21   Medicare Important Message Given:  Yes     Johnell Comings 10/13/2021, 11:31 AM

## 2021-10-13 NOTE — Progress Notes (Signed)
Inpatient Diabetes Program Recommendations  AACE/ADA: New Consensus Statement on Inpatient Glycemic Control (2015)  Target Ranges:  Prepandial:   less than 140 mg/dL      Peak postprandial:   less than 180 mg/dL (1-2 hours)      Critically ill patients:  140 - 180 mg/dL    Latest Reference Range & Units 10/12/21 07:44 10/12/21 11:52 10/12/21 16:15 10/12/21 20:35  Glucose-Capillary 70 - 99 mg/dL 100 (H)  3 units Novolog  136 (H)  2 units Novolog  92 250 (H)  3 units Novolog   (H): Data is abnormally high  Latest Reference Range & Units 10/13/21 08:18  Glucose-Capillary 70 - 99 mg/dL 712 (H)  8 units Novolog   (H): Data is abnormally high   Home DM Meds: Trulicity 1.5 mg Qweek        Novolog 4 units TID with meals        Lantus 18 units daily  Current Orders: Novolog Moderate Correction Scale/ SSI (0-15 units) TID AC + HS     MD- Note pt had Hypoglycemia on PM of 11/15.  Semglee was stopped.  CBG 289 this AM.  Please consider adding back Semglee 12 units Daily (2/3 total home dose)    --Will follow patient during hospitalization--  Ambrose Finland RN, MSN, CDE Diabetes Coordinator Inpatient Glycemic Control Team Team Pager: [phone] (8a-5p)

## 2021-10-13 NOTE — Plan of Care (Signed)
  Problem: Nutrition: Goal: Adequate nutrition will be maintained Outcome: Progressing   Problem: Elimination: Goal: Will not experience complications related to urinary retention Outcome: Progressing   

## 2021-10-13 NOTE — TOC Progression Note (Addendum)
Transition of Care [facility]) - Progression Note    Patient Details  Name: MEKO BELLANGER MRN: [id number] Date of Birth: 1950-12-23  [facility] CM/SW Contact  Gildardo Griffes, [place] Phone Number: 10/13/2021, 3:38 PM  Clinical Narrative:     Update: Peer to peer denied, CSW spoke with patient's daughter Marchelle Folks and provided her the phone number to appeal the denial for SNF coverage [phone]). She reports she will call and appeal the denial for SNF, she reports they do have a back up plan of respite with [facility] if they deny.    Insurance went to peer to peer, done today by MD and CSW faxed over updated PT notes at 2pm, pending final decision at this time.   Expected Discharge Plan: Skilled Nursing Facility Barriers to Discharge: Continued Medical Work up  Expected Discharge Plan and Services Expected Discharge Plan: Skilled Nursing Facility       Living arrangements for the past 2 months: Skilled Nursing Facility                                       Social Determinants of Health (SDOH) Interventions    Readmission Risk Interventions No flowsheet data found.

## 2021-10-13 NOTE — Progress Notes (Signed)
Physical Therapy Treatment Patient Details Name: SRUTHI MAURER MRN: [id number] DOB: July 08, 1951 Today's Date: 10/13/2021   History of Present Illness Shavana Calder is a 70yoF who comes [facility] on 11/14 c SOB. PMH: dCHF, HTN, CKD3a, CAD s/p CABG, PPM, MVR s/p valve replacement, AF on eliquis, CRF on 2-3L at home, fifth toe wound. Pt also recently with acute PE September. Pt underwent thoracentesis 11/15 yielding fluid.    PT Comments    Pt in recliner at entry, agreeable to session. Pt has no recollection of author nor of PT evaluation from 2 days prior. Orthostatic vitals demonstrative of 3 consecutive drops in pressure while standing, unfortunately no associated symptoms on this visit, however Thereasa Parkin has pt return to sitting after final value given its hypotensive nature. Final value has MAP of 64 which is below empirical threshold for organ perfusion.   10/13/21 1306  Therapy Vitals  Patient Position (if appropriate) Orthostatic Vitals  Orthostatic Sitting  BP- Sitting 104/49  Pulse- Sitting 88  Orthostatic Standing at 0 minutes  BP- Standing at 0 minutes (!) 89/47  Pulse- Standing at 0 minutes 91  Orthostatic Standing at 3 minutes  BP- Standing at 3 minutes (!) 79/57  Pulse- Standing at 3 minutes 57  Oxygen Therapy  SpO2 96 %  O2 Device Room Air  Pt requires minA to stand with RW. AMB with RW is slow and requires chair follow due to limited symptomology, positive orthostasis, and history of syncope. Pt able to complete session on room air, SpO2>94%. Pt remains weak and far off her baseline, still needs high frequency rehab services to return to her baseline level of living alone at home. WIll continue to follow.      Recommendations for follow up therapy are one component of a multi-disciplinary discharge planning process, led by the attending physician.  Recommendations may be updated based on patient status, additional functional criteria and insurance  authorization.  Follow Up Recommendations  Skilled nursing-short term rehab (<3 hours/day)     Assistance Recommended at Discharge Intermittent Supervision/Assistance  Equipment Recommendations  None recommended by PT    Recommendations for Other Services       Precautions / Restrictions Precautions Precautions: Fall Precaution Comments: orthostatic, PMH syncope Restrictions Weight Bearing Restrictions: No     Mobility  Bed Mobility               General bed mobility comments: in recliner    Transfers Overall transfer level: Needs assistance Equipment used: Rolling walker (2 wheels) Transfers: Sit to/from Stand Sit to Stand: Min assist           General transfer comment: weakness of legs, but no difficulty establishing upright balance unlike 2 days prior with continued retropulsion.    Ambulation/Gait Ambulation/Gait assistance: Min assist (minGuard + chair follow) Gait Distance (Feet): 90 Feet Assistive device: Rolling walker (2 wheels) Gait Pattern/deviations: Step-to pattern       General Gait Details: slow , but steady with RW, has subjective unsteadiness; eventually becomes more drowsy appearing and reports need to sit; after 3 minutes seated recovery interval returns continues 81ft back to room.   Stairs             Wheelchair Mobility    Modified Rankin (Stroke Patients Only)       Balance Overall balance assessment: Modified Independent;Mild deficits observed, not formally tested (able to stand and wipe, don doff undergarmets for toiletting needs)  Cognition Arousal/Alertness: Awake/alert Behavior During Therapy: WFL for tasks assessed/performed Overall Cognitive Status: Difficult to assess                                 General Comments: WFL, but has no recollection of PT eval 2 days prior. May be related to procedure, DTR reports this as a known  phenomenon.        Exercises      General Comments        Pertinent Vitals/Pain Pain Assessment: No/denies pain    Home Living                          Prior Function            PT Goals (current goals can now be found in the care plan section) Acute Rehab PT Goals Patient Stated Goal: makes up her recent decline PT Goal Formulation: With patient Time For Goal Achievement: 10/25/21 Potential to Achieve Goals: Good Progress towards PT goals: Progressing toward goals    Frequency    Min 2X/week      PT Plan Current plan remains appropriate    Co-evaluation              AM-PAC PT "6 Clicks" Mobility   Outcome Measure  Help needed turning from your back to your side while in a flat bed without using bedrails?: A Lot Help needed moving from lying on your back to sitting on the side of a flat bed without using bedrails?: A Lot Help needed moving to and from a bed to a chair (including a wheelchair)?: A Lot Help needed standing up from a chair using your arms (e.g., wheelchair or bedside chair)?: A Lot Help needed to walk in hospital room?: A Lot Help needed climbing 3-5 steps with a railing? : A Lot 6 Click Score: 12    End of Session Equipment Utilized During Treatment: Gait belt (chair follow) Activity Tolerance: Patient tolerated treatment well;Treatment limited secondary to medical complications (Comment);Patient limited by fatigue Patient left: in chair;with family/visitor present;with call bell/phone within reach Nurse Communication: Mobility status;Precautions PT Visit Diagnosis: Unsteadiness on feet (R26.81);Difficulty in walking, not elsewhere classified (R26.2);Other abnormalities of gait and mobility (R26.89);Other symptoms and signs involving the nervous system (R29.898)     Time: 1300-1340 PT Time Calculation (min) (ACUTE ONLY): 40 min  Charges:  $Therapeutic Activity: 38-52 mins                    1:59 PM, 10/13/21 Rosamaria Lints, PT, DPT Physical Therapist - [facility]  [phone] (ASCOM)     Remmy Riffe C 10/13/2021, 1:53 PM

## 2021-10-14 LAB — BASIC METABOLIC PANEL
Anion gap: 9 (ref 5–15)
BUN: 22 mg/dL (ref 8–23)
CO2: 30 mmol/L (ref 22–32)
Calcium: 8.7 mg/dL — ABNORMAL LOW (ref 8.9–10.3)
Chloride: 93 mmol/L — ABNORMAL LOW (ref 98–111)
Creatinine, Ser: 0.85 mg/dL (ref 0.44–1.00)
GFR, Estimated: >60 mL/min (ref >60)
Glucose, Bld: 173 mg/dL — ABNORMAL HIGH (ref 70–99)
Potassium: 3.5 mmol/L (ref 3.5–5.1)
Sodium: 132 mmol/L — ABNORMAL LOW (ref 135–145)

## 2021-10-14 LAB — GLUCOSE, CAPILLARY
Glucose-Capillary: 181 mg/dL — ABNORMAL HIGH (ref 70–99)
Glucose-Capillary: 183 mg/dL — ABNORMAL HIGH (ref 70–99)
Glucose-Capillary: 137 mg/dL — ABNORMAL HIGH (ref 70–99)
Glucose-Capillary: 167 mg/dL — ABNORMAL HIGH (ref 70–99)

## 2021-10-14 MED ORDER — POLYETHYLENE GLYCOL 3350 17 G PO PACK
17.0000 g | PACK | Freq: Every day | ORAL | Status: DC
  Administered 2021-10-15: 17 g via ORAL
  Filled 2021-10-14 (×2): qty 1

## 2021-10-14 MED ORDER — SIMETHICONE 80 MG PO CHEW
160.0000 mg | CHEWABLE_TABLET | Freq: Once | ORAL | Status: AC
  Administered 2021-10-14: 160 mg via ORAL
  Filled 2021-10-14: qty 2

## 2021-10-14 MED ORDER — MAGNESIUM HYDROXIDE 400 MG/5ML PO SUSP
30.0000 mL | Freq: Every evening | ORAL | Status: DC | PRN
  Administered 2021-10-14: 30 mL via ORAL
  Filled 2021-10-14: qty 30

## 2021-10-14 MED ORDER — DOCUSATE SODIUM 100 MG PO CAPS
100.0000 mg | ORAL_CAPSULE | Freq: Two times a day (BID) | ORAL | Status: DC
  Administered 2021-10-14 – 2021-10-15 (×3): 100 mg via ORAL
  Filled 2021-10-14 (×3): qty 1

## 2021-10-14 NOTE — Assessment & Plan Note (Signed)
Blood sugar dropped to 69 1 day due to poor p.o. intake the night before.  Otherwise patient actually has hyperglycemia. Continuing current sliding scale insulin along with Lantus.

## 2021-10-14 NOTE — Evaluation (Signed)
Occupational Therapy Evaluation Patient Details Name: Kylie Thomas MRN: [id number] DOB: 01-06-1951 Today's Date: 10/14/2021   History of Present Illness Kylie Thomas is a 70yoF who comes to [facility] on 11/14 c SOB. PMH: dCHF, HTN, CKD3a, CAD s/p CABG, PPM, MVR s/p valve replacement, AF on eliquis, CRF on 2-3L at home, fifth toe wound. Pt also recently with acute PE September. Pt underwent thoracentesis 11/15 yielding fluid.   Clinical Impression   Patient presenting with decreased Ind in self care,balance, functional mobility/transfers, endurance, and safety awareness. Patient reports living at home alone and independently. She has had multiple illnesses and hospitalizations and has not been home in several months per her report. Pt refuses to wear abdominal binder for management of hypotension. OT educates pt on importance and purpose but she declines. Patient currently functioning at min A overall with posterior bias in standing and with functional mobility within the room. BP seated of 102/52 and after standing 98/54. Pt does fatigue quickly and returning back to recliner chair at end of session. Patient will benefit from acute OT to increase overall independence in the areas of ADLs, functional mobility, and safety awareness in order to safely discharge home with family.      Recommendations for follow up therapy are one component of a multi-disciplinary discharge planning process, led by the attending physician.  Recommendations may be updated based on patient status, additional functional criteria and insurance authorization.   Follow Up Recommendations  Home health OT    Assistance Recommended at Discharge Intermittent Supervision/Assistance  Functional Status Assessment  Patient has had a recent decline in their functional status and demonstrates the ability to make significant improvements in function in a reasonable and predictable amount of time.  Equipment Recommendations   BSC/3in1       Precautions / Restrictions Precautions Precautions: Fall Precaution Comments: orthostatic, PMH syncope      Mobility Bed Mobility               General bed mobility comments: seated in recliner chair    Transfers Overall transfer level: Needs assistance Equipment used: Rolling walker (2 wheels) Transfers: Sit to/from Stand;Bed to chair/wheelchair/BSC Sit to Stand: Min assist Stand pivot transfers: Min assist                Balance Overall balance assessment: Needs assistance Sitting-balance support: Feet supported Sitting balance-Leahy Scale: Good     Standing balance support: Reliant on assistive device for balance;During functional activity Standing balance-Leahy Scale: Fair Standing balance comment: posterior bias in standing                           ADL either performed or assessed with clinical judgement   ADL Overall ADL's : Needs assistance/impaired     Grooming: Wash/dry hands;Wash/dry face;Set up;Sitting                   Toilet Transfer: Minimal assistance;Ambulation;Rolling walker (2 wheels) Toilet Transfer Details (indicate cue type and reason): simulated- posterior bias                 Vision Patient Visual Report: No change from baseline              Pertinent Vitals/Pain Pain Assessment: No/denies pain     Hand Dominance Right   Extremity/Trunk Assessment Upper Extremity Assessment Upper Extremity Assessment: Generalized weakness;Overall [facility] for tasks assessed   Lower Extremity Assessment Lower Extremity Assessment: Generalized weakness;Overall [facility]  for tasks assessed       Communication Communication Communication: No difficulties   Cognition Arousal/Alertness: Awake/alert Behavior During Therapy: WFL for tasks assessed/performed Overall Cognitive Status: Within Functional Limits for tasks assessed                                                  Home  Living Family/patient expects to be discharged to:: Private residence Living Arrangements: Alone Available Help at Discharge: Family Type of Home: House Home Access: Stairs to enter Technical brewer of Steps: 6 Entrance Stairs-Rails: Left;Right Home Layout: 1/2 bath on main level;Two level;Bed/bath upstairs;Able to live on main level with bedroom/bathroom     Bathroom Shower/Tub: Walk-in shower         Home Equipment: Conservation officer, nature (2 wheels);Shower seat;Rollator (4 wheels)          Prior Functioning/Environment Prior Level of Function : Independent/Modified Independent                        OT Problem List: Decreased strength;Decreased activity tolerance;Impaired balance (sitting and/or standing);Decreased safety awareness      OT Treatment/Interventions: Self-care/ADL training;Therapeutic exercise;Therapeutic activities;Energy conservation;Patient/family education;DME and/or AE instruction;Balance training;Manual therapy    OT Goals(Current goals can be found in the care plan section) Acute Rehab OT Goals Patient Stated Goal: to get better and go home OT Goal Formulation: With patient Time For Goal Achievement: 10/28/21 Potential to Achieve Goals: Fair ADL Goals Pt Will Perform Grooming: with supervision;standing Pt Will Perform Lower Body Dressing: with supervision;sit to/from stand Pt Will Transfer to Toilet: with supervision;ambulating Pt Will Perform Toileting - Clothing Manipulation and hygiene: with supervision;sit to/from stand  OT Frequency: Min 2X/week   Barriers to D/C:    lives alone but has support          AM-PAC OT "6 Clicks" Daily Activity     Outcome Measure Help from another person eating meals?: None Help from another person taking care of personal grooming?: A Little Help from another person toileting, which includes using toliet, bedpan, or urinal?: A Little Help from another person bathing (including washing, rinsing,  drying)?: A Little Help from another person to put on and taking off regular upper body clothing?: A Little Help from another person to put on and taking off regular lower body clothing?: A Little 6 Click Score: 19   End of Session Equipment Utilized During Treatment: Rolling walker (2 wheels) Nurse Communication: Mobility status  Activity Tolerance: Patient tolerated treatment well Patient left: in bed;with call bell/phone within reach;with bed alarm set  OT Visit Diagnosis: Unsteadiness on feet (R26.81);Repeated falls (R29.6);Muscle weakness (generalized) (M62.81);History of falling (Z91.81)                Time: 0940-1004 OT Time Calculation (min): 24 min Charges:  OT General Charges $OT Visit: 1 Visit OT Evaluation $OT Eval Low Complexity: 1 Low OT Treatments $Self Care/Home Management : 8-22 mins  Darleen Crocker, MS, OTR/L , CBIS ascom [phone]  10/14/21, 1:28 PM

## 2021-10-14 NOTE — Progress Notes (Signed)
[facility] Progress Note  Patient: Kylie Thomas    [id number]  DOA: 10/10/2021    Date of Service: the patient was seen and examined on 10/14/2021  Brief hospital course: Presented with numbness or shortness of breath.  Found to have recurrent right pleural effusion.  SP IR guided thoracentesis.  Now awaiting placement.  Medically stable.  Assessment and Plan:  * Recurrent right pleural effusion IR was consulted.  1200 mL removed. Analysis shows transudative effusion.  Most likely associated with heart failure. Continue Lasix.  Acute on chronic respiratory failure with hypoxia (HCC) Oxygenation improving.  After removal of the Foley.  Monitor.  Acute on chronic combined systolic and diastolic CHF (congestive heart failure) ([place]) Presents with complaints of shortness of breath.  Treated with IV Lasix. Currently on p.o. Lasix 40 mg daily. Blood pressure is soft to add ACE ARB inhibitor or Aldactone at this time.  Also patient on Toprol-XL which is currently on hold.  Orthostatic hypotension Blood pressure dropped significantly on change of position.  Holding other antihypertensive medication other than Lasix. Not a good candidate for volume expansion. Currently on midodrine and abdominal binder.  S/P CABG (coronary artery bypass graft) Surgery done at [facility]. Along with mitral valve repair. Likely the cause for patient's recurrent pleural effusion. Recommend outpatient follow-up with cardiology.  Pacemaker Chronic pacemaker implant.  Monitor.  Chronic anticoagulation See atrial fibrillation.  Atrial fibrillation, chronic (HCC) On Eliquis. Resuming. Tolerating  Anemia Eliquis and Plavix and aspirin were initially on hold.  He is now resuming.  Still tolerating.  No acute bleeding.  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  CKD (chronic kidney disease), stage IIIa Renal function improving.  Monitor.  Avoid nephrotoxic medication.  Type 2 diabetes mellitus  uncontrolled with hyper and hypoglycemia with hyperlipidemia (HCC) Blood sugar dropped to 69 1 day due to poor p.o. intake the night before.  Otherwise patient actually has hyperglycemia. Continuing current sliding scale insulin along with Lantus.  Pressure injury of skin Continue foam dressing. Stage I lower back.    Body mass index is 19.42 kg/m.    Pressure Injury 10/11/21 Vertebral column Lower Stage 1 -  Intact skin with non-blanchable redness of a localized area usually over a bony prominence. (Active)  10/11/21 1822  Location: Vertebral column  Location Orientation: Lower  Staging: Stage 1 -  Intact skin with non-blanchable redness of a localized area usually over a bony prominence.  Wound Description (Comments):   Present on Admission: Yes     Subjective: No acute complaint.  No nausea no vomiting.  No fever no chills.  Oral intake improving.  Physical Exam: General: Appear in mild distress, no Rash; Oral Mucosa Clear, moist. no Abnormal Neck Mass Or lumps, Conjunctiva normal  Cardiovascular: S1 and S2 Present, no Murmur, Respiratory: good respiratory effort, Bilateral Air entry present and CTA, no Crackles, no wheezes Abdomen: Bowel Sound present, Soft and no tenderness Extremities: no Pedal edema Neurology: alert and oriented to time, place, and person affect appropriate. no new focal deficit Gait not checked due to patient safety concerns  Data Reviewed: My review of labs, imaging, notes and other tests shows no new significant findings.   Disposition:  Status is: Inpatient  Remains inpatient appropriate because: Patient has appealed for SNF placement.  Currently awaiting information from them.   Family Communication: no family was present at bedside, at the time of interview.   DVT Prophylaxis: SCDs Start: 10/10/21 2136 apixaban (ELIQUIS) tablet 5 mg   Time spent:  35 minutes. I reviewed all nursing notes, pharmacy notes, vitals, pertinent old records. I  have discussed plan of care as described above with RN.  Author: Lynden Oxford  10/14/2021 6:34 PM  To reach On-call, see care teams to locate the attending and reach out via [email or web address]. Between 7PM-7AM, please contact night-coverage If you still have difficulty reaching the attending provider, please page the [facility] (Director on Call) for [facility] on amion for assistance.

## 2021-10-14 NOTE — Progress Notes (Signed)
[facility]  Patient Description: Kylie Thomas is a 70 year old female patient with PMH significant for multivessel CAD, s/p CABG x2 with atrial appendage ligation and MVR (on 07/25/2021) with an extensive hospital course that was complicated by post operative atrial fibrillation and CHB requiring PPM placement, bilateral pleural effusions and LUL segmental PE; HFpEF, acute PE (on eliquis), hypertension, diabetes mellitus type 2 and CKD stage III who was admitted for acute on chronic diastolic CHF and acute respiratory failure with hypoxia.  Cardiology consulted for CHF management.  SUBJECTIVE: The patient reports to be doing well on today and states that she is ready to go home. She denies having any chest pain, peripheral edema, syncope or dizziness at this time.  She reports that she has been able to ambulate within her room without any issues.  The patient denies having any s/s of abnormal bleeding since restarting the Eliquis therapy.  OBJECTIVE:   Vitals:   10/14/21 0537 10/14/21 0809 10/14/21 1112 10/14/21 1454  BP:  (!) 90/48 (!) 92/44 (!) 90/51  Pulse:  94 96 81  Resp:  18 18 19   Temp:  98.3 F (36.8 C) 98.4 F (36.9 C) 98.3 F (36.8 C)  TempSrc:   Oral Oral  SpO2:  96% 96% 96%  Weight: 45.1 kg     Height:         Intake/Output Summary (Last 24 hours) at 10/14/2021 1601 Last data filed at 10/14/2021 1453 Gross per 24 hour  Intake 480 ml  Output 2110 ml  Net -1630 ml      PHYSICAL EXAM  General: Well developed, well nourished, in no acute distress HEENT:  Normocephalic. Has staples to the occiput that are dry, intact and without signs of infection or hematoma.  Neck:  No JVD.  Lungs: diffuse crackles to auscultation at the bases of the lungs, clear to auscultation to the upper lobes.  Chest expansion symmetrical.  No wheezes, rales or rhonchi. Heart: HRRR . Normal S1 and S2 without gallops or murmurs.  Abdomen: Bowel sounds are positive, abdomen soft and non-tender   Msk:  Back normal, normal gait. Normal strength and tone for age. Extremities: No clubbing, cyanosis or edema.   Neuro: Alert and oriented X 3. Psych:  Good affect, responds appropriately   LABS: Basic Metabolic Panel: Recent Labs    10/13/21 0653 10/14/21 0542  NA 133* 132*  K 3.6 3.5  CL 89* 93*  CO2 31 30  GLUCOSE 282* 173*  BUN 26* 22  CREATININE 0.80 0.85  CALCIUM 8.7* 8.7*   Liver Function Tests: Recent Labs    10/12/21 0831  AST 15  ALT 7  ALKPHOS 84  BILITOT 0.3  PROT 6.7  ALBUMIN 2.8*   No results for input(s): LIPASE, AMYLASE in the last 72 hours. CBC: Recent Labs    10/12/21 0526 10/13/21 0653  WBC 7.7 8.2  NEUTROABS  --  6.2  HGB 9.1* 10.2*  HCT 30.0* 34.1*  MCV 91.7 95.5  PLT 323 320   Cardiac Enzymes: No results for input(s): CKTOTAL, CKMB, CKMBINDEX, TROPONINI in the last 72 hours. BNP: Invalid input(s): POCBNP D-Dimer: No results for input(s): DDIMER in the last 72 hours. Hemoglobin A1C: No results for input(s): HGBA1C in the last 72 hours. Fasting Lipid Panel: No results for input(s): CHOL, HDL, LDLCALC, TRIG, CHOLHDL, LDLDIRECT in the last 72 hours. Thyroid Function Tests: No results for input(s): TSH, T4TOTAL, T3FREE, THYROIDAB in the last 72 hours.  Invalid input(s): FREET3 Anemia Panel: No  results for input(s): VITAMINB12, FOLATE, FERRITIN, TIBC, IRON, RETICCTPCT in the last 72 hours.  No results found.   Echo: performed 08/22/2021 at [facility] INTERPRETATION ---------------------------------------------------------------    MILD LV DYSFUNCTION (See above) WITH MILD LVH    NORMAL RIGHT VENTRICULAR SYSTOLIC FUNCTION    VALVULAR REGURGITATION: MILD AR, TRIVIAL MR, TRIVIAL PR, MILD TR    PROSTHETIC VALVE(S): BIOPROSTHETIC MV  Closest EF: 50% (Estimated)  Calc.EF: 50% (3D)   3D acquisition and reconstructions were performed as part of this    examination to more accurately quantify the effects of identified    structural  abnormalities as part of the exam.    RHC: (performed 09/07/2021 at [facility] ) Impressions: 1. Normal/low right and left sided filling pressures  2. No pulmonary HTN  3. PA sat 58%, Art sat 93% (on 2L [place])  4. Preserved CO and CI (2.3 L/min/m2)   TELEMETRY: Ventricular Paced, HR of 85 bpm  ASSESSMENT AND PLAN:  Principal Problem:   Recurrent right pleural effusion Active Problems:   Acute on chronic combined systolic and diastolic CHF (congestive heart failure) (HCC)   Acute on chronic respiratory failure with hypoxia (HCC)   HTN (hypertension)   Type 2 diabetes mellitus with hyperlipidemia (HCC)   CKD (chronic kidney disease), stage IIIa   Atrial fibrillation, chronic (HCC)   Hyperglycemia due to type 2 diabetes mellitus (HCC)   S/P CABG (coronary artery bypass graft)   Chronic anticoagulation   Pacemaker   S/P mitral valve replacement   Anemia   Pressure injury of skin    # Acute on chronic combined systolic and diastolic CHF  Patient's most recent echocardiogram from 07/2021 at [facility] revealed mild LV systolic function with mild LVH, an estimated EF of 50%, mild AR and mild TR with bioprosthetic MV.  Right heart cath from 08/2021 revealed no evidence of pulmonary hypertension, low normal right and left-sided filling pressures and preserved CO and CI. Patient appears euvolemic on today.   Recommendations:  -Continue diuresis with oral Lasix as bp allows.  -Continue to hold beta-blocker, MRA and ARB at this time due to hypotension. Consider restarting as outpatient.   -Continue CHF management per protocol with daily weights, low-sodium diet and strict I's and O's.  # Paroxsymal atrial fibrillation # s/p left appendage ligation # CHB s/p PPM  Patient continues to be V-paced on the monitor with a controlled heart rate. Was restarted on eliquis yesterday without any s/s of abnormal bleeding overnight.  -Continue Eliquis for anticoagulation in a patient with a CHA2DS2-VASc score of  6.  - Monitor closely for s/s of abnormal bleeding.   -Beta-blocker held as above.   # Coronary artery disease s/p CABG x2 with MVR # Right-sided pleural effusions, recurrent Patient had an complicated hospital course s/p CABGx2 with left atrial appendage ligation and MVR on 07/25/2021 with related complications of postop atrial fibrillation, complete heart block requiring PPM placement, bilateral pleural effusions and LUL segmental PE that was treated with the insertion of bilateral pigtails and aggressive diuresis. During this current admission the patient was found to have recurent Right-side pleural effusion and underwent ultrasound-guided thoracentesis on 10/11/2021 which yielded 1200 cc of pleural fluid.  -Continue ASA and statin therapy for CAD management.  -Beta-blocker held as per above.  -ARB held as above.   #Acute respiratory failure with hypoxia Patient currently on room air with oxygen saturations greater than 93%.  -Agree with current management.  #Orthostatic hypotension # Diabetes mellitus type 2 Patient's blood  pressure has been between around [id number] systolic over the last 24 hours.   -Agree with midodrine therapy.   - antihypertensive held as above.    # CKD stage IIIa # Anemia  BUN/creatinine has improved since admission.  H&H stable at 10.2/34.1 at this time.  -Agree with current management per primary team.   Gladstone Pih, ACNPC-AG  10/14/2021 4:01 PM

## 2021-10-14 NOTE — Assessment & Plan Note (Signed)
Surgery done at [facility]. Along with mitral valve repair. Likely the cause for patient's recurrent pleural effusion. Recommend outpatient follow-up with cardiology.

## 2021-10-14 NOTE — Assessment & Plan Note (Signed)
See atrial fibrillation ?

## 2021-10-14 NOTE — Assessment & Plan Note (Signed)
Eliquis and Plavix and aspirin were initially on hold.  He is now resuming.  Still tolerating.  No acute bleeding.  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

## 2021-10-14 NOTE — NC FL2 (Signed)
[place] LEVEL OF CARE SCREENING TOOL     IDENTIFICATION  Patient Name: Kylie Thomas Birthdate: Mar 05, 1951 Sex: female Admission Date (Current Location): 10/10/2021  [facility] and [place] Number:  Engineering geologist and Address:  [facility], [address], [place], [place] [address]      Provider Number: [id number]  Attending Physician Name and Address:  Lavina Hamman, MD  Relative Name and Phone Number:  Estill Bamberg (daughter) [phone]    Current Level of Care: Hospital Recommended Level of Care: [place] Prior Approval Number:    Date Approved/Denied:   PASRR Number: [id number]  Discharge Plan: Other (Comment) [facility])    Current Diagnoses: Patient Active Problem List   Diagnosis Date Noted   Pressure injury of skin 10/12/2021   Hypotension    Stage 2 chronic kidney disease    Recurrent right pleural effusion 10/10/2021   Hyperglycemia due to type 2 diabetes mellitus ([place]) 10/10/2021   S/P CABG (coronary artery bypass graft) 10/10/2021   Chronic anticoagulation 10/10/2021   Pacemaker 10/10/2021   S/P mitral valve replacement 10/10/2021   Anemia 10/10/2021   Arrhythmia 06/01/2021   CHF exacerbation ([place]) 05/15/2021   Acute on chronic combined systolic and diastolic CHF (congestive heart failure) ([place]) 05/15/2021   Acute on chronic respiratory failure with hypoxia (H[facility]) 05/15/2021   HTN (hypertension) 05/15/2021   HLD (hyperlipidemia) 05/15/2021   Type 2 diabetes mellitus with hyperlipidemia ([place]) 05/15/2021   Depression with anxiety 05/15/2021   CKD (chronic kidney disease), stage IIIa 05/15/2021   CAD (coronary artery disease) 05/15/2021   Atrial fibrillation, chronic ([place]) 05/15/2021   Hypothermia 05/15/2021   Sepsis ([place]) 05/15/2021   Elevated troponin 05/15/2021   UTI (urinary tract infection) 05/15/2021   Acquired trigger finger 04/14/2020   Localized, primary osteoarthritis of hand  04/14/2020   Ptosis of both eyelids 07/10/2017   Age-related nuclear cataract of left eye 02/14/2017   Nuclear sclerosis of both eyes 12/27/2016   Nonsenile nuclear cataract of both eyes 08/04/2014   Type II or unspecified type diabetes mellitus with ophthalmic manifestations, uncontrolled(250.52) 11/06/2013   Type II or unspecified type diabetes mellitus without mention of complication, uncontrolled 11/06/2013   GAD (generalized anxiety disorder) 03/07/2013   Plantar fasciitis of left foot 03/07/2013   Symptomatic menopausal or female climacteric states 03/07/2013    Orientation RESPIRATION BLADDER Height & Weight     Place, Self, Time, Situation  O2 (2L nasal cannula) Incontinent, External catheter Weight: 99 lb 6.8 oz (45.1 kg) Height:  5' (152.4 cm)  BEHAVIORAL SYMPTOMS/MOOD NEUROLOGICAL BOWEL NUTRITION STATUS      Incontinent Diet (carb modified)  AMBULATORY STATUS COMMUNICATION OF NEEDS Skin   Limited Assist Verbally Other (Comment) (abrasion abdomen)                       Personal Care Assistance Level of Assistance  Bathing, Feeding, Dressing, Total care Bathing Assistance: Limited assistance Feeding assistance: Independent Dressing Assistance: Limited assistance Total Care Assistance: Limited assistance   Functional Limitations Info  Sight, Hearing, Speech Sight Info: Adequate Hearing Info: Adequate Speech Info: Adequate    SPECIAL CARE FACTORS FREQUENCY  PT (By licensed PT), OT (By licensed OT)     PT Frequency: min 3x weekly OT Frequency: min 3x weekly            Contractures Contractures Info: Not present    Additional Factors Info  Code Status, Allergies Code Status Info:  full Allergies Info: atorvastatin, remifentanil, fentanyl, sulfa antibiotics           Current Medications (10/14/2021):  This is the current hospital active medication list Current Facility-Administered Medications  Medication Dose Route Frequency Provider Last Rate Last  Admin   0.9 %  sodium chloride infusion  250 mL Intravenous PRN Athena Masse, MD   Stopped at 10/12/21 [phone]   acetaminophen (TYLENOL) tablet 650 mg  650 mg Oral Q4H PRN Athena Masse, MD   650 mg at 10/12/21 1406   albuterol (VENTOLIN HFA) 108 (90 Base) MCG/ACT inhaler 2 puff  2 puff Inhalation Q2H PRN Vladimir Crofts, MD       apixaban Arne Cleveland) tablet 5 mg  5 mg Oral BID Lavina Hamman, MD   5 mg at 10/14/21 0800   aspirin chewable tablet 81 mg  81 mg Oral Daily Athena Masse, MD   81 mg at 10/14/21 0800   bisacodyl (DULCOLAX) EC tablet 5 mg  5 mg Oral Daily PRN Athena Masse, MD       dicyclomine (BENTYL) capsule 10 mg  10 mg Oral TID Lavina Hamman, MD   10 mg at 10/14/21 0800   docusate sodium (COLACE) capsule 100 mg  100 mg Oral BID Lavina Hamman, MD       dorzolamide-timolol (COSOPT) 22.3-6.8 MG/ML ophthalmic solution 1 drop  1 drop Both Eyes BID Judd Gaudier V, MD   1 drop at 10/14/21 0802   DULoxetine (CYMBALTA) DR capsule 60 mg  60 mg Oral BID Athena Masse, MD   60 mg at 10/14/21 0800   furosemide (LASIX) tablet 40 mg  40 mg Oral Daily Lavina Hamman, MD   40 mg at 10/14/21 0800   gabapentin (NEURONTIN) capsule 300 mg  300 mg Oral TID Lavina Hamman, MD   300 mg at 10/14/21 0800   hydrocortisone (ANUSOL-HC) 2.5 % rectal cream   Rectal BID Lavina Hamman, MD   Given at 10/14/21 0756   insulin aspart (novoLOG) injection 0-15 Units  0-15 Units Subcutaneous TID WC Athena Masse, MD   3 Units at 10/14/21 0817   insulin aspart (novoLOG) injection 0-5 Units  0-5 Units Subcutaneous QHS Athena Masse, MD   3 Units at 10/12/21 2150   insulin glargine-yfgn (SEMGLEE) injection 12 Units  12 Units Subcutaneous Daily Lavina Hamman, MD   12 Units at 10/14/21 0800   levothyroxine (SYNTHROID) tablet 112 mcg  112 mcg Oral Q0600 Athena Masse, MD   112 mcg at 10/14/21 0534   melatonin tablet 5 mg  5 mg Oral QHS PRN Athena Masse, MD       midodrine (PROAMATINE) tablet 2.5 mg  2.5  mg Oral BID WC Lavina Hamman, MD   2.5 mg at 10/14/21 0759   mupirocin ointment (BACTROBAN) 2 % 1 application  1 application Nasal BID Athena Masse, MD   1 application at [id number]   ondansetron (ZOFRAN) injection 4 mg  4 mg Intravenous Q6H PRN Athena Masse, MD   4 mg at 10/13/21 1842   ondansetron (ZOFRAN) tablet 4 mg  4 mg Oral Q6H PRN Lavina Hamman, MD   4 mg at 10/14/21 0815   oxymetazoline (AFRIN) 0.05 % nasal spray 1 spray  1 spray Each Nare BID PRN Lavina Hamman, MD       pantoprazole (PROTONIX) EC tablet 40 mg  40 mg Oral Daily Damita Dunnings,  Odetta Pink, MD   40 mg at 10/14/21 0800   polyethylene glycol (MIRALAX / GLYCOLAX) packet 17 g  17 g Oral Daily PRN Andris Baumann, MD       polyethylene glycol (MIRALAX / GLYCOLAX) packet 17 g  17 g Oral Daily Rolly Salter, MD       povidone-iodine (BETADINE) 10 % external solution   Topical PRN Rolly Salter, MD       rosuvastatin (CRESTOR) tablet 40 mg  40 mg Oral q morning Lindajo Royal V, MD   40 mg at 10/14/21 0813   sodium chloride (OCEAN) 0.65 % nasal spray 1 spray  1 spray Each Nare PRN Rolly Salter, MD   1 spray at 10/13/21 0915   sodium chloride flush (NS) 0.9 % injection 3 mL  3 mL Intravenous Q12H Lindajo Royal V, MD   3 mL at 10/14/21 0804   sodium chloride flush (NS) 0.9 % injection 3 mL  3 mL Intravenous PRN Andris Baumann, MD   3 mL at 10/10/21 2312   traMADol (ULTRAM) tablet 100 mg  100 mg Oral Q8H PRN Rolly Salter, MD         Discharge Medications: Please see discharge summary for a list of discharge medications.  Relevant Imaging Results:  Relevant Lab Results:   Additional Information SSN: [id number]  Gildardo Griffes, LCSW

## 2021-10-14 NOTE — Assessment & Plan Note (Signed)
Chronic pacemaker implant.  Monitor.

## 2021-10-14 NOTE — Assessment & Plan Note (Signed)
Oxygenation improving.  After removal of the Foley.  Monitor.

## 2021-10-14 NOTE — Assessment & Plan Note (Signed)
Renal function improving.  Monitor.  Avoid nephrotoxic medication.

## 2021-10-14 NOTE — Progress Notes (Addendum)
Pt c/o sharp chest pain 5/10 intermittently. 2L O2 applied with relief, vitals obtained.  MD paged and made aware. Pt states she takes nitro at home since open heart surgery Aug 2022. Chronic soft Bps, started Midodrine today. Pt noted to have orthostatic hypotension and abdominal binder in place to help assist with increase in BP.

## 2021-10-14 NOTE — Assessment & Plan Note (Signed)
Continue foam dressing. Stage I lower back.

## 2021-10-14 NOTE — Assessment & Plan Note (Signed)
Blood pressure dropped significantly on change of position.  Holding other antihypertensive medication other than Lasix. Not a good candidate for volume expansion. Currently on midodrine and abdominal binder.

## 2021-10-14 NOTE — Progress Notes (Signed)
Cross Cover Patient complained of substernal chest pain, without radiation 5/10. Pain improved 3-4/10 with supplemental oxygen at 2 l.. Patient states she feels it is indigestion/gas and reports constipation.  EKG - atrial sensed, ventricular paced rhythm Simethicone - ordered Milk of mag - ordered

## 2021-10-14 NOTE — Assessment & Plan Note (Signed)
Presents with complaints of shortness of breath.  Treated with IV Lasix. Currently on p.o. Lasix 40 mg daily. Blood pressure is soft to add ACE ARB inhibitor or Aldactone at this time.  Also patient on Toprol-XL which is currently on hold.

## 2021-10-14 NOTE — Assessment & Plan Note (Signed)
On Eliquis. Resuming. Tolerating

## 2021-10-14 NOTE — Hospital Course (Signed)
Presented with numbness or shortness of breath.  Found to have recurrent right pleural effusion.  SP IR guided thoracentesis.  Now awaiting placement.  Medically stable.

## 2021-10-14 NOTE — TOC Progression Note (Addendum)
Transition of Care [facility]) - Progression Note    Patient Details  Name: Kylie Thomas MRN: [id number] Date of Birth: 26-Feb-1951  Transition of [facility]) CM/SW Contact  Gildardo Griffes, [place] Phone Number: 10/14/2021, 3:02 PM  Clinical Narrative:     CSW spoke with Elouise Munroe with [facility]  who reports she has completed assessment and is working on sending in the referral for approval.   Pending approval at [facility], should know later this evening or tomorrow morning. Harriett Sine reports if approved patient can potentially be discharged to [facility].   Daughter Marchelle Folks has been updated with the above.   Expected Discharge Plan: Skilled Nursing Facility Barriers to Discharge: Continued Medical Work up  Expected Discharge Plan and Services Expected Discharge Plan: Skilled Nursing Facility       Living arrangements for the past 2 months: Skilled Nursing Facility                                       Social Determinants of Health (SDOH) Interventions    Readmission Risk Interventions No flowsheet data found.

## 2021-10-14 NOTE — Assessment & Plan Note (Signed)
IR was consulted.  1200 mL removed. Analysis shows transudative effusion.  Most likely associated with heart failure. Continue Lasix.

## 2021-10-15 ENCOUNTER — Inpatient Hospital Stay: Payer: Medicare PPO

## 2021-10-15 LAB — CBC
HCT: 29.9 % — ABNORMAL LOW (ref 36.0–46.0)
Hemoglobin: 9.2 g/dL — ABNORMAL LOW (ref 12.0–15.0)
MCH: 28.7 pg (ref 26.0–34.0)
MCHC: 30.8 g/dL (ref 30.0–36.0)
MCV: 93.1 fL (ref 80.0–100.0)
Platelets: 313 10*3/uL (ref 150–400)
RBC: 3.21 MIL/uL — ABNORMAL LOW (ref 3.87–5.11)
RDW: 15.7 % — ABNORMAL HIGH (ref 11.5–15.5)
WBC: 8.5 10*3/uL (ref 4.0–10.5)
nRBC: 0 % (ref 0.0–0.2)

## 2021-10-15 LAB — BASIC METABOLIC PANEL
Anion gap: 7 (ref 5–15)
BUN: 27 mg/dL — ABNORMAL HIGH (ref 8–23)
CO2: 33 mmol/L — ABNORMAL HIGH (ref 22–32)
Calcium: 8.7 mg/dL — ABNORMAL LOW (ref 8.9–10.3)
Chloride: 92 mmol/L — ABNORMAL LOW (ref 98–111)
Creatinine, Ser: 0.98 mg/dL (ref 0.44–1.00)
GFR, Estimated: >60 mL/min (ref >60)
Glucose, Bld: 153 mg/dL — ABNORMAL HIGH (ref 70–99)
Potassium: 3.4 mmol/L — ABNORMAL LOW (ref 3.5–5.1)
Sodium: 132 mmol/L — ABNORMAL LOW (ref 135–145)

## 2021-10-15 LAB — GLUCOSE, CAPILLARY
Glucose-Capillary: 214 mg/dL — ABNORMAL HIGH (ref 70–99)
Glucose-Capillary: 221 mg/dL — ABNORMAL HIGH (ref 70–99)
Glucose-Capillary: 92 mg/dL (ref 70–99)

## 2021-10-15 LAB — BODY FLUID CULTURE W GRAM STAIN
Culture: NO GROWTH
Gram Stain: NONE SEEN

## 2021-10-15 LAB — TSH: TSH: 9.122 u[IU]/mL — ABNORMAL HIGH (ref 0.350–4.500)

## 2021-10-15 MED ORDER — LEVOTHYROXINE SODIUM 25 MCG PO TABS: 125.0000 ug | ORAL_TABLET | Freq: Every day | ORAL | Status: DC

## 2021-10-15 MED ORDER — LEVOTHYROXINE SODIUM 112 MCG PO TABS
112.0000 ug | ORAL_TABLET | Freq: Every day | ORAL | Status: DC
  Filled 2021-10-15: qty 1

## 2021-10-15 MED ORDER — FUROSEMIDE 40 MG PO TABS: 40.0000 mg | ORAL_TABLET | Freq: Every day | ORAL | 0 refills | Status: AC

## 2021-10-15 MED ORDER — LIDOCAINE-HYDROCORTISONE ACE 2-2 % RE KIT: 1.0000 "application " | PACK | Freq: Two times a day (BID) | RECTAL | 0 refills | Status: AC

## 2021-10-15 NOTE — Discharge Summary (Addendum)
Physician Discharge Summary   Patient name: Kylie Thomas  Admit date:     10/10/2021  Discharge date: 10/15/2021  Discharge Physician: Max Sane   PCP: Maeola Sarah, MD   Recommendations at discharge: Follow-up with PCP, cardiology and podiatry as requested  Discharge Diagnoses Principal Problem:   Recurrent right pleural effusion Active Problems:   Acute on chronic combined systolic and diastolic CHF (congestive heart failure) (HCC)   Acute on chronic respiratory failure with hypoxia (HCC)   Type 2 diabetes mellitus uncontrolled with hyper and hypoglycemia with hyperlipidemia (HCC)   CKD (chronic kidney disease), stage IIIa   Atrial fibrillation, chronic (HCC)   Hyperglycemia due to type 2 diabetes mellitus (HCC)   S/P CABG (coronary artery bypass graft)   Chronic anticoagulation   Pacemaker   S/P mitral valve replacement   Anemia   Orthostatic hypotension   Pressure injury of skin   Hospital Course   Presented with numbness or shortness of breath.  Found to have recurrent right pleural effusion.  SP IR guided thoracentesis.  Now awaiting placement.  Medically stable. Patient has bed at [facility] and is being discharged there today  * Recurrent right pleural effusion IR was consulted.  1200 mL removed. Analysis shows transudative effusion.  Most likely associated with heart failure. Continue Lasix.  Pressure injury of skin Continue foam dressing. Stage I lower back.  Orthostatic hypotension Blood pressure dropped significantly on change of position.  Holding other antihypertensive medication other than Lasix. Not a good candidate for volume expansion. Currently on midodrine and abdominal binder.  Anemia Eliquis and Plavix and aspirin were initially on hold.  He is now resuming.  Still tolerating.  No acute bleeding.  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  Pacemaker Chronic pacemaker implant.  Monitor.  Chronic anticoagulation See atrial  fibrillation.  S/P CABG (coronary artery bypass graft) Surgery done [facility]. Along with mitral valve repair. Likely the cause for patient's recurrent pleural effusion. Recommend outpatient follow-up with cardiology.  Atrial fibrillation, chronic (HCC) On Eliquis. Resuming. Tolerating  CKD (chronic kidney disease), stage IIIa Renal function improving.  Monitor.  Avoid nephrotoxic medication.  Type 2 diabetes mellitus uncontrolled with hyper and hypoglycemia with hyperlipidemia (HCC) Blood sugar dropped to 69 1 day due to poor p.o. intake the night before.  Otherwise patient actually has hyperglycemia. Continuing current sliding scale insulin along with Lantus.  Acute on chronic respiratory failure with hypoxia (HCC) Oxygenation improving.  After removal of the Foley.  Monitor.  Acute on chronic combined systolic and diastolic CHF (congestive heart failure) ([place]) Presents with complaints of shortness of breath.  Treated with IV Lasix. Currently on p.o. Lasix 40 mg daily. Blood pressure is soft to add ACE ARB inhibitor or Aldactone at this time.  Also patient on Toprol-XL which is currently on hold.  Net IO Since Admission: -1,448.02 mL [10/15/21 1532]   After discussion with cardiology and repeat chest x-ray which shows unchanged decision was made to discharge [facility] to [facility].  Patient is sitting in the chair comfortably without any hypoxia or dyspnea.  Her daughter is at bedside and agreeable with the discharge plan  She will need outpatient evaluation for pleurodesis considering recurrent effusion.  Procedures performed: Thoracentesis with removal of 1.2 L of fluid on 10/11/2021  Condition at discharge: good  Exam Physical Exam   General: Appear in mild distress, no Rash; Oral Mucosa Clear, moist. no Abnormal Neck Mass Or lumps, Conjunctiva normal  Cardiovascular: S1 and S2  Present, no Murmur, mild diffuse crackles at bases Respiratory: good respiratory effort,  Bilateral Air entry present and CTA patient sitting in the chair comfortably without any acute distress, no wheezes Abdomen: Bowel Sound present, Soft and no tenderness Extremities: no Pedal edema Neurology: alert and oriented to time, place, and person affect appropriate. no new focal deficit Gait not checked due to patient safety concerns   Disposition: [facility] for SNF  Discharge time: greater than 30 minutes.  Follow-up Information     Maeola Sarah, MD. Schedule an appointment as soon as possible for a visit in 3 day(s).   Specialty: Family Medicine Why: [facility] Discharge F/UP Contact information: [place] [address] [phone]         Corey Skains, MD. Schedule an appointment as soon as possible for a visit in 2 week(s).   Specialty: Cardiology Why: [facility] Discharge F/UP Contact information: [facility] [place] [address] [phone]         Edrick Kins, [place]. Schedule an appointment as soon as possible for a visit in 2 week(s).   Specialty: Podiatry Why: [facility] Discharge F/UP Contact information: [place] [address] [phone]                 Allergies as of 10/15/2021       Reactions   Atorvastatin Other (See Comments)   Other reaction(s): Arthralgia (Joint Pain) Severe cramps   Remifentanil    Other reaction(s): Irregular Heart Rate   Fentanyl    Sulfa Antibiotics Nausea Only, Other (See Comments)        Medication List     STOP taking these medications    amiodarone 200 MG tablet Commonly known as: PACERONE   atorvastatin 10 MG tablet Commonly known as: LIPITOR   cefadroxil 500 MG capsule Commonly known as: DURICEF   hyoscyamine 0.375 MG 12 hr tablet Commonly known as: LEVBID   isosorbide mononitrate 30 MG 24 hr tablet Commonly known as: IMDUR   loratadine 10 MG tablet Commonly known as:  CLARITIN   losartan 25 MG tablet Commonly known as: COZAAR   magnesium oxide 400 MG tablet Commonly known as: MAG-OX   mirtazapine 7.5 MG tablet Commonly known as: REMERON   spironolactone 25 MG tablet Commonly known as: ALDACTONE       TAKE these medications    acetaminophen 325 MG tablet Commonly known as: TYLENOL Take 2 tablets by mouth every 6 (six) hours as needed.   aspirin 81 MG chewable tablet Chew 1 tablet by mouth daily.   bisacodyl 5 MG EC tablet Commonly known as: DULCOLAX Take 1 tablet by mouth daily as needed.   dicyclomine 10 MG capsule Commonly known as: BENTYL Take 10 mg by mouth 3 (three) times daily.   dorzolamide-timolol 22.3-6.8 MG/ML ophthalmic solution Commonly known as: COSOPT Place 1 drop into both eyes 2 (two) times daily.   DULoxetine 60 MG capsule Commonly known as: CYMBALTA Take 60 mg by mouth 2 (two) times daily.   Eliquis 5 MG Tabs tablet Generic drug: apixaban Take 5 mg by mouth 2 (two) times daily. What changed: Another medication with the same name was removed. Continue taking this medication, and follow the directions you see here.   furosemide 40 MG tablet Commonly known as: LASIX Take 1 tablet (40 mg total) by mouth daily. Start taking on: October 16, 2021 What changed:  medication strength how much to take  gabapentin 300 MG capsule Commonly known as: NEURONTIN Take 1 capsule by mouth 3 (three) times daily.   glucose blood test strip OneTouch Ultra Blue Test Strip   insulin aspart 100 UNIT/ML injection Commonly known as: novoLOG Inject 4 Units into the skin with breakfast, with lunch, and with evening meal. And sliding scale   insulin glargine 100 UNIT/ML injection Commonly known as: LANTUS Inject 18 Units into the skin daily.   levothyroxine 112 MCG tablet Commonly known as: SYNTHROID Take 112 mcg by mouth daily before breakfast.   lidocaine 5 % Commonly known as: LIDODERM Place 1 patch onto the skin  daily. Remove & Discard patch within 12 hours or as directed by MD   Lidocaine-Hydrocortisone Ace 2-2 % Kit Place 1 application rectally in the morning and at bedtime.   melatonin 5 MG Tabs Take 1 tablet by mouth at bedtime as needed.   metoprolol succinate 50 MG 24 hr tablet Commonly known as: TOPROL-XL Take 12.5 mg by mouth daily.   nitroGLYCERIN 0.4 MG SL tablet Commonly known as: NITROSTAT Place 1 tablet (0.4 mg total) under the tongue every 5 (five) minutes as needed.   pantoprazole 20 MG tablet Commonly known as: PROTONIX Take by mouth.   polyethylene glycol powder 17 GM/SCOOP powder Commonly known as: GLYCOLAX/MIRALAX Take 1 Container by mouth daily as needed.   rosuvastatin 40 MG tablet Commonly known as: CRESTOR Take 40 mg by mouth every morning.   traMADol 50 MG tablet Commonly known as: ULTRAM Take 100 mg by mouth every 8 (eight) hours as needed.   Trulicity 4.40 HK/7.4QV Sopn Generic drug: Dulaglutide Inject 1.5 mg into the skin daily. Every Friday   vitamin C 500 MG tablet Commonly known as: ASCORBIC ACID Take 500 mg by mouth 2 (two) times daily.   zinc gluconate 50 MG tablet Take 50 mg by mouth daily.               Durable Medical Equipment  (From admission, onward)           Start     Ordered   10/15/21 1316  For home use only DME Shower stool  Once        10/15/21 1315   10/15/21 1316  For home use only DME wheelchair cushion (seat and back)  Once        10/15/21 1315   10/15/21 1315  For home use only DME Walker rolling  Once       Question Answer Comment  Walker: With [place] Wheels   Patient needs a walker to treat with the following condition Impaired ambulation      10/15/21 1314              Discharge Care Instructions  (From admission, onward)           Start     Ordered   10/15/21 0000  Discharge wound care:       Comments: As above   10/15/21 1203            DG Chest 2 View  Result Date:  10/15/2021 CLINICAL DATA:  Shortness of breath EXAM: CHEST - 2 VIEW COMPARISON:  10/11/2021 FINDINGS: No significant change in chest radiographs. Layering bilateral pleural effusions. Status post median sternotomy with mitral valve prosthesis, left atrial appendage clip, and left chest multi lead pacer. IMPRESSION: No significant change in chest radiographs. Layering bilateral pleural effusions. No new airspace opacity. Electronically Signed   By: Delanna Ahmadi  M.D.   On: 10/15/2021 11:50   DG Chest 2 View  Result Date: 10/10/2021 CLINICAL DATA:  Shortness of breath for several days EXAM: CHEST - 2 VIEW COMPARISON:  05/28/2021 FINDINGS: There is a left chest wall pacer device with leads in the coronary sinus and right ventricle. Status post left atrial clipping. Previous median sternotomy and CABG procedure. Moderate to large right pleural effusion and moderate left pleural effusion identified with mild interstitial edema. Decreased aeration to both lung bases noted favored to represent atelectasis. IMPRESSION: Imaging findings concerning for congestive heart failure. Electronically Signed   By: Kerby Moors M.D.   On: 10/10/2021 15:27   US Venous Img Lower Bilateral  Result Date: 10/10/2021 CLINICAL DATA:  Increasing lower extremity swelling left greater than right while off anticoagulation therapy. EXAM: BILATERAL LOWER EXTREMITY VENOUS DOPPLER ULTRASOUND TECHNIQUE: Gray-scale sonography with graded compression, as well as color Doppler and duplex ultrasound were performed to evaluate the lower extremity deep venous systems from the level of the common femoral vein and including the common femoral, femoral, profunda femoral, popliteal and calf veins including the posterior tibial, peroneal and gastrocnemius veins when visible. The superficial great saphenous vein was also interrogated. Spectral Doppler was utilized to evaluate flow at rest and with distal augmentation maneuvers in the common femoral,  femoral and popliteal veins. COMPARISON:  None. FINDINGS: RIGHT LOWER EXTREMITY Common Femoral Vein: No evidence of thrombus. Normal compressibility, respiratory phasicity and response to augmentation. Saphenofemoral Junction: No evidence of thrombus. Normal compressibility and flow on color Doppler imaging. Profunda Femoral Vein: No evidence of thrombus. Normal compressibility and flow on color Doppler imaging. Femoral Vein: No evidence of thrombus. Normal compressibility, respiratory phasicity and response to augmentation. Popliteal Vein: No evidence of thrombus. Normal compressibility, respiratory phasicity and response to augmentation. Calf Veins: No evidence of thrombus. Normal compressibility and flow on color Doppler imaging. Superficial Great Saphenous Vein: No evidence of thrombus. Normal compressibility. Venous Reflux:  None. Other Findings:  None. LEFT LOWER EXTREMITY Common Femoral Vein: No evidence of thrombus. Normal compressibility, respiratory phasicity and response to augmentation. Saphenofemoral Junction: No evidence of thrombus. Normal compressibility and flow on color Doppler imaging. Profunda Femoral Vein: No evidence of thrombus. Normal compressibility and flow on color Doppler imaging. Femoral Vein: No evidence of thrombus. Normal compressibility, respiratory phasicity and response to augmentation. Popliteal Vein: No evidence of thrombus. Normal compressibility, respiratory phasicity and response to augmentation. Calf Veins: No evidence of thrombus. Normal compressibility and flow on color Doppler imaging. Superficial Great Saphenous Vein: No evidence of thrombus. Normal compressibility. Venous Reflux:  None. Other Findings:  None. IMPRESSION: No evidence of deep venous thrombosis in either lower extremity. Electronically Signed   By: Inez Catalina M.D.   On: 10/10/2021 19:54   DG Chest Port 1 View  Result Date: 10/11/2021 CLINICAL DATA:  Status post thoracentesis EXAM: PORTABLE CHEST 1  VIEW COMPARISON:  Radiographs dated October 10, 2021 FINDINGS: The heart is enlarged. Sternotomy wires and mediastinal surgical clips are unchanged. Left access pacemaker with lead terminating in the right atrium, right ventricle and coronary sinus. Interval decrease in right-sided pleural effusion. Trace right pleural effusion and bibasilar atelectasis. IMPRESSION: 1.  Stable cardiomegaly. 2. Status post thoracentesis with interval decrease in right pleural effusion, right basilar atelectasis and trace bilateral pleural effusions are noted. Electronically Signed   By: Keane Police D.O.   On: 10/11/2021 12:27   US THORACENTESIS ASP PLEURAL SPACE W/IMG GUIDE  Result Date: 10/11/2021 INDICATION: Shortness of breath  with bilateral pleural effusions right greater than left request received for thoracentesis EXAM: ULTRASOUND GUIDED RIGHT THORACENTESIS MEDICATIONS: Local 1% lidocaine only. COMPLICATIONS: None immediate. PROCEDURE: An ultrasound guided thoracentesis was thoroughly discussed with the patient and questions answered. The benefits, risks, alternatives and complications were also discussed. The patient understands and wishes to proceed with the procedure. Written consent was obtained. Ultrasound was performed to localize and mark an adequate pocket of fluid in the right chest. The area was then prepped and draped in the normal sterile fashion. 1% Lidocaine was used for local anesthesia. Under ultrasound guidance a 19 gauge, 7-cm, Yueh catheter was introduced. Thoracentesis was performed. The catheter was removed and a dressing applied. FINDINGS: A total of approximately 1200 mL of clear yellow fluid was removed. Samples were sent to the laboratory as requested by the clinical team. IMPRESSION: Successful ultrasound guided right thoracentesis yielding 1200 mL of pleural fluid. Read By: Tsosie Billing PA-C Electronically Signed   By: Albin Felling M.D.   On: 10/11/2021 12:46   Results for orders placed or  performed during the hospital encounter of 10/10/21  Resp Panel by RT-PCR (Flu A&B, Covid) Nasopharyngeal Swab     Status: None   Collection Time: 10/10/21  8:10 PM   Specimen: Nasopharyngeal Swab; Nasopharyngeal(NP) swabs in vial transport medium  Result Value Ref Range Status   SARS Coronavirus 2 by RT PCR NEGATIVE NEGATIVE Final    Comment: (NOTE) SARS-CoV-2 target nucleic acids are NOT DETECTED.  The SARS-CoV-2 RNA is generally detectable in upper respiratory specimens during the acute phase of infection. The lowest concentration of SARS-CoV-2 viral copies this assay can detect is 138 copies/mL. A negative result does not preclude SARS-Cov-2 infection and should not be used as the sole basis for treatment or other patient management decisions. A negative result may occur with  improper specimen collection/handling, submission of specimen other than nasopharyngeal swab, presence of viral mutation(s) within the areas targeted by this assay, and inadequate number of viral copies(<138 copies/mL). A negative result must be combined with clinical observations, patient history, and epidemiological information. The expected result is Negative.  Fact Sheet for Patients:  EntrepreneurPulse.com.au  Fact Sheet for Healthcare Providers:  IncredibleEmployment.be  This test is no t yet approved or cleared by the [place] FDA and  has been authorized for detection and/or diagnosis of SARS-CoV-2 by FDA under an Emergency Use Authorization (EUA). This EUA will remain  in effect (meaning this test can be used) for the duration of the COVID-19 declaration under Section 564(b)(1) of the Act, 21 U.S.C.section 360bbb-3(b)(1), unless the authorization is terminated  or revoked sooner.       Influenza A by PCR NEGATIVE NEGATIVE Final   Influenza B by PCR NEGATIVE NEGATIVE Final    Comment: (NOTE) The Xpert Xpress SARS-CoV-2/FLU/RSV plus assay is intended as  an aid in the diagnosis of influenza from Nasopharyngeal swab specimens and should not be used as a sole basis for treatment. Nasal washings and aspirates are unacceptable for Xpert Xpress SARS-CoV-2/FLU/RSV testing.  Fact Sheet for Patients: EntrepreneurPulse.com.au  Fact Sheet for Healthcare Providers: IncredibleEmployment.be  This test is not yet approved or cleared by the [place] FDA and has been authorized for detection and/or diagnosis of SARS-CoV-2 by FDA under an Emergency Use Authorization (EUA). This EUA will remain in effect (meaning this test can be used) for the duration of the COVID-19 declaration under Section 564(b)(1) of the Act, 21 U.S.C. section 360bbb-3(b)(1), unless the authorization is terminated or  revoked.  Performed at [facility], [place], [place] [address]   Acid Fast Smear (AFB)     Status: None   Collection Time: 10/11/21 12:11 PM   Specimen: PATH Cytology Pleural fluid  Result Value Ref Range Status   AFB Specimen Processing Concentration  Final   Acid Fast Smear Negative  Final    Comment: (NOTE) Performed At: [facility] [place], [place] [id number] Rush Farmer MD YE:[id number]    Source (AFB) PLEURAL  Final    Comment: Performed at [facility], [address] [place], [place] [address]  Body fluid culture w Gram Stain     Status: None   Collection Time: 10/11/21 12:11 PM   Specimen: PATH Cytology Pleural fluid  Result Value Ref Range Status   Specimen Description   Final    PLEURAL Performed at [facility], [address] [place], [place] [address]    Special Requests   Final    NONE Performed at [facility], [address], [place], [place] [address]    Gram Stain NO ORGANISMS SEEN  Final   Culture   Final    NO GROWTH 3 DAYS Performed at [facility], [place] [address] [place], [place] [address]     Report Status 10/15/2021 FINAL  Final  MRSA Next Gen by PCR, Nasal     Status: Abnormal   Collection Time: 10/11/21  6:33 PM   Specimen: Nasal Mucosa; Nasal Swab  Result Value Ref Range Status   MRSA by PCR Next Gen DETECTED (A) NOT DETECTED Corrected    Comment: READ BACK AND VERIFIED BY Sabra Heck, RN AT [phone] 10/11/21 BY [facility] RESULT CALLED TO, READ BACK BY AND VERIFIED WITH: (NOTE) The GeneXpert MRSA Assay (FDA approved for NASAL specimens only), is one component of a comprehensive MRSA colonization surveillance program. It is not intended to diagnose MRSA infection nor to guide or monitor treatment for MRSA infections. Test performance is not FDA approved in patients less than 49 years old. Performed at [facility], [address] [place],  [address] CORRECTED ON 11/16 AT 0801: PREVIOUSLY REPORTED AS DETECTED READ BACK AND VERIFIED BY Sabra Heck, RN AT 3005 10/11/21 BY [facility]     Signed:  Max Sane MD.  [facility] 10/15/2021, 3:32 PM

## 2021-10-15 NOTE — Progress Notes (Signed)
Report called to [facility] at [phone]; spoke with receiving nurse CJ, LPN.  Discharge packet and medications from pharmacy given to EMS for delivery to facility.

## 2021-10-15 NOTE — Progress Notes (Signed)
[facility] Card[facility]  Patient Description: Kylie Thomas is a 70 year old female patient with PMH significant for multivessel CAD, s/p CABG x2 with atrial appendage ligation and MVR (on 07/25/2021) with an extensive hospital course that was complicated by post operative atrial fibrillation and CHB requiring PPM placement, bilateral pleural effusions and LUL segmental PE; HFpEF, acute PE (on eliquis), hypertension, diabetes mellitus type 2 and CKD stage III who was admitted for acute on chronic diastolic CHF and acute respiratory failure with hypoxia.  Cardiology consulted for CHF management.  SUBJECTIVE:  - Feels better today and is asking to go home.  - Concerned about recurrence of pleural effusion. - No significant shortness of breath or edema. NO chest pain.   OBJECTIVE:   Vitals:   10/14/21 2325 10/15/21 0303 10/15/21 0500 10/15/21 0738  BP: 116/72 (!) 103/55  (!) 107/50  Pulse: 73 80  81  Resp: 17 16  18   Temp: 98.7 F (37.1 C) 98.3 F (36.8 C)  97.7 F (36.5 C)  TempSrc:      SpO2: 99% 98%  92%  Weight:   43.9 kg   Height:         Intake/Output Summary (Last 24 hours) at 10/15/2021 0846 Last data filed at 10/15/2021 0500 Gross per 24 hour  Intake 720 ml  Output 1850 ml  Net -1130 ml       PHYSICAL EXAM  General: Well developed, well nourished, in no acute distress HEENT:  Normocephalic. Has staples to the occiput that are dry, intact and without signs of infection or hematoma.  Neck:  No JVD.  Lungs: diffuse crackles to auscultation at the bases of the lungs, clear to auscultation to the upper lobes.  Chest expansion symmetrical.  No wheezes, rales or rhonchi. Heart: HRRR . Normal S1 and S2 without gallops or murmurs.  Abdomen: Bowel sounds are positive, abdomen soft and non-tender  Msk:  Back normal, normal gait. Normal strength and tone for age. Extremities: No clubbing, cyanosis or edema.   Neuro: Alert and oriented X 3. Psych:  Good affect, responds  appropriately   LABS: Basic Metabolic Panel: Recent Labs    10/14/21 0542 10/15/21 0346  NA 132* 132*  K 3.5 3.4*  CL 93* 92*  CO2 30 33*  GLUCOSE 173* 153*  BUN 22 27*  CREATININE 0.85 0.98  CALCIUM 8.7* 8.7*    Liver Function Tests: No results for input(s): AST, ALT, ALKPHOS, BILITOT, PROT, ALBUMIN in the last 72 hours.  No results for input(s): LIPASE, AMYLASE in the last 72 hours. CBC: Recent Labs    10/13/21 0653 10/15/21 0346  WBC 8.2 8.5  NEUTROABS 6.2  --   HGB 10.2* 9.2*  HCT 34.1* 29.9*  MCV 95.5 93.1  PLT 320 313    Cardiac Enzymes: No results for input(s): CKTOTAL, CKMB, CKMBINDEX, TROPONINI in the last 72 hours. BNP: Invalid input(s): POCBNP D-Dimer: No results for input(s): DDIMER in the last 72 hours. Hemoglobin A1C: No results for input(s): HGBA1C in the last 72 hours. Fasting Lipid Panel: No results for input(s): CHOL, HDL, LDLCALC, TRIG, CHOLHDL, LDLDIRECT in the last 72 hours. Thyroid Function Tests: Recent Labs    10/15/21 0346  TSH 9.122*   Anemia Panel: No results for input(s): VITAMINB12, FOLATE, FERRITIN, TIBC, IRON, RETICCTPCT in the last 72 hours.  No results found.   Echo: performed 08/22/2021 at [facility] INTERPRETATION ---------------------------------------------------------------    MILD LV DYSFUNCTION (See above) WITH MILD LVH    NORMAL RIGHT VENTRICULAR SYSTOLIC FUNCTION  VALVULAR REGURGITATION: MILD AR, TRIVIAL MR, TRIVIAL PR, MILD TR    PROSTHETIC VALVE(S): BIOPROSTHETIC MV  Closest EF: 50% (Estimated)  Calc.EF: 50% (3D)   3D acquisition and reconstructions were performed as part of this    examination to more accurately quantify the effects of identified    structural abnormalities as part of the exam.    RHC: (performed 09/07/2021 at [facility] ) Impressions: 1. Normal/low right and left sided filling pressures  2. No pulmonary HTN  3. PA sat 58%, Art sat 93% (on 2L [place])  4. Preserved CO and CI (2.3 L/min/m2)    TELEMETRY: Ventricular Paced, HR of 85 bpm  ASSESSMENT AND PLAN:  Principal Problem:   Recurrent right pleural effusion Active Problems:   Acute on chronic combined systolic and diastolic CHF (congestive heart failure) (HCC)   Acute on chronic respiratory failure with hypoxia (HCC)   Type 2 diabetes mellitus uncontrolled with hyper and hypoglycemia with hyperlipidemia (HCC)   CKD (chronic kidney disease), stage IIIa   Atrial fibrillation, chronic (HCC)   Hyperglycemia due to type 2 diabetes mellitus (HCC)   S/P CABG (coronary artery bypass graft)   Chronic anticoagulation   Pacemaker   S/P mitral valve replacement   Anemia   Orthostatic hypotension   Pressure injury of skin    # Acute on chronic combined systolic and diastolic CHF  Patient's most recent echocardiogram from 07/2021 at [facility] revealed mild LV systolic function with mild LVH, an estimated EF of 50%, mild AR and mild TR with bioprosthetic MV.  Right heart cath from 08/2021 revealed no evidence of pulmonary hypertension, low normal right and left-sided filling pressures and preserved CO and CI. Patient appears euvolemic on today.   Recommendations:  -Continue diuresis with PO lasix 40 mg  -Continue to hold beta-blocker, MRA and ARB at this time due to hypotension. Consider restarting as outpatient.   -Continue CHF management per protocol with daily weights, low-sodium diet and strict I's and O's.  # Paroxsymal atrial fibrillation # s/p left appendage ligation # CHB s/p PPM  Patient continues to be V-paced on the monitor with a controlled heart rate. Was restarted on eliquis yesterday without any s/s of abnormal bleeding overnight.  -Continue Eliquis for anticoagulation in a patient with a CHA2DS2-VASc score of 6.  - Monitor closely for s/s of abnormal bleeding.   -Beta-blocker held as above.   # Coronary artery disease s/p CABG x2 with MVR # Right-sided pleural effusions, recurrent Patient had an complicated  hospital course s/p CABGx2 with left atrial appendage ligation and MVR on 07/25/2021 with related complications of postop atrial fibrillation, complete heart block requiring PPM placement, bilateral pleural effusions and LUL segmental PE that was treated with the insertion of bilateral pigtails and aggressive diuresis. During this current admission the patient was found to have recurent Right-side pleural effusion and underwent ultrasound-guided thoracentesis on 10/11/2021 which yielded 1200 cc of pleural fluid.  -Continue ASA and statin therapy for CAD management.  -Beta-blocker held as per above.  -ARB held as above.  -CXR today shows persistent effusion   #Acute respiratory failure with hypoxia Patient currently on room air with oxygen saturations greater than 93%.  -Agree with current management.  #Orthostatic hypotension # Diabetes mellitus type 2 Patient's blood pressure has been between around [id number] systolic over the last 24 hours.   -Agree with midodrine therapy.   - antihypertensive held as above.    # CKD stage IIIa # Anemia  BUN/creatinine has improved since  admission.  H&H stable at 10.2/34.1 at this time.  -Agree with current management per primary team.   Andrez Grime, MD 10/15/2021 8:46 AM

## 2021-10-15 NOTE — TOC Transition Note (Signed)
Transition of Care [facility]) - CM/SW Discharge Note   Patient Details  Name: Kylie Thomas MRN: [id number] Date of Birth: Nov 08, 1951  Transition of Care [facility]) CM/SW Contact:  Gildardo Griffes, LCSW Phone Number: 10/15/2021, 12:58 PM   Clinical Narrative:     Patient to discharge to [facility] today, all paperwork has been faxed to [facility] at [phone].   Per Elouise Munroe with [facility], patient's daughter to provide paperwork and check to business office by 4:30 pm.   CSW to arrange EMS transport for 4:30 pm. EMS forms on chart.   [facility] has requested patient receive PT OT and RN services, patient is set up with [facility] for home health services. Home Health orders have also been faxed to [facility].    Final next level of care: Assisted Living Barriers to Discharge: No Barriers Identified   Patient Goals and CMS Choice Patient states their goals for this hospitalization and ongoing recovery are:: to go home CMS Medicare.gov Compare Post Acute Care list provided to:: Patient Represenative (must comment) (daugher Marchelle Folks) Choice offered to / list presented to : Adult Children  Discharge Placement                Patient to be transferred to facility by: [facility]   Patient and family notified of of transfer: 10/15/21  Discharge Plan and Services                          HH Arranged: PT, OT, RN [facility] Agency: [facility] Date [facility] Agency Contacted: 10/15/21 Time HH Agency Contacted: 1230 Representative spoke with at [facility] Agency: Kandee Keen  Social Determinants of Health (SDOH) Interventions     Readmission Risk Interventions No flowsheet data found.

## 2021-10-15 NOTE — TOC Progression Note (Addendum)
Transition of Care [facility]) - Progression Note    Patient Details  Name: MIAROSE LIPPERT MRN: [id number] Date of Birth: [birth year]  Transition of Care [facility]) CM/SW Contact  Gildardo Griffes, [place] Phone Number: 10/15/2021, 1:41 PM  Clinical Narrative:     Update: Patient to dc to [facility].  Room 704 RN to call report to [phone] Patient to dc via [facility] at 5:30 pm Patient's daughter updated    Patietn's daughter called CSW and informed insurance called her and upheld her appeal for snf. Patient has been approved. Insurance also called CSW to inform.   At this time patient's daughter now wants patient to go to [facility] for SNF and not [facility] since that was back up plan due to peer to peer denial.   CSW has reached out to [place] with [facility], no response. CSW has called facility directly and asked who was working for admissions today, the answer was "I have no idea".   CSW has reached out to weekend supervisor Lafonda Mosses to inquire as to assistance in getting ahold of someone to admit patient to [facility] today.   Insurance Berkley Harvey number is: [phone]     Expected Discharge Plan: Skilled Nursing Facility Barriers to Discharge: No Barriers Identified  Expected Discharge Plan and Services Expected Discharge Plan: Skilled Nursing Facility       Living arrangements for the past 2 months: Skilled Nursing Facility Expected Discharge Date: 10/15/21                         HH Arranged: PT, OT, RN HH Agency: [facility] Date [facility] Agency Contacted: 10/15/21 Time HH Agency Contacted: 1230 Representative spoke with at [facility] Agency: Kandee Keen   Social Determinants of Health (SDOH) Interventions    Readmission Risk Interventions No flowsheet data found.

## 2021-10-15 NOTE — Progress Notes (Signed)
Pt voiced relief and pain is now 1-2. Supplemental O2 decreased to 1L while resting and pt agreed to keep on overnight.

## 2021-10-16 LAB — SARS CORONAVIRUS 2 (TAT 6-24 HRS): SARS Coronavirus 2: NEGATIVE

## 2021-10-17 ENCOUNTER — Ambulatory Visit: Payer: Medicare PPO | Admitting: Podiatry

## 2021-10-23 NOTE — Progress Notes (Incomplete)
[facility] Cardiology    SUBJECTIVE: ***   Vitals:   10/15/21 0303 10/15/21 0500 10/15/21 0738 10/15/21 1454  BP: (!) 103/55  (!) 107/50 (!) 90/39  Pulse: 80  81 77  Resp: 16  18 17   Temp: 98.3 F (36.8 C)  97.7 F (36.5 C) 97.6 F (36.4 C)  TempSrc:      SpO2: 98%  92% 100%  Weight:  43.9 kg    Height:        No intake or output data in the 24 hours ending 10/23/21 1156    PHYSICAL EXAM  General: Well developed, well nourished, in no acute distress HEENT:  Normocephalic and atramatic Neck:  No JVD.  Lungs: Clear bilaterally to auscultation and percussion. Heart: HRRR . Normal S1 and S2 without gallops or murmurs.  Abdomen: Bowel sounds are positive, abdomen soft and non-tender  Msk:  Back normal, normal gait. Normal strength and tone for age. Extremities: No clubbing, cyanosis or edema.   Neuro: Alert and oriented X 3. Psych:  Good affect, responds appropriately   LABS: Basic Metabolic Panel: No results for input(s): NA, K, CL, CO2, GLUCOSE, BUN, CREATININE, CALCIUM, MG, PHOS in the last 72 hours. Liver Function Tests: No results for input(s): AST, ALT, ALKPHOS, BILITOT, PROT, ALBUMIN in the last 72 hours. No results for input(s): LIPASE, AMYLASE in the last 72 hours. CBC: No results for input(s): WBC, NEUTROABS, HGB, HCT, MCV, PLT in the last 72 hours. Cardiac Enzymes: No results for input(s): CKTOTAL, CKMB, CKMBINDEX, TROPONINI in the last 72 hours. BNP: Invalid input(s): POCBNP D-Dimer: No results for input(s): DDIMER in the last 72 hours. Hemoglobin A1C: No results for input(s): HGBA1C in the last 72 hours. Fasting Lipid Panel: No results for input(s): CHOL, HDL, LDLCALC, TRIG, CHOLHDL, LDLDIRECT in the last 72 hours. Thyroid Function Tests: No results for input(s): TSH, T4TOTAL, T3FREE, THYROIDAB in the last 72 hours.  Invalid input(s): FREET3 Anemia Panel: No results for input(s): VITAMINB12, FOLATE, FERRITIN, TIBC, IRON, RETICCTPCT in the last 72  hours.  No results found.   Echo ***  TELEMETRY: ***:  ASSESSMENT AND PLAN:  Principal Problem:   Recurrent right pleural effusion Active Problems:   Acute on chronic combined systolic and diastolic CHF (congestive heart failure) (HCC)   Acute on chronic respiratory failure with hypoxia (HCC)   Type 2 diabetes mellitus uncontrolled with hyper and hypoglycemia with hyperlipidemia (HCC)   CKD (chronic kidney disease), stage IIIa   Atrial fibrillation, chronic (HCC)   Hyperglycemia due to type 2 diabetes mellitus (HCC)   S/P CABG (coronary artery bypass graft)   Chronic anticoagulation   Pacemaker   S/P mitral valve replacement   Anemia   Orthostatic hypotension   Pressure injury of skin    1. ***   10/25/21, MD 10/23/2021 11:56 AM

## 2021-10-23 NOTE — Consult Note (Incomplete)
CARDIOLOGY CONSULT NOTE               Patient ID: Kylie Thomas MRN: [id number] DOB/AGE: 1951-06-14 70 y.o.  Admit date: 10/10/2021 Referring Physician *** Primary Physician *** Primary Cardiologist *** Reason for Consultation ***  HPI: ***  Review of systems complete and found to be negative unless listed above     Past Medical History:  Diagnosis Date   Anxiety    Arrhythmia    Atrial fibrillation (HCC)    CHF (congestive heart failure) (HCC)    Coronary artery disease    Depression    Diabetes mellitus without complication (HCC)    GERD (gastroesophageal reflux disease)    Hyperlipidemia    Hypertension     Past Surgical History:  Procedure Laterality Date   BELPHAROPTOSIS REPAIR     CATARACT EXTRACTION, BILATERAL     CESAREAN SECTION     LEFT HEART CATH AND CORONARY ANGIOGRAPHY N/A 05/18/2021   Procedure: LEFT HEART CATH AND CORONARY ANGIOGRAPHY;  Surgeon: Lamar Blinks, MD;  Location: [facility] INVASIVE CV LAB;  Service: Cardiovascular;  Laterality: N/A;   TRIGGER FINGER RELEASE     x 7    No medications prior to admission.   Social History   Socioeconomic History   Marital status: Married    Spouse name: Not on file   Number of children: Not on file   Years of education: Not on file   Highest education level: Not on file  Occupational History   Not on file  Tobacco Use   Smoking status: Never   Smokeless tobacco: Never  Vaping Use   Vaping Use: Never used  Substance and Sexual Activity   Alcohol use: Not Currently   Drug use: Not Currently   Sexual activity: Not on file  Other Topics Concern   Not on file  Social History Narrative   Not on file   Social Determinants of Health   Financial Resource Strain: Not on file  Food Insecurity: Not on file  Transportation Needs: Not on file  Physical Activity: Not on file  Stress: Not on file  Social Connections: Not on file  Intimate Partner Violence: Not on file    Family History   Problem Relation Age of Onset   Heart disease Mother    Diabetes Father    Heart attack Father       Review of systems complete and found to be negative unless listed above      PHYSICAL EXAM  General: Well developed, well nourished, in no acute distress HEENT:  Normocephalic and atramatic Neck:  No JVD.  Lungs: Clear bilaterally to auscultation and percussion. Heart: HRRR . Normal S1 and S2 without gallops or murmurs.  Abdomen: Bowel sounds are positive, abdomen soft and non-tender  Msk:  Back normal, normal gait. Normal strength and tone for age. Extremities: No clubbing, cyanosis or edema.   Neuro: Alert and oriented X 3. Psych:  Good affect, responds appropriately  Labs:   Lab Results  Component Value Date   WBC 8.5 10/15/2021   HGB 9.2 (L) 10/15/2021   HCT 29.9 (L) 10/15/2021   MCV 93.1 10/15/2021   PLT 313 10/15/2021   No results for input(s): NA, K, CL, CO2, BUN, CREATININE, CALCIUM, PROT, BILITOT, ALKPHOS, ALT, AST, GLUCOSE in the last 168 hours.  Invalid input(s): LABALBU Lab Results  Component Value Date   TROPONINI <0.03 02/16/2019    Lab Results  Component Value Date   CHOL  95 05/16/2021   Lab Results  Component Value Date   HDL 40 (L) 05/16/2021   Lab Results  Component Value Date   LDLCALC 45 05/16/2021   Lab Results  Component Value Date   TRIG 52 05/16/2021   Lab Results  Component Value Date   CHOLHDL 2.4 05/16/2021   No results found for: LDLDIRECT    Radiology: DG Chest 2 View  Result Date: 10/15/2021 CLINICAL DATA:  Shortness of breath EXAM: CHEST - 2 VIEW COMPARISON:  10/11/2021 FINDINGS: No significant change in chest radiographs. Layering bilateral pleural effusions. Status post median sternotomy with mitral valve prosthesis, left atrial appendage clip, and left chest multi lead pacer. IMPRESSION: No significant change in chest radiographs. Layering bilateral pleural effusions. No new airspace opacity. Electronically Signed    By: Delanna Ahmadi M.D.   On: 10/15/2021 11:50   DG Chest 2 View  Result Date: 10/10/2021 CLINICAL DATA:  Shortness of breath for several days EXAM: CHEST - 2 VIEW COMPARISON:  05/28/2021 FINDINGS: There is a left chest wall pacer device with leads in the coronary sinus and right ventricle. Status post left atrial clipping. Previous median sternotomy and CABG procedure. Moderate to large right pleural effusion and moderate left pleural effusion identified with mild interstitial edema. Decreased aeration to both lung bases noted favored to represent atelectasis. IMPRESSION: Imaging findings concerning for congestive heart failure. Electronically Signed   By: Kerby Moors M.D.   On: 10/10/2021 15:27   US Venous Img Lower Bilateral  Result Date: 10/10/2021 CLINICAL DATA:  Increasing lower extremity swelling left greater than right while off anticoagulation therapy. EXAM: BILATERAL LOWER EXTREMITY VENOUS DOPPLER ULTRASOUND TECHNIQUE: Gray-scale sonography with graded compression, as well as color Doppler and duplex ultrasound were performed to evaluate the lower extremity deep venous systems from the level of the common femoral vein and including the common femoral, femoral, profunda femoral, popliteal and calf veins including the posterior tibial, peroneal and gastrocnemius veins when visible. The superficial great saphenous vein was also interrogated. Spectral Doppler was utilized to evaluate flow at rest and with distal augmentation maneuvers in the common femoral, femoral and popliteal veins. COMPARISON:  None. FINDINGS: RIGHT LOWER EXTREMITY Common Femoral Vein: No evidence of thrombus. Normal compressibility, respiratory phasicity and response to augmentation. Saphenofemoral Junction: No evidence of thrombus. Normal compressibility and flow on color Doppler imaging. Profunda Femoral Vein: No evidence of thrombus. Normal compressibility and flow on color Doppler imaging. Femoral Vein: No evidence of  thrombus. Normal compressibility, respiratory phasicity and response to augmentation. Popliteal Vein: No evidence of thrombus. Normal compressibility, respiratory phasicity and response to augmentation. Calf Veins: No evidence of thrombus. Normal compressibility and flow on color Doppler imaging. Superficial Great Saphenous Vein: No evidence of thrombus. Normal compressibility. Venous Reflux:  None. Other Findings:  None. LEFT LOWER EXTREMITY Common Femoral Vein: No evidence of thrombus. Normal compressibility, respiratory phasicity and response to augmentation. Saphenofemoral Junction: No evidence of thrombus. Normal compressibility and flow on color Doppler imaging. Profunda Femoral Vein: No evidence of thrombus. Normal compressibility and flow on color Doppler imaging. Femoral Vein: No evidence of thrombus. Normal compressibility, respiratory phasicity and response to augmentation. Popliteal Vein: No evidence of thrombus. Normal compressibility, respiratory phasicity and response to augmentation. Calf Veins: No evidence of thrombus. Normal compressibility and flow on color Doppler imaging. Superficial Great Saphenous Vein: No evidence of thrombus. Normal compressibility. Venous Reflux:  None. Other Findings:  None. IMPRESSION: No evidence of deep venous thrombosis in either lower extremity. Electronically  Signed   By: Inez Catalina M.D.   On: 10/10/2021 19:54   DG Chest Port 1 View  Result Date: 10/11/2021 CLINICAL DATA:  Status post thoracentesis EXAM: PORTABLE CHEST 1 VIEW COMPARISON:  Radiographs dated October 10, 2021 FINDINGS: The heart is enlarged. Sternotomy wires and mediastinal surgical clips are unchanged. Left access pacemaker with lead terminating in the right atrium, right ventricle and coronary sinus. Interval decrease in right-sided pleural effusion. Trace right pleural effusion and bibasilar atelectasis. IMPRESSION: 1.  Stable cardiomegaly. 2. Status post thoracentesis with interval decrease  in right pleural effusion, right basilar atelectasis and trace bilateral pleural effusions are noted. Electronically Signed   By: Keane Police D.O.   On: 10/11/2021 12:27   US THORACENTESIS ASP PLEURAL SPACE W/IMG GUIDE  Result Date: 10/11/2021 INDICATION: Shortness of breath with bilateral pleural effusions right greater than left request received for thoracentesis EXAM: ULTRASOUND GUIDED RIGHT THORACENTESIS MEDICATIONS: Local 1% lidocaine only. COMPLICATIONS: None immediate. PROCEDURE: An ultrasound guided thoracentesis was thoroughly discussed with the patient and questions answered. The benefits, risks, alternatives and complications were also discussed. The patient understands and wishes to proceed with the procedure. Written consent was obtained. Ultrasound was performed to localize and mark an adequate pocket of fluid in the right chest. The area was then prepped and draped in the normal sterile fashion. 1% Lidocaine was used for local anesthesia. Under ultrasound guidance a 19 gauge, 7-cm, Yueh catheter was introduced. Thoracentesis was performed. The catheter was removed and a dressing applied. FINDINGS: A total of approximately 1200 mL of clear yellow fluid was removed. Samples were sent to the laboratory as requested by the clinical team. IMPRESSION: Successful ultrasound guided right thoracentesis yielding 1200 mL of pleural fluid. Read By: Tsosie Billing PA-C Electronically Signed   By: Albin Felling M.D.   On: 10/11/2021 12:46    EKG: ***  ASSESSMENT AND PLAN:  ***  Signed: Yolonda Kida MD 10/23/2021, 11:55 AM

## 2021-10-26 ENCOUNTER — Ambulatory Visit: Payer: Medicare PPO | Admitting: Family

## 2021-10-27 DEATH — deceased

## 2021-11-07 IMAGING — DX DG CHEST 1V
1 series · 1 of 1 positions shown · non-contrast
Comparison: Chest radiographs 02/15/2006.

CLINICAL DATA: 69-year-old female with shortness of breath and
respiratory distress.

EXAM:
CHEST  1 VIEW

[chest ap]
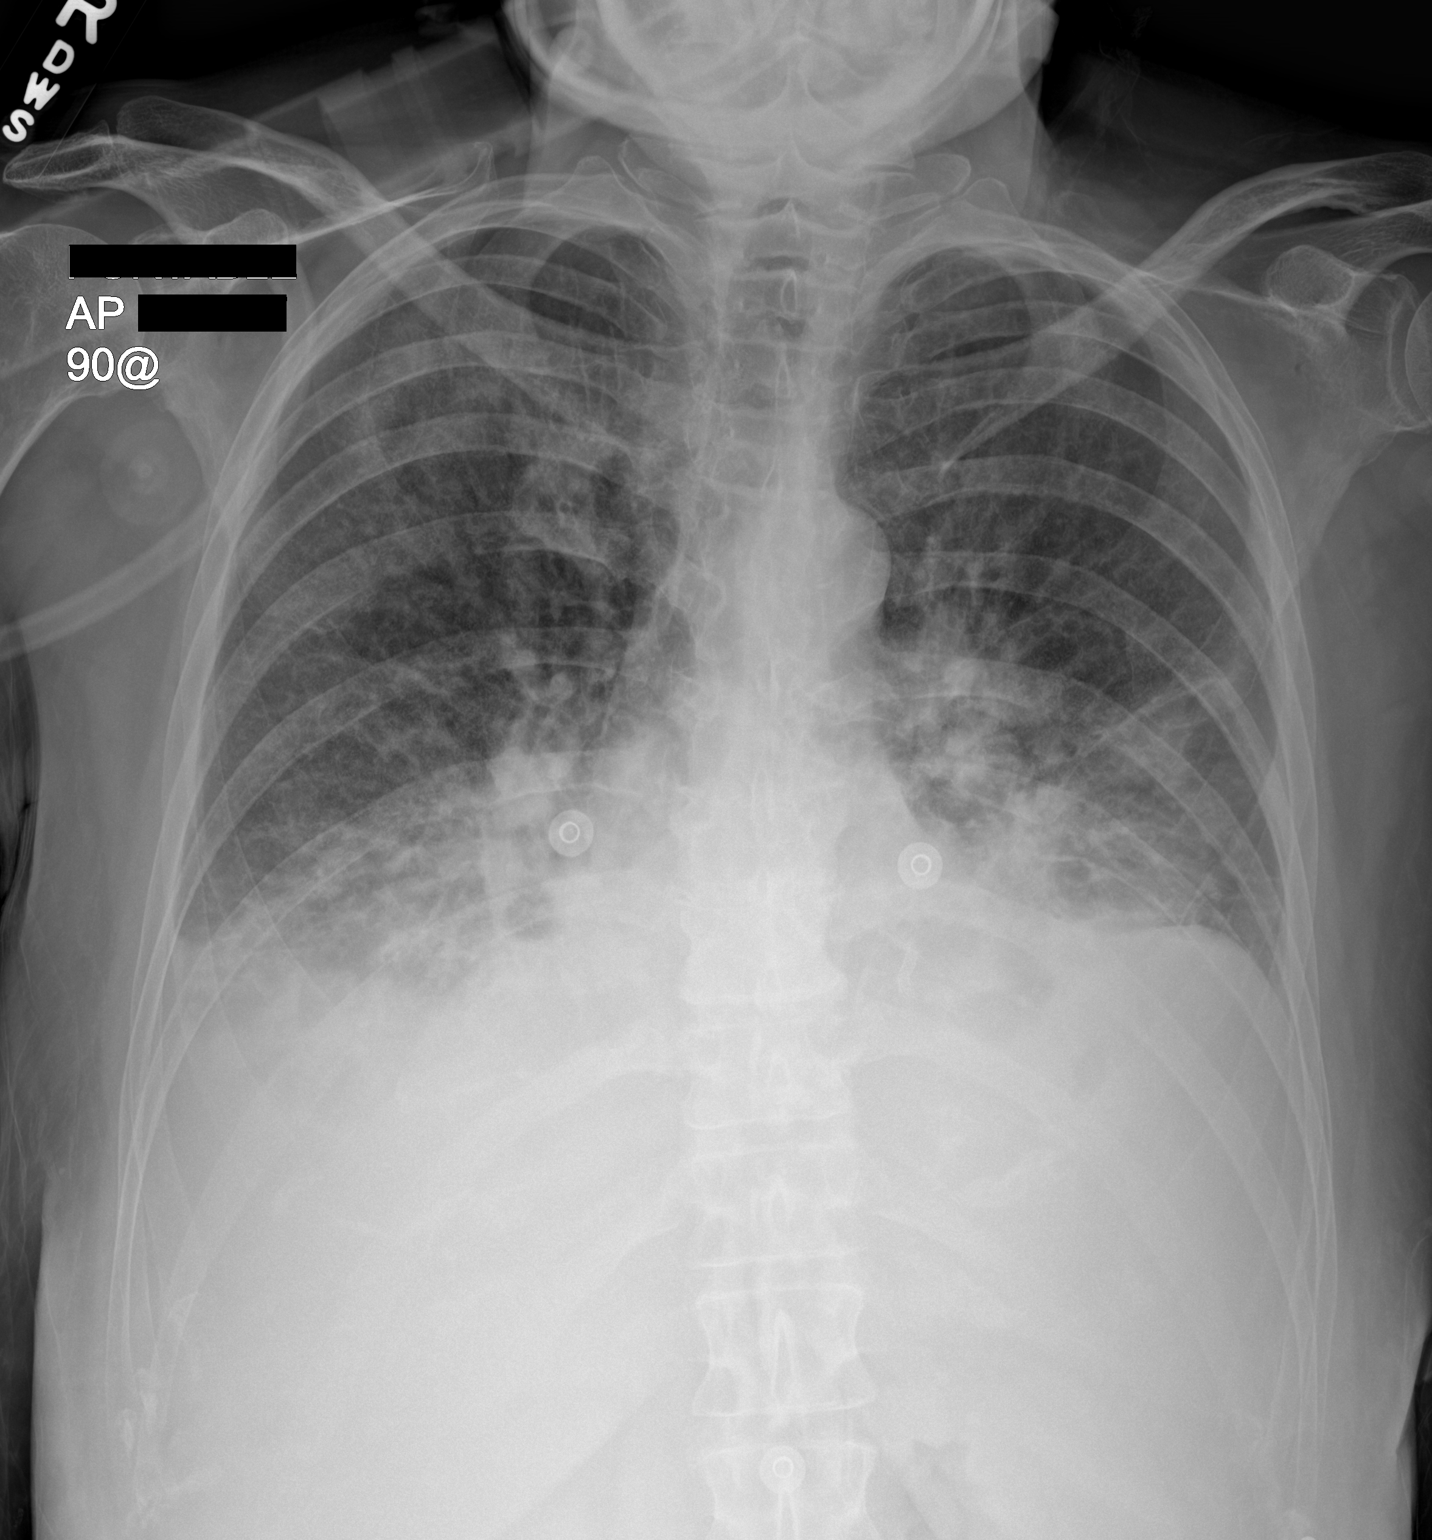

[1 of 1 positions shown; findings below may reference images not displayed]

FINDINGS: Portable AP upright view at 2422 hours. Similar lung volumes, and
visible mediastinal contours appear normal. But there is diffuse
increased pulmonary interstitial opacity, patchy bilateral perihilar
opacity and confluent bibasilar opacity which most resembles pleural
effusions. No pneumothorax or air bronchograms. Visualized tracheal
air column is within normal limits. Paucity of bowel gas in the
upper abdomen. No acute osseous abnormality identified.
IMPRESSION: Abnormal lungs most suggestive of acute pulmonary edema with
bilateral pleural effusions and atelectasis.

Bilateral infection with pleural effusions felt less likely.

## 2021-11-28 LAB — ACID FAST CULTURE WITH REFLEXED SENSITIVITIES (MYCOBACTERIA): Acid Fast Culture: NEGATIVE

## 2021-12-21 LAB — MISC LABCORP TEST (SEND OUT)
LabCorp test name: 5367
Labcorp test code: 9985

## 2022-04-04 IMAGING — CR DG CHEST 2V
2 series · 2 of 2 positions shown · non-contrast
Comparison: 05/28/2021

CLINICAL DATA: Shortness of breath for several days

EXAM:
CHEST - 2 VIEW

[chest lat]
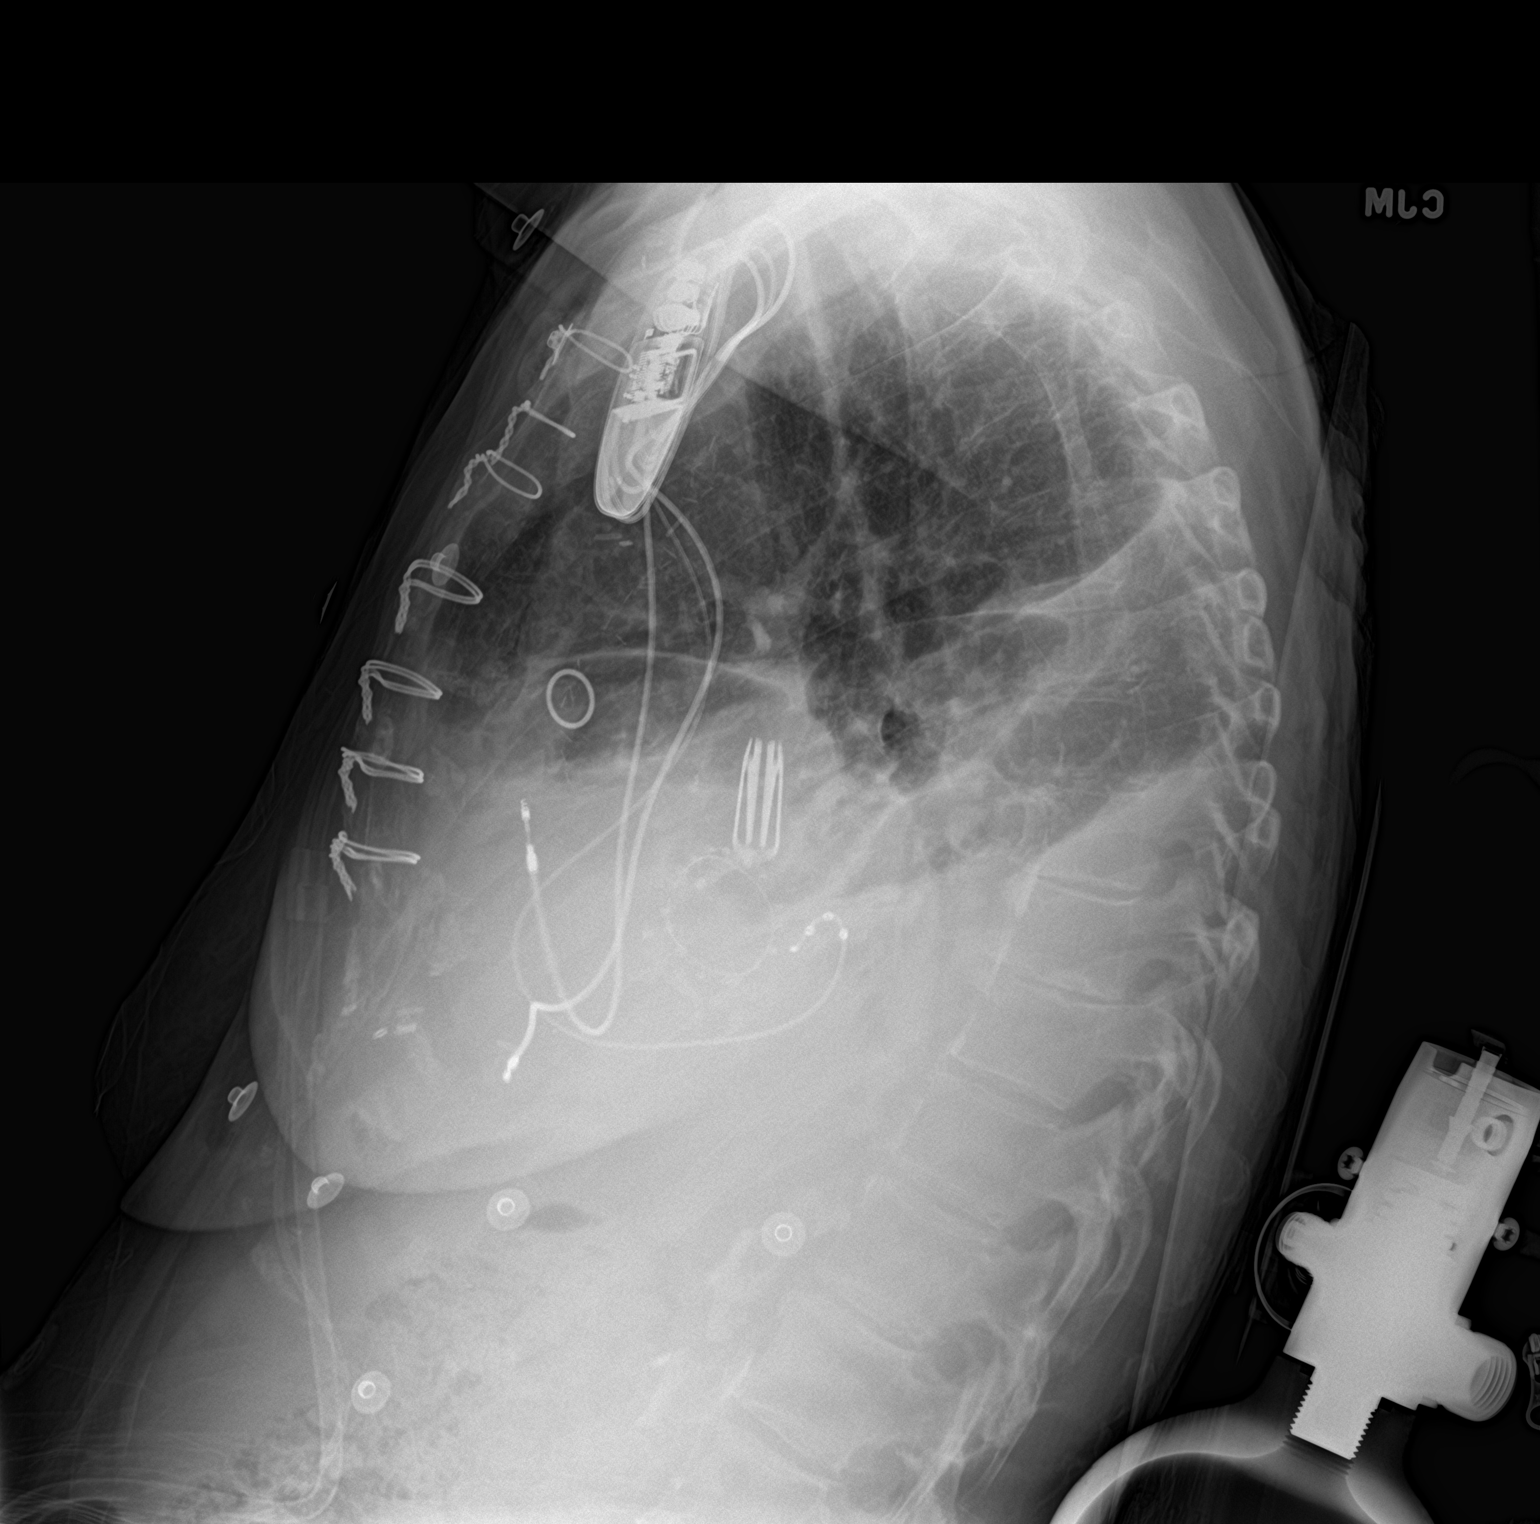

[chest ap]
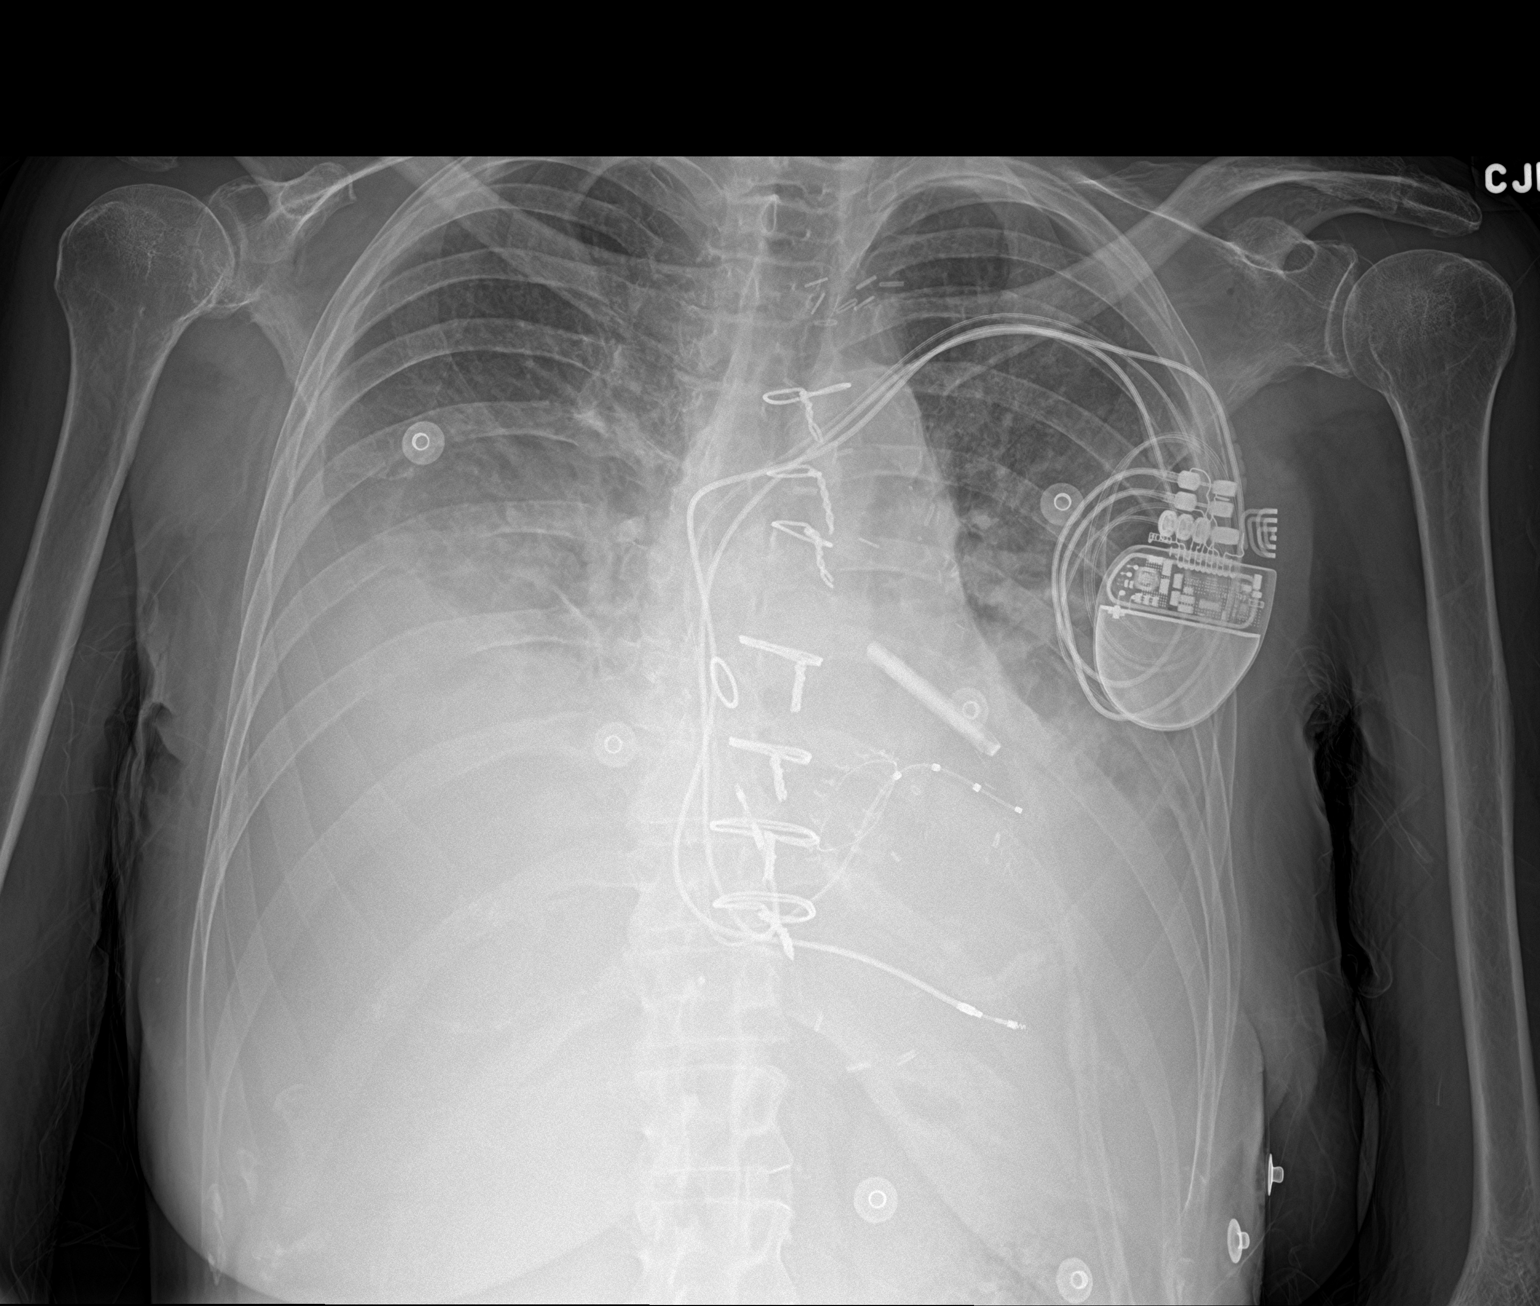

[2 of 2 positions shown; findings below may reference images not displayed]

FINDINGS: There is a left chest wall pacer device with leads in the coronary
sinus and right ventricle. Status post left atrial clipping.
Previous median sternotomy and CABG procedure. Moderate to large
right pleural effusion and moderate left pleural effusion identified
with mild interstitial edema. Decreased aeration to both lung bases
noted favored to represent atelectasis.
IMPRESSION: Imaging findings concerning for congestive heart failure.
